# Patient Record
Sex: Female | Born: 1985 | Race: White | Hispanic: No | Marital: Married | Smoking: Current every day smoker
Health system: Southern US, Community
[De-identification: ages and names within clinical notes are randomized; demographics above are authoritative.]

## PROBLEM LIST (undated history)

## (undated) ENCOUNTER — Inpatient Hospital Stay: Payer: Self-pay

## (undated) DIAGNOSIS — K219 Gastro-esophageal reflux disease without esophagitis: Secondary | ICD-10-CM

## (undated) DIAGNOSIS — F419 Anxiety disorder, unspecified: Secondary | ICD-10-CM

## (undated) DIAGNOSIS — F319 Bipolar disorder, unspecified: Secondary | ICD-10-CM

## (undated) DIAGNOSIS — M549 Dorsalgia, unspecified: Secondary | ICD-10-CM

## (undated) DIAGNOSIS — I10 Essential (primary) hypertension: Secondary | ICD-10-CM

## (undated) DIAGNOSIS — I2089 Other forms of angina pectoris: Secondary | ICD-10-CM

## (undated) DIAGNOSIS — I341 Nonrheumatic mitral (valve) prolapse: Secondary | ICD-10-CM

## (undated) DIAGNOSIS — G8929 Other chronic pain: Secondary | ICD-10-CM

## (undated) DIAGNOSIS — I208 Other forms of angina pectoris: Secondary | ICD-10-CM

## (undated) HISTORY — PX: BACK SURGERY: SHX140

## (undated) HISTORY — PX: APPENDECTOMY: SHX54

## (undated) HISTORY — PX: KNEE ARTHROPLASTY: SHX992

## (undated) HISTORY — PX: POLYPECTOMY: SHX149

## (undated) HISTORY — PX: TONSILLECTOMY: SUR1361

---

## 2011-11-27 DIAGNOSIS — F1210 Cannabis abuse, uncomplicated: Secondary | ICD-10-CM | POA: Insufficient documentation

## 2012-07-11 DIAGNOSIS — M47812 Spondylosis without myelopathy or radiculopathy, cervical region: Secondary | ICD-10-CM | POA: Insufficient documentation

## 2012-07-11 DIAGNOSIS — M47814 Spondylosis without myelopathy or radiculopathy, thoracic region: Secondary | ICD-10-CM | POA: Insufficient documentation

## 2013-03-17 DIAGNOSIS — F418 Other specified anxiety disorders: Secondary | ICD-10-CM | POA: Insufficient documentation

## 2013-03-17 DIAGNOSIS — G43109 Migraine with aura, not intractable, without status migrainosus: Secondary | ICD-10-CM | POA: Insufficient documentation

## 2013-03-17 DIAGNOSIS — I10 Essential (primary) hypertension: Secondary | ICD-10-CM | POA: Insufficient documentation

## 2014-06-22 DIAGNOSIS — F29 Unspecified psychosis not due to a substance or known physiological condition: Secondary | ICD-10-CM | POA: Insufficient documentation

## 2014-06-22 DIAGNOSIS — F1120 Opioid dependence, uncomplicated: Secondary | ICD-10-CM | POA: Insufficient documentation

## 2015-01-16 DIAGNOSIS — F603 Borderline personality disorder: Secondary | ICD-10-CM | POA: Insufficient documentation

## 2015-03-08 DIAGNOSIS — M5442 Lumbago with sciatica, left side: Secondary | ICD-10-CM

## 2015-03-08 DIAGNOSIS — M5441 Lumbago with sciatica, right side: Secondary | ICD-10-CM

## 2015-03-08 DIAGNOSIS — G8929 Other chronic pain: Secondary | ICD-10-CM | POA: Insufficient documentation

## 2015-04-09 DIAGNOSIS — G894 Chronic pain syndrome: Secondary | ICD-10-CM | POA: Insufficient documentation

## 2015-04-09 DIAGNOSIS — F1120 Opioid dependence, uncomplicated: Secondary | ICD-10-CM | POA: Insufficient documentation

## 2015-04-09 DIAGNOSIS — F449 Dissociative and conversion disorder, unspecified: Secondary | ICD-10-CM | POA: Insufficient documentation

## 2015-04-09 DIAGNOSIS — F39 Unspecified mood [affective] disorder: Secondary | ICD-10-CM | POA: Insufficient documentation

## 2015-04-10 DIAGNOSIS — F17210 Nicotine dependence, cigarettes, uncomplicated: Secondary | ICD-10-CM | POA: Insufficient documentation

## 2015-04-10 DIAGNOSIS — Z8679 Personal history of other diseases of the circulatory system: Secondary | ICD-10-CM | POA: Insufficient documentation

## 2015-04-10 DIAGNOSIS — R000 Tachycardia, unspecified: Secondary | ICD-10-CM | POA: Insufficient documentation

## 2015-04-10 DIAGNOSIS — T404X2A Poisoning by other synthetic narcotics, intentional self-harm, initial encounter: Secondary | ICD-10-CM

## 2015-04-10 DIAGNOSIS — R45851 Suicidal ideations: Secondary | ICD-10-CM | POA: Insufficient documentation

## 2015-04-10 DIAGNOSIS — T40412A Poisoning by fentanyl or fentanyl analogs, intentional self-harm, initial encounter: Secondary | ICD-10-CM | POA: Insufficient documentation

## 2015-05-15 DIAGNOSIS — R404 Transient alteration of awareness: Secondary | ICD-10-CM | POA: Insufficient documentation

## 2015-08-08 DIAGNOSIS — M961 Postlaminectomy syndrome, not elsewhere classified: Secondary | ICD-10-CM | POA: Insufficient documentation

## 2015-09-03 DIAGNOSIS — F319 Bipolar disorder, unspecified: Secondary | ICD-10-CM | POA: Insufficient documentation

## 2015-10-04 DIAGNOSIS — R0789 Other chest pain: Secondary | ICD-10-CM | POA: Insufficient documentation

## 2016-01-16 DIAGNOSIS — J392 Other diseases of pharynx: Secondary | ICD-10-CM | POA: Insufficient documentation

## 2016-01-16 DIAGNOSIS — G8929 Other chronic pain: Secondary | ICD-10-CM | POA: Insufficient documentation

## 2016-01-16 DIAGNOSIS — R6884 Jaw pain: Secondary | ICD-10-CM

## 2016-06-19 DIAGNOSIS — M545 Low back pain, unspecified: Secondary | ICD-10-CM | POA: Insufficient documentation

## 2016-07-14 ENCOUNTER — Emergency Department
Admission: EM | Admit: 2016-07-14 | Discharge: 2016-07-14 | Disposition: A | Discharge status: Home / Self Care | Payer: Managed Care, Other (non HMO) | Attending: Student in an Organized Health Care Education/Training Program | Admitting: Student in an Organized Health Care Education/Training Program

## 2016-07-14 ENCOUNTER — Emergency Department: Payer: Managed Care, Other (non HMO)

## 2016-07-14 DIAGNOSIS — F17200 Nicotine dependence, unspecified, uncomplicated: Secondary | ICD-10-CM | POA: Insufficient documentation

## 2016-07-14 DIAGNOSIS — R079 Chest pain, unspecified: Secondary | ICD-10-CM | POA: Insufficient documentation

## 2016-07-14 DIAGNOSIS — I10 Essential (primary) hypertension: Secondary | ICD-10-CM | POA: Insufficient documentation

## 2016-07-14 HISTORY — DX: Essential (primary) hypertension: I10

## 2016-07-14 HISTORY — DX: Nonrheumatic mitral (valve) prolapse: I34.1

## 2016-07-14 HISTORY — DX: Other forms of angina pectoris: I20.8

## 2016-07-14 HISTORY — DX: Other forms of angina pectoris: I20.89

## 2016-07-14 LAB — CBC
HCT: 40.8 % (ref 35.0–47.0)
Hemoglobin: 14.2 g/dL (ref 12.0–16.0)
MCH: 31 pg (ref 26.0–34.0)
MCHC: 34.8 g/dL (ref 32.0–36.0)
MCV: 89 fL (ref 80.0–100.0)
PLATELETS: 341 10*3/uL (ref 150–440)
RBC: 4.58 MIL/uL (ref 3.80–5.20)
RDW: 14.6 % — AB (ref 11.5–14.5)
WBC: 11.4 10*3/uL — ABNORMAL HIGH (ref 3.6–11.0)

## 2016-07-14 LAB — BASIC METABOLIC PANEL
ANION GAP: 9 (ref 5–15)
BUN: 5 mg/dL — AB (ref 6–20)
CALCIUM: 9.5 mg/dL (ref 8.9–10.3)
CO2: 21 mmol/L — ABNORMAL LOW (ref 22–32)
CREATININE: 0.78 mg/dL (ref 0.44–1.00)
Chloride: 107 mmol/L (ref 101–111)
GFR calc Af Amer: >60 mL/min (ref >60)
GLUCOSE: 129 mg/dL — AB (ref 65–99)
Potassium: 3.4 mmol/L — ABNORMAL LOW (ref 3.5–5.1)
Sodium: 137 mmol/L (ref 135–145)

## 2016-07-14 LAB — TROPONIN I

## 2016-07-14 MED ORDER — ACETAMINOPHEN 325 MG PO TABS
650.0000 mg | ORAL_TABLET | Freq: Once | ORAL | Status: AC
  Administered 2016-07-14: 650 mg via ORAL
  Filled 2016-07-14: qty 2

## 2016-07-14 MED ORDER — MORPHINE SULFATE (PF) 4 MG/ML IV SOLN
8.0000 mg | INTRAVENOUS | Status: DC | PRN
  Administered 2016-07-14: 8 mg via INTRAMUSCULAR
  Filled 2016-07-14: qty 2

## 2016-07-14 MED ORDER — PROMETHAZINE HCL 25 MG/ML IJ SOLN
12.5000 mg | Freq: Once | INTRAMUSCULAR | Status: AC
  Administered 2016-07-14: 12.5 mg via INTRAMUSCULAR
  Filled 2016-07-14 (×2): qty 1

## 2016-07-14 MED ORDER — HYDROCODONE-ACETAMINOPHEN 5-325 MG PO TABS
1.0000 | ORAL_TABLET | Freq: Once | ORAL | Status: AC
  Administered 2016-07-14: 1 via ORAL
  Filled 2016-07-14: qty 1

## 2016-07-14 NOTE — ED Notes (Signed)
Pt discharged to home.  Family member driving.  Discharge instructions reviewed.  Verbalized understanding.  No questions or concerns at this time.  Teach back verified.  Pt in NAD.  No items left in ED.   

## 2016-07-14 NOTE — ED Notes (Signed)
Pt came to desk asking to go outside, IV removed, pt advised to stay in triage area for name to be called, pt states she is going outside

## 2016-07-14 NOTE — ED Notes (Signed)
Pt called RN to subwait, states left sided pain shooting up her chest, vitals taken, pt updated on labs, states she has her fentayl patch on and it is due to be seen but she wants pain medication, informed her will notified MD

## 2016-07-14 NOTE — ED Provider Notes (Signed)
[facility] Emergency Department Provider Note    First MD Initiated Contact with Patient 07/14/16 1947     (approximate)  I have reviewed the triage vital signs and the nursing notes.   HISTORY  Chief Complaint Chest Pain    HPI Breanna Mathews is a 31 y.o. female reported history of angina, high blood pressure and mitral valve prolapse presents with 1 day of chest pain radiating to her jaw and teeth into the top of her head associated with shortness of breath abdominal pain and diffuse myalgias. Denies any fevers. States the last time she had these symptoms they had to "shock" her.  Patient has been able to tolerate oral hydration. States that her fentanyl patch ran out at 3 and needs another one. Denies any diaphoresis. No worsening pain or shortness of breath when ambulating about the hall ways or out in triage. Patient was able to step out in no discomfort. Denies any lower extremity swelling. No orthopnea.   Past Medical History:  Diagnosis Date  . Angina of effort (HCC)   . Hypertension   . Mitral valve prolapse    No family history on file. History reviewed. No pertinent surgical history. There are no active problems to display for this patient.     Prior to Admission medications   Not on File    Allergies Keflex [cephalexin]; Lithium; and Opana [oxymorphone hcl]    Social History Social History  Substance Use Topics  . Smoking status: Current Every Day Smoker  . Smokeless tobacco: Never Used  . Alcohol use No    Review of Systems Patient denies headaches, rhinorrhea, blurry vision, numbness, shortness of breath, chest pain, edema, cough, abdominal pain, nausea, vomiting, diarrhea, dysuria, fevers, rashes or hallucinations unless otherwise stated above in HPI. ____________________________________________   PHYSICAL EXAM:  VITAL SIGNS: Vitals:   07/14/16 1606 07/14/16 1752  BP:  (!) 149/97  Pulse: (!) 104 89  Resp:  16    Temp:  98.5 F (36.9 C)    Constitutional: Alert and oriented. anxious appearing,in no acute distress. Eyes: Conjunctivae are normal. PERRL. EOMI. Head: Atraumatic. Nose: No congestion/rhinnorhea. Mouth/Throat: Mucous membranes are moist.  Oropharynx non-erythematous. Neck: No stridor. Painless ROM. No cervical spine tenderness to palpation Hematological/Lymphatic/Immunilogical: No cervical lymphadenopathy. Cardiovascular: Normal rate, regular rhythm. Grossly normal heart sounds.  Good peripheral circulation. Respiratory: Normal respiratory effort.  No retractions. Lungs CTAB. Gastrointestinal: Soft and nontender. No distention. No abdominal bruits. No CVA tenderness. Musculoskeletal: No lower extremity tenderness nor edema.  No joint effusions. Neurologic:  Normal speech and language. No gross focal neurologic deficits are appreciated. No gait instability. Skin:  Skin is warm, dry and intact. No rash noted.   ____________________________________________   LABS (all labs ordered are listed, but only abnormal results are displayed)  Results for orders placed or performed during the hospital encounter of 07/14/16 (from the past 24 hour(s))  Basic metabolic panel     Status: Abnormal   Collection Time: 07/14/16  3:04 PM  Result Value Ref Range   Sodium 137 135 - 145 mmol/L   Potassium 3.4 (L) 3.5 - 5.1 mmol/L   Chloride 107 101 - 111 mmol/L   CO2 21 (L) 22 - 32 mmol/L   Glucose, Bld 129 (H) 65 - 99 mg/dL   BUN 5 (L) 6 - 20 mg/dL   Creatinine, Ser 1.61 0.44 - 1.00 mg/dL   Calcium 9.5 8.9 - 09.6 mg/dL   GFR calc non Af Amer >60 >  60 mL/min   GFR calc Af Amer >60 >60 mL/min   Anion gap 9 5 - 15  CBC     Status: Abnormal   Collection Time: 07/14/16  3:04 PM  Result Value Ref Range   WBC 11.4 (H) 3.6 - 11.0 K/uL   RBC 4.58 3.80 - 5.20 MIL/uL   Hemoglobin 14.2 12.0 - 16.0 g/dL   HCT 16.1 09.6 - 04.5 %   MCV 89.0 80.0 - 100.0 fL   MCH 31.0 26.0 - 34.0 pg   MCHC 34.8 32.0 - 36.0  g/dL   RDW 40.9 (H) 81.1 - 91.4 %   Platelets 341 150 - 440 K/uL  Troponin I     Status: None   Collection Time: 07/14/16  3:04 PM  Result Value Ref Range   Troponin I <0.03 <0.03 ng/mL   ____________________________________________  EKG My review and personal interpretation at Time: 14:57   Indication: chest pain  Rate: -100  Rhythm: sinus Axis: normal Other: non specific st changes, no acute ischemia ____________________________________________  RADIOLOGY  I personally reviewed all radiographic images ordered to evaluate for the above acute complaints and reviewed radiology reports and findings.  These findings were personally discussed with the patient.  Please see medical record for radiology report.  ____________________________________________   PROCEDURES  Procedure(s) performed:  Procedures    Critical Care performed: no ____________________________________________   INITIAL IMPRESSION / ASSESSMENT AND PLAN / ED COURSE  Pertinent labs & imaging results that were available during my care of the patient were reviewed by me and considered in my medical decision making (see chart for details).  DDX: ACS, pericarditis, esophagitis, boerhaaves, pe, dissection, pna, bronchitis, costochondritis   Breanna Mathews is a 31 y.o. who presents to the ED with above described symptoms. Patient mildly tachycardic in triage. No hypoxia. Chest x-ray ordered to evaluate for pneumonia or heart failure shows no acute abnormality. EKG shows no dysrhythmia. Troponin is negative. Will order a repeat troponin to further risk stratify.  Patient is low risk by well's. I do not feel this is clinically consistent with pulmonary embolism. She has no hypoxia and complaining of multiple very vague discomforts. Likely muscular skeletal pain. Possible component of bronchitis as the patient does smoke.  Abdominal exam is soft and benign.  The patient will be placed on continuous pulse oximetry and  telemetry for monitoring.  Laboratory evaluation will be sent to evaluate for the above complaints.     Clinical Course as of Jul 14 2346  Tue Jul 14, 2016  2052 Repeat troponin is negative. Patient provided additional history that she recently moved and is very stressed out regarding regarding the recent move and increased stressors at home. States that she does feel safe at home.  Patient was able to tolerate PO and was able to ambulate with a steady gait.  Have discussed with the patient and available family all diagnostics and treatments performed thus far and all questions were answered to the best of my ability. The patient demonstrates understanding and agreement with plan.    [PR]    Clinical Course User Index [PR] Willy Eddy, MD     ____________________________________________   FINAL CLINICAL IMPRESSION(S) / ED DIAGNOSES  Final diagnoses:  Chest pain, unspecified type      NEW MEDICATIONS STARTED DURING THIS VISIT:  New Prescriptions   No medications on file     Note:  This document was prepared using Dragon voice recognition software and may include unintentional dictation errors.  Willy EddyPatrick Maryah Marinaro, MD 07/14/16 [phone]

## 2016-07-14 NOTE — ED Triage Notes (Signed)
Pt reports squeezing CP and tachycardia that began 30 minutes after awakening this AM. Pt came by [facility]. 20 G to R AC by [facility]. Pt alert and oriented X4, active, cooperative, pt in NAD. RR even and unlabored, color WNL.

## 2016-07-14 NOTE — ED Notes (Signed)
Pt ambulatory to STAT desk to inquire over wait time; no difficulty or distress noted; pt updated on wait and voices understanding; st that she is going outside to smoke; pt informed that this is a nonsmoking facility; pt st "going outside for some fresh air"; pt instructed that she needs to wait in the lobby in case her name is called to an exam room next, but cont to st that she is going outside

## 2016-07-15 ENCOUNTER — Encounter: Payer: Self-pay | Admitting: *Deleted

## 2016-07-15 ENCOUNTER — Emergency Department
Admission: EM | Admit: 2016-07-15 | Discharge: 2016-07-15 | Disposition: A | Discharge status: Home / Self Care | Payer: Managed Care, Other (non HMO) | Attending: Emergency Medicine | Admitting: Emergency Medicine

## 2016-07-15 ENCOUNTER — Emergency Department: Payer: Managed Care, Other (non HMO)

## 2016-07-15 DIAGNOSIS — Y999 Unspecified external cause status: Secondary | ICD-10-CM | POA: Diagnosis not present

## 2016-07-15 DIAGNOSIS — I10 Essential (primary) hypertension: Secondary | ICD-10-CM | POA: Diagnosis not present

## 2016-07-15 DIAGNOSIS — M25522 Pain in left elbow: Secondary | ICD-10-CM | POA: Diagnosis present

## 2016-07-15 DIAGNOSIS — Y939 Activity, unspecified: Secondary | ICD-10-CM | POA: Diagnosis not present

## 2016-07-15 DIAGNOSIS — S5012XA Contusion of left forearm, initial encounter: Secondary | ICD-10-CM | POA: Insufficient documentation

## 2016-07-15 DIAGNOSIS — Y929 Unspecified place or not applicable: Secondary | ICD-10-CM | POA: Diagnosis not present

## 2016-07-15 DIAGNOSIS — W109XXA Fall (on) (from) unspecified stairs and steps, initial encounter: Secondary | ICD-10-CM | POA: Insufficient documentation

## 2016-07-15 DIAGNOSIS — S5002XA Contusion of left elbow, initial encounter: Secondary | ICD-10-CM | POA: Diagnosis not present

## 2016-07-15 DIAGNOSIS — F17200 Nicotine dependence, unspecified, uncomplicated: Secondary | ICD-10-CM | POA: Insufficient documentation

## 2016-07-15 MED ORDER — HYDROMORPHONE HCL 1 MG/ML IJ SOLN
1.0000 mg | Freq: Once | INTRAMUSCULAR | Status: AC
  Administered 2016-07-15: 1 mg via INTRAMUSCULAR
  Filled 2016-07-15: qty 1

## 2016-07-15 MED ORDER — OXYCODONE-ACETAMINOPHEN 7.5-325 MG PO TABS: 1.0000 | ORAL_TABLET | Freq: Four times a day (QID) | ORAL | 0 refills | Status: DC | PRN

## 2016-07-15 MED ORDER — NAPROXEN 500 MG PO TABS: 500.0000 mg | ORAL_TABLET | Freq: Two times a day (BID) | ORAL | Status: DC

## 2016-07-15 MED ORDER — KETOROLAC TROMETHAMINE 60 MG/2ML IM SOLN
60.0000 mg | Freq: Once | INTRAMUSCULAR | Status: AC
  Administered 2016-07-15: 60 mg via INTRAMUSCULAR
  Filled 2016-07-15: qty 2

## 2016-07-15 MED ORDER — BACITRACIN ZINC 500 UNIT/GM EX OINT: TOPICAL_OINTMENT | Freq: Two times a day (BID) | CUTANEOUS | Status: DC

## 2016-07-15 NOTE — ED Provider Notes (Signed)
[facility] Emergency Department Provider Note   ____________________________________________   First MD Initiated Contact with Patient 07/15/16 1724     (approximate)  I have reviewed the triage vital signs and the nursing notes.   HISTORY  Chief Complaint Arm Pain    HPI Breanna Mathews Double is a 31 y.o. female patient complain of left forearm and elbow pain secondaryto a contusion. Patient state there. Dog directed down a flight of steps. Patient state abrasion, pain, and edema to the left forearm. Patient is right-hand dominant. Patient rates the pain as a 10 over 10. No palliative measures prior to arrival. Ice pack applied in triage. Patient described the pain as "achy". Patient is right-hand dominant.   Past Medical History:  Diagnosis Date  . Angina of effort (HCC)   . Hypertension   . Mitral valve prolapse     There are no active problems to display for this patient.   History reviewed. No pertinent surgical history.  Prior to Admission medications   Medication Sig Start Date End Date Taking? Authorizing Provider  naproxen (NAPROSYN) 500 MG tablet Take 1 tablet (500 mg total) by mouth 2 (two) times daily with a meal. 07/15/16   Joni Reiningonald K Encarnacion Scioneaux, PA-C  oxyCODONE-acetaminophen (PERCOCET) 7.5-325 MG tablet Take 1 tablet by mouth every 6 (six) hours as needed for severe pain. 07/15/16   Joni Reiningonald K Julienne Vogler, PA-C    Allergies Keflex [cephalexin]; Lithium; and Opana [oxymorphone hcl]  History reviewed. No pertinent family history.  Social History Social History  Substance Use Topics  . Smoking status: Current Every Day Smoker  . Smokeless tobacco: Never Used  . Alcohol use No    Review of Systems Constitutional: No fever/chills Eyes: No visual changes. ENT: No sore throat. Cardiovascular: Denies chest pain. Respiratory: Denies shortness of breath. Gastrointestinal: No abdominal pain.  No nausea, no vomiting.  No diarrhea.  No  constipation. Genitourinary: Negative for dysuria. Musculoskeletal: Negative for back pain. Skin: Negative for rash. Neurological: Negative for headaches, focal weakness or numbness. Endocrine:Hypertension Allergic/Immunilogical: See medication list ____________________________________________   PHYSICAL EXAM:  VITAL SIGNS: ED Triage Vitals  Enc Vitals Group     BP 07/15/16 1603 (!) 178/109     Pulse Rate 07/15/16 1603 (!) 131     Resp 07/15/16 1603 20     Temp 07/15/16 1603 98 F (36.7 C)     Temp Source 07/15/16 1603 Oral     SpO2 07/15/16 1603 98 %     Weight 07/15/16 1604 224 lb (101.6 kg)     Height 07/15/16 1604 5\' 9"  (1.753 m)     Head Circumference --      Peak Flow --      Pain Score 07/15/16 1609 10     Pain Loc --      Pain Edu? --      Excl. in GC? --     Constitutional: Alert and oriented. Well appearing and in no acute distress. Eyes: Conjunctivae are normal. PERRL. EOMI. Head: Atraumatic. Nose: No congestion/rhinnorhea. Mouth/Throat: Mucous membranes are moist.  Oropharynx non-erythematous. Neck: No stridor.  No cervical spine tenderness to palpation. Hematological/Lymphatic/Immunilogical: No cervical lymphadenopathy. Cardiovascular: Normal rate, regular rhythm. Grossly normal heart sounds.  Good peripheral circulation. Elevated blood pressure. Patient states she took her blood pressure medicine today. Medicine consist of lisinopril and HCTZ. Respiratory: Normal respiratory effort.  No retractions. Lungs CTAB. Gastrointestinal: Soft and nontender. No distention. No abdominal bruits. No CVA tenderness. Musculoskeletal: No obvious deformity  to the left forearm. There is mild edema.  Neurologic:  Normal speech and language. No gross focal neurologic deficits are appreciated. No gait instability. Skin:  Skin is warm, dry and intact. No rash noted. Abrasion to the ulnar aspect of the left forearm. Psychiatric: Mood and affect are normal. Speech and behavior are  normal.  ____________________________________________   LABS (all labs ordered are listed, but only abnormal results are displayed)  Labs Reviewed - No data to display ____________________________________________  EKG   ____________________________________________  RADIOLOGY  No acute findings x-ray of the left forearm or elbow. ____________________________________________   PROCEDURES  Procedure(s) performed: None  Procedures  Critical Care performed: No  ____________________________________________   INITIAL IMPRESSION / ASSESSMENT AND PLAN / ED COURSE  Pertinent labs & imaging results that were available during my care of the patient were reviewed by me and considered in my medical decision making (see chart for details).  Contusion to the left elbow and forearm. Patient given discharge care instructions. Patient given a prescription for Percocet and naproxen. Patient by the follow-up family clinic if condition persists.  Clinical Course      ____________________________________________   FINAL CLINICAL IMPRESSION(S) / ED DIAGNOSES  Final diagnoses:  Contusion of elbow and forearm, left, initial encounter      NEW MEDICATIONS STARTED DURING THIS VISIT:  New Prescriptions   NAPROXEN (NAPROSYN) 500 MG TABLET    Take 1 tablet (500 mg total) by mouth 2 (two) times daily with a meal.   OXYCODONE-ACETAMINOPHEN (PERCOCET) 7.5-325 MG TABLET    Take 1 tablet by mouth every 6 (six) hours as needed for severe pain.     Note:  This document was prepared using Dragon voice recognition software and may include unintentional dictation errors.    Joni Reining, PA-C 07/15/16 1739    Joni Reining, PA-C 07/15/16 1741    Myrna Blazer, MD 07/15/16 [phone]

## 2016-07-15 NOTE — Discharge Instructions (Signed)
Wear arm sling for 3-5 days as needed. °

## 2016-07-15 NOTE — ED Triage Notes (Signed)
States left arm pain after a dog dragged her down a few steps today, abrasions on left arm

## 2016-07-21 ENCOUNTER — Emergency Department: Payer: Managed Care, Other (non HMO)

## 2016-07-21 ENCOUNTER — Emergency Department
Admission: EM | Admit: 2016-07-21 | Discharge: 2016-07-21 | Disposition: A | Discharge status: Home / Self Care | Payer: Managed Care, Other (non HMO) | Attending: Emergency Medicine | Admitting: Emergency Medicine

## 2016-07-21 DIAGNOSIS — S4992XD Unspecified injury of left shoulder and upper arm, subsequent encounter: Secondary | ICD-10-CM | POA: Insufficient documentation

## 2016-07-21 DIAGNOSIS — W109XXD Fall (on) (from) unspecified stairs and steps, subsequent encounter: Secondary | ICD-10-CM | POA: Diagnosis not present

## 2016-07-21 DIAGNOSIS — M79602 Pain in left arm: Secondary | ICD-10-CM | POA: Diagnosis present

## 2016-07-21 DIAGNOSIS — I10 Essential (primary) hypertension: Secondary | ICD-10-CM | POA: Insufficient documentation

## 2016-07-21 MED ORDER — KETOROLAC TROMETHAMINE 60 MG/2ML IM SOLN
60.0000 mg | Freq: Once | INTRAMUSCULAR | Status: AC
  Administered 2016-07-21: 60 mg via INTRAMUSCULAR
  Filled 2016-07-21: qty 2

## 2016-07-21 MED ORDER — OXYCODONE-ACETAMINOPHEN 5-325 MG PO TABS: 1.0000 | ORAL_TABLET | ORAL | 0 refills | Status: DC | PRN

## 2016-07-21 MED ORDER — LIDOCAINE 5 % EX PTCH
1.0000 | MEDICATED_PATCH | CUTANEOUS | Status: DC
  Administered 2016-07-21: 1 via TRANSDERMAL
  Filled 2016-07-21: qty 1

## 2016-07-21 MED ORDER — LIDOCAINE 5 % EX PTCH: 1.0000 | MEDICATED_PATCH | Freq: Two times a day (BID) | CUTANEOUS | 0 refills | Status: AC

## 2016-07-21 NOTE — ED Triage Notes (Signed)
Pt states she fell on arm 3 days ago and saw ED provider that day.  Pt was told that there was no break at that time and to ice arm and keep in sling.  Pt states that pain is worse and she cannot sleep.  Pt also states that she has not taken any OTC medication because she states that she cannot afford it at this time.  Pt also states that swelling has gotten worse.  Pt denies being referred to an orthopedic.  Pt A&Ox4 and in NAD.

## 2016-07-21 NOTE — ED Notes (Signed)
Pt c/o pain meds, states she needs more. Pt informed that pt is to follow up with pain clinic and [facility]. Provider ntofiied

## 2016-07-21 NOTE — ED Provider Notes (Signed)
[facility] Emergency Department Provider Note  ____________________________________________  Time seen: Approximately 9:16 PM  I have reviewed the triage vital signs and the nursing notes.   HISTORY  Chief Complaint Arm Injury    HPI Breanna Mathews is a 31 y.o. female presents to the emergency department with left arm pain for 6 days. She states that she tripped over the dog and fell down the stairs onto left arm. Patient states she was seen in the emergency department and was told the arm is not broken but pain is only getting worse. Patient states that arm is extremely tender to touch. Patient is unable to move her wrist in any direction. Patient has been taking Percocet for pain.   Past Medical History:  Diagnosis Date  . Angina of effort (HCC)   . Hypertension   . Mitral valve prolapse     There are no active problems to display for this patient.   History reviewed. No pertinent surgical history.  Prior to Admission medications   Medication Sig Start Date End Date Taking? Authorizing Provider  lidocaine (LIDODERM) 5 % Place 1 patch onto the skin every 12 (twelve) hours. Remove & Discard patch within 12 hours or as directed by MD 07/21/16 07/21/17  Enid Derry, PA-C  naproxen (NAPROSYN) 500 MG tablet Take 1 tablet (500 mg total) by mouth 2 (two) times daily with a meal. 07/15/16   Joni Reining, PA-C  oxyCODONE-acetaminophen (PERCOCET) 7.5-325 MG tablet Take 1 tablet by mouth every 6 (six) hours as needed for severe pain. 07/15/16   Joni Reining, PA-C  oxyCODONE-acetaminophen (ROXICET) 5-325 MG tablet Take 1 tablet by mouth every 4 (four) hours as needed for severe pain. 07/21/16   Enid Derry, PA-C    Allergies Keflex [cephalexin]; Lithium; and Opana [oxymorphone hcl]  No family history on file.  Social History Social History  Substance Use Topics  . Smoking status: Current Every Day Smoker  . Smokeless tobacco: Never Used  . Alcohol  use No     Review of Systems  Constitutional: No fever/chills ENT: No upper respiratory complaints. Cardiovascular: No chest pain. Respiratory: No cough. No SOB. Gastrointestinal: No abdominal pain.  No nausea, no vomiting.  Musculoskeletal: Negative for musculoskeletal pain. Skin: Negative for abrasions, lacerations,  Neurological: Negative for headaches, numbness or tingling   ____________________________________________   PHYSICAL EXAM:  VITAL SIGNS: ED Triage Vitals  Enc Vitals Group     BP 07/21/16 1938 (!) 154/95     Pulse Rate 07/21/16 1938 96     Resp 07/21/16 1938 18     Temp 07/21/16 1938 98.2 F (36.8 C)     Temp Source 07/21/16 1938 Oral     SpO2 07/21/16 1938 98 %     Weight 07/21/16 1938 224 lb (101.6 kg)     Height 07/21/16 1938 5' 9.5" (1.765 m)     Head Circumference --      Peak Flow --      Pain Score 07/21/16 1944 9     Pain Loc --      Pain Edu? --      Excl. in GC? --      Constitutional: Alert and oriented. Well appearing and in no acute distress. Eyes: Conjunctivae are normal. PERRL. EOMI. Head: Atraumatic. ENT:      Ears:      Nose: No congestion/rhinnorhea.      Mouth/Throat: Mucous membranes are moist.  Neck: No stridor.  Cardiovascular: Normal rate, regular  rhythm. Normal S1 and S2.  Good peripheral circulation. 2+ radial pulses. Respiratory: Normal respiratory effort without tachypnea or retractions. Lungs CTAB. Good air entry to the bases with no decreased or absent breath sounds. Musculoskeletal: Tenderness to palpation over entire right forearm. Patient states that it is painful to extend, flex, duct or abduct her wrist. No gross deformities appreciated. Neurologic:  Normal speech and language. No gross focal neurologic deficits are appreciated.  Skin:  Skin is warm, dry and intact. Mild swelling and bruising over forearm. Psychiatric: Mood and affect are normal. Speech and behavior are normal. Patient exhibits appropriate insight  and judgement.   ____________________________________________   LABS (all labs ordered are listed, but only abnormal results are displayed)  Labs Reviewed - No data to display ____________________________________________  EKG   ____________________________________________  RADIOLOGY Lexine BatonI, Saquan Furtick, personally viewed and evaluated these images (plain radiographs) as part of my medical decision making, as well as reviewing the written report by the radiologist.  Dg Forearm Left  Result Date: 07/21/2016 CLINICAL DATA:  Injury.  Persistent pain EXAM: LEFT FOREARM - 2 VIEW COMPARISON:  07/15/2016 FINDINGS: There is no evidence of fracture or other focal bone lesions. Soft tissues are unremarkable. IMPRESSION: Negative. Electronically Signed   By: Marlan Palauharles  Clark M.D.   On: 07/21/2016 19:43    ____________________________________________    PROCEDURES  Procedure(s) performed:    Procedures    Medications  lidocaine (LIDODERM) 5 % 1 patch (1 patch Transdermal Patch Applied 07/21/16 2111)  ketorolac (TORADOL) injection 60 mg (60 mg Intramuscular Given 07/21/16 2035)     ____________________________________________   INITIAL IMPRESSION / ASSESSMENT AND PLAN / ED COURSE  Pertinent labs & imaging results that were available during my care of the patient were reviewed by me and considered in my medical decision making (see chart for details).  Review of the [place] CSRS was performed in accordance of the [facility] prior to dispensing any controlled drugs.  Clinical Course     Patient's diagnosis is consistent with arm injury. No acute bony abnormality seen on x-ray. Patient will be discharged home with prescriptions for Lidoderm patches and a very limited supply of Roxicet. Education was provided. Patient is to follow up with pain clinic and PCP as directed. Patient is given ED precautions to return to the ED for any worsening or new  symptoms.     ____________________________________________  FINAL CLINICAL IMPRESSION(S) / ED DIAGNOSES  Final diagnoses:  Injury of left upper extremity, subsequent encounter      NEW MEDICATIONS STARTED DURING THIS VISIT:  Discharge Medication List as of 07/21/2016  9:02 PM    START taking these medications   Details  lidocaine (LIDODERM) 5 % Place 1 patch onto the skin every 12 (twelve) hours. Remove & Discard patch within 12 hours or as directed by MD, Starting Tue 07/21/2016, Until Wed 07/21/2017, Print    oxyCODONE-acetaminophen (ROXICET) 5-325 MG tablet Take 1 tablet by mouth every 4 (four) hours as needed for severe pain., Starting Tue 07/21/2016, Print            This chart was dictated using voice recognition software/Dragon. Despite best efforts to proofread, errors can occur which can change the meaning. Any change was purely unintentional.    Enid DerryAshley Domenik Trice, PA-C 07/21/16 [id number]    Minna AntisKevin Paduchowski, MD 07/25/16 [phone]

## 2016-07-30 DIAGNOSIS — Z79891 Long term (current) use of opiate analgesic: Secondary | ICD-10-CM | POA: Insufficient documentation

## 2016-07-30 DIAGNOSIS — Z765 Malingerer [conscious simulation]: Secondary | ICD-10-CM | POA: Insufficient documentation

## 2016-08-16 ENCOUNTER — Emergency Department
Admission: EM | Admit: 2016-08-16 | Discharge: 2016-08-16 | Disposition: A | Discharge status: Home / Self Care | Payer: Managed Care, Other (non HMO) | Attending: Emergency Medicine | Admitting: Emergency Medicine

## 2016-08-16 ENCOUNTER — Encounter: Payer: Self-pay | Admitting: Emergency Medicine

## 2016-08-16 DIAGNOSIS — F17200 Nicotine dependence, unspecified, uncomplicated: Secondary | ICD-10-CM | POA: Diagnosis not present

## 2016-08-16 DIAGNOSIS — G8929 Other chronic pain: Secondary | ICD-10-CM | POA: Insufficient documentation

## 2016-08-16 DIAGNOSIS — R45851 Suicidal ideations: Secondary | ICD-10-CM | POA: Diagnosis present

## 2016-08-16 DIAGNOSIS — F410 Panic disorder [episodic paroxysmal anxiety] without agoraphobia: Secondary | ICD-10-CM

## 2016-08-16 DIAGNOSIS — I10 Essential (primary) hypertension: Secondary | ICD-10-CM | POA: Insufficient documentation

## 2016-08-16 DIAGNOSIS — M549 Dorsalgia, unspecified: Secondary | ICD-10-CM | POA: Diagnosis not present

## 2016-08-16 DIAGNOSIS — Z79899 Other long term (current) drug therapy: Secondary | ICD-10-CM | POA: Diagnosis not present

## 2016-08-16 HISTORY — DX: Dorsalgia, unspecified: M54.9

## 2016-08-16 HISTORY — DX: Anxiety disorder, unspecified: F41.9

## 2016-08-16 HISTORY — DX: Other chronic pain: G89.29

## 2016-08-16 HISTORY — DX: Bipolar disorder, unspecified: F31.9

## 2016-08-16 LAB — COMPREHENSIVE METABOLIC PANEL
ALT: 19 U/L (ref 14–54)
ANION GAP: 8 (ref 5–15)
AST: 20 U/L (ref 15–41)
Albumin: 3.9 g/dL (ref 3.5–5.0)
Alkaline Phosphatase: 62 U/L (ref 38–126)
BUN: 8 mg/dL (ref 6–20)
CALCIUM: 9.3 mg/dL (ref 8.9–10.3)
CHLORIDE: 109 mmol/L (ref 101–111)
CO2: 22 mmol/L (ref 22–32)
Creatinine, Ser: 0.82 mg/dL (ref 0.44–1.00)
GFR calc non Af Amer: >60 mL/min (ref >60)
Glucose, Bld: 104 mg/dL — ABNORMAL HIGH (ref 65–99)
POTASSIUM: 3.4 mmol/L — AB (ref 3.5–5.1)
Sodium: 139 mmol/L (ref 135–145)
Total Bilirubin: 0.3 mg/dL (ref 0.3–1.2)
Total Protein: 7.1 g/dL (ref 6.5–8.1)

## 2016-08-16 LAB — CBC WITH DIFFERENTIAL/PLATELET
BASOS PCT: 1 %
Basophils Absolute: 0.1 10*3/uL (ref 0–0.1)
EOS ABS: 0.1 10*3/uL (ref 0–0.7)
EOS PCT: 1 %
HCT: 40.1 % (ref 35.0–47.0)
Hemoglobin: 13.4 g/dL (ref 12.0–16.0)
LYMPHS ABS: 3.4 10*3/uL (ref 1.0–3.6)
Lymphocytes Relative: 18 %
MCH: 29.9 pg (ref 26.0–34.0)
MCHC: 33.3 g/dL (ref 32.0–36.0)
MCV: 89.6 fL (ref 80.0–100.0)
MONO ABS: 0.9 10*3/uL (ref 0.2–0.9)
MONOS PCT: 5 %
Neutro Abs: 14.4 10*3/uL — ABNORMAL HIGH (ref 1.4–6.5)
Neutrophils Relative %: 75 %
PLATELETS: 294 10*3/uL (ref 150–440)
RBC: 4.48 MIL/uL (ref 3.80–5.20)
RDW: 14.6 % — AB (ref 11.5–14.5)
WBC: 19 10*3/uL — ABNORMAL HIGH (ref 3.6–11.0)

## 2016-08-16 LAB — SALICYLATE LEVEL

## 2016-08-16 LAB — ACETAMINOPHEN LEVEL: Acetaminophen (Tylenol), Serum: <10 ug/mL — ABNORMAL LOW (ref 10–30)

## 2016-08-16 LAB — ETHANOL

## 2016-08-16 MED ORDER — HALOPERIDOL LACTATE 5 MG/ML IJ SOLN
2.0000 mg | Freq: Once | INTRAMUSCULAR | Status: AC
  Administered 2016-08-16: 2 mg via INTRAVENOUS
  Filled 2016-08-16: qty 1
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
## 2016-08-16 NOTE — Discharge Instructions (Signed)
These contact your specialty physicians in the morning for follow-up as soon as possible. Please continue current medication regimen. Return to emergency department if he have any suicidal thoughts, homicidal thoughts, or hallucinations.  Please return immediately if condition worsens. Please contact her primary physician or the physician you were given for referral. If you have any specialist physicians involved in her treatment and plan please also contact them. Thank you for using [facility].

## 2016-08-16 NOTE — ED Notes (Signed)
Pt given sandwich tray and sprite.  

## 2016-08-16 NOTE — ED Triage Notes (Signed)
Ems called out for panic attack - pt given versed 2 mg by ems and is no longer anxious but states suicidal.
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
## 2016-08-16 NOTE — ED Notes (Signed)
Per soc pt will be discharged - awaiting reversal of ivc

## 2016-10-02 ENCOUNTER — Emergency Department
Admission: EM | Admit: 2016-10-02 | Discharge: 2016-10-02 | Disposition: A | Discharge status: Home / Self Care | Payer: Managed Care, Other (non HMO) | Attending: Emergency Medicine | Admitting: Emergency Medicine

## 2016-10-02 ENCOUNTER — Emergency Department: Payer: Managed Care, Other (non HMO)

## 2016-10-02 DIAGNOSIS — Z79899 Other long term (current) drug therapy: Secondary | ICD-10-CM | POA: Diagnosis not present

## 2016-10-02 DIAGNOSIS — F17200 Nicotine dependence, unspecified, uncomplicated: Secondary | ICD-10-CM | POA: Insufficient documentation

## 2016-10-02 DIAGNOSIS — Z3A08 8 weeks gestation of pregnancy: Secondary | ICD-10-CM | POA: Insufficient documentation

## 2016-10-02 DIAGNOSIS — N9489 Other specified conditions associated with female genital organs and menstrual cycle: Secondary | ICD-10-CM | POA: Diagnosis not present

## 2016-10-02 DIAGNOSIS — O99331 Smoking (tobacco) complicating pregnancy, first trimester: Secondary | ICD-10-CM | POA: Diagnosis not present

## 2016-10-02 DIAGNOSIS — O209 Hemorrhage in early pregnancy, unspecified: Secondary | ICD-10-CM | POA: Diagnosis not present

## 2016-10-02 DIAGNOSIS — I10 Essential (primary) hypertension: Secondary | ICD-10-CM | POA: Insufficient documentation

## 2016-10-02 DIAGNOSIS — N939 Abnormal uterine and vaginal bleeding, unspecified: Secondary | ICD-10-CM

## 2016-10-02 LAB — BASIC METABOLIC PANEL
Anion gap: 9 (ref 5–15)
BUN: 8 mg/dL (ref 6–20)
CALCIUM: 8.8 mg/dL — AB (ref 8.9–10.3)
CHLORIDE: 104 mmol/L (ref 101–111)
CO2: 22 mmol/L (ref 22–32)
CREATININE: 0.72 mg/dL (ref 0.44–1.00)
GFR calc non Af Amer: >60 mL/min (ref >60)
Glucose, Bld: 102 mg/dL — ABNORMAL HIGH (ref 65–99)
Potassium: 3.5 mmol/L (ref 3.5–5.1)
SODIUM: 135 mmol/L (ref 135–145)

## 2016-10-02 LAB — WET PREP, GENITAL
SPERM: NONE SEEN
Trich, Wet Prep: NONE SEEN
YEAST WET PREP: NONE SEEN

## 2016-10-02 LAB — CBC
HCT: 41.9 % (ref 35.0–47.0)
HEMOGLOBIN: 14.5 g/dL (ref 12.0–16.0)
MCH: 30.5 pg (ref 26.0–34.0)
MCHC: 34.7 g/dL (ref 32.0–36.0)
MCV: 87.9 fL (ref 80.0–100.0)
PLATELETS: 309 10*3/uL (ref 150–440)
RBC: 4.77 MIL/uL (ref 3.80–5.20)
RDW: 13.5 % (ref 11.5–14.5)
WBC: 13.6 10*3/uL — ABNORMAL HIGH (ref 3.6–11.0)

## 2016-10-02 LAB — CHLAMYDIA/NGC RT PCR (ARMC ONLY)
Chlamydia Tr: NOT DETECTED
N gonorrhoeae: NOT DETECTED

## 2016-10-02 LAB — HCG, QUANTITATIVE, PREGNANCY: HCG, BETA CHAIN, QUANT, S: 65578 m[IU]/mL — AB (ref <5)

## 2016-10-02 NOTE — ED Triage Notes (Addendum)
Pt recently found out she is pregnant, unsure of gestation. States noted brownish discharge today with some spotting. Co abd cramping tonight, no dysuria. [id number]

## 2016-10-02 NOTE — ED Provider Notes (Signed)
[facility] Emergency Department Provider Note   First MD Initiated Contact with Patient 10/02/16 0302     (approximate)  I have reviewed the triage vital signs and the nursing notes.   HISTORY  Chief Complaint Vaginal Bleeding    HPI Breanna Mathews is a 31 y.o. female G5 P2 miscarriages 2 with below this of chronic medical conditions presents to the emergency department with pelvic cramping and vaginal spotting (brownish old blood) with onset yesterday. Of note patient states last sexual intercourse was yesterday with symptoms starting thereafter. Patient denies any fever no urinary symptoms.   Past Medical History:  Diagnosis Date  . Angina of effort (HCC)   . Anxiety   . Bipolar disorder with depression (HCC)   . Chronic back pain   . Hypertension   . Mitral valve prolapse     There are no active problems to display for this patient.   Past Surgical History:  Procedure Laterality Date  . BACK SURGERY      Prior to Admission medications   Medication Sig Start Date End Date Taking? Authorizing Provider  lidocaine (LIDODERM) 5 % Place 1 patch onto the skin every 12 (twelve) hours. Remove & Discard patch within 12 hours or as directed by MD 07/21/16 07/21/17  Enid Derry, PA-C  naproxen (NAPROSYN) 500 MG tablet Take 1 tablet (500 mg total) by mouth 2 (two) times daily with a meal. 07/15/16   Joni Reining, PA-C  oxyCODONE-acetaminophen (PERCOCET) 7.5-325 MG tablet Take 1 tablet by mouth every 6 (six) hours as needed for severe pain. 07/15/16   Joni Reining, PA-C  oxyCODONE-acetaminophen (ROXICET) 5-325 MG tablet Take 1 tablet by mouth every 4 (four) hours as needed for severe pain. 07/21/16   Enid Derry, PA-C    Allergies Keflex [cephalexin]; Lithium; and Opana [oxymorphone hcl]  No family history on file.  Social History Social History  Substance Use Topics  . Smoking status: Current Every Day Smoker  . Smokeless tobacco: Never Used   . Alcohol use No    Review of Systems Constitutional: No fever/chills Eyes: No visual changes. ENT: No sore throat. Cardiovascular: Denies chest pain. Respiratory: Denies shortness of breath. Gastrointestinal: No abdominal pain.  No nausea, no vomiting.  No diarrhea.  No constipation. Genitourinary: Negative for dysuria. Positive for vaginal spotting Musculoskeletal: Negative for back pain. Skin: Negative for rash. Neurological: Negative for headaches, focal weakness or numbness.  10-point ROS otherwise negative.  ____________________________________________   PHYSICAL EXAM:  VITAL SIGNS: ED Triage Vitals  Enc Vitals Group     BP 10/02/16 0033 (!) 174/95     Pulse Rate 10/02/16 0033 90     Resp 10/02/16 0033 18     Temp 10/02/16 0033 98.3 F (36.8 C)     Temp Source 10/02/16 0033 Oral     SpO2 10/02/16 0033 100 %     Weight 10/02/16 0030 215 lb (97.5 kg)     Height 10/02/16 0030  (1.778 m)     Head Circumference --      Peak Flow --      Pain Score 10/02/16 0030 4     Pain Loc --      Pain Edu? --      Excl. in GC? --     Constitutional: Alert and oriented. Well appearing and in no acute distress. Eyes: Conjunctivae are normal. PERRL. EOMI. Head: Atraumatic. Mouth/Throat: Mucous membranes are moist. Oropharynx non-erythematous. Neck: No stridor.  Cardiovascular: Normal rate, regular rhythm. Good peripheral circulation. Grossly normal heart sounds. Respiratory: Normal respiratory effort.  No retractions. Lungs CTAB. Gastrointestinal: Soft and nontender. No distention.  Genitourinary: Scant dark blood noted vaginal vault Musculoskeletal: No lower extremity tenderness nor edema. No gross deformities of extremities. Neurologic:  Normal speech and language. No gross focal neurologic deficits are appreciated.  Skin:  Skin is warm, dry and intact. No rash noted. Psychiatric: Mood and affect are normal. Speech and behavior are  normal.  ____________________________________________   LABS (all labs ordered are listed, but only abnormal results are displayed)  Labs Reviewed  CBC - Abnormal; Notable for the following:       Result Value   WBC 13.6 (*)    All other components within normal limits  BASIC METABOLIC PANEL - Abnormal; Notable for the following:    Glucose, Bld 102 (*)    Calcium 8.8 (*)    All other components within normal limits  HCG, QUANTITATIVE, PREGNANCY - Abnormal; Notable for the following:    hCG, Beta Chain, Quant, S 65,578 (*)    All other components within normal limits  URINALYSIS, COMPLETE (UACMP) WITH MICROSCOPIC     RADIOLOGY I, [place] Salli Bodin, personally viewed and evaluated these images (plain radiographs) as part of my medical decision making, as well as reviewing the written report by the radiologist.  US Ob Comp Less 14 Wks  Result Date: 10/02/2016 CLINICAL DATA:  Vaginal bleeding. EXAM: OBSTETRIC <14 WK [place] AND TRANSVAGINAL OB US TECHNIQUE: Both transabdominal and transvaginal ultrasound examinations were performed for complete evaluation of the gestation as well as the maternal uterus, adnexal regions, and pelvic cul-de-sac. Transvaginal technique was performed to assess early pregnancy. COMPARISON:  None. FINDINGS: Intrauterine gestational sac: Single Yolk sac:  Visualized. Embryo:  Visualized. Cardiac Activity: Visualized. Heart Rate: 171  bpm CRL:  17.1  mm   8 w   1 d                  [place] EDC: 05/13/2017 Subchorionic hemorrhage:  None visualized. Maternal uterus/adnexae: Normal. IMPRESSION: Single live intrauterine pregnancy with estimated gestational age of [redacted] weeks and 1 day by crown-rump length. Electronically Signed   By: Mitzi Hansen M.D.   On: 10/02/2016 02:20   US Ob Transvaginal  Result Date: 10/02/2016 CLINICAL DATA:  Vaginal bleeding. EXAM: OBSTETRIC <14 WK [place] AND TRANSVAGINAL OB US TECHNIQUE: Both transabdominal and transvaginal ultrasound  examinations were performed for complete evaluation of the gestation as well as the maternal uterus, adnexal regions, and pelvic cul-de-sac. Transvaginal technique was performed to assess early pregnancy. COMPARISON:  None. FINDINGS: Intrauterine gestational sac: Single Yolk sac:  Visualized. Embryo:  Visualized. Cardiac Activity: Visualized. Heart Rate: 171  bpm CRL:  17.1  mm   8 w   1 d                  [place] EDC: 05/13/2017 Subchorionic hemorrhage:  None visualized. Maternal uterus/adnexae: Normal. IMPRESSION: Single live intrauterine pregnancy with estimated gestational age of [redacted] weeks and 1 day by crown-rump length. Electronically Signed   By: Mitzi Hansen M.D.   On: 10/02/2016 02:20      Procedures   ____________________________________________   INITIAL IMPRESSION / ASSESSMENT AND PLAN / ED COURSE  Pertinent labs & imaging results that were available during my care of the patient were reviewed by me and considered in my medical decision making (see chart for details).  Patient went pelvic cramping and vaginal  spotting in first trimester pregnancy. Ultrasound revealed single live IUP 8 weeks 1 day. Spoke with patient at length regarding possibility of early miscarriage and a such patient is referred to OB/GYN for follow-up on Monday. Patient advised to return to emergency department immediately for worsening pain or vaginal bleeding.      ____________________________________________  FINAL CLINICAL IMPRESSION(S) / ED DIAGNOSES  Final diagnoses:  Vaginal bleeding     MEDICATIONS GIVEN DURING THIS VISIT:  Medications - No data to display   NEW OUTPATIENT MEDICATIONS STARTED DURING THIS VISIT:  New Prescriptions   No medications on file    Modified Medications   No medications on file    Discontinued Medications   No medications on file     Note:  This document was prepared using Dragon voice recognition software and may include unintentional dictation  errors.    Darci Current, MD 10/02/16 [phone]

## 2016-10-04 ENCOUNTER — Emergency Department
Admission: EM | Admit: 2016-10-04 | Discharge: 2016-10-04 | Disposition: A | Discharge status: Home / Self Care | Payer: Managed Care, Other (non HMO) | Attending: Emergency Medicine | Admitting: Emergency Medicine

## 2016-10-04 ENCOUNTER — Encounter: Payer: Self-pay | Admitting: Emergency Medicine

## 2016-10-04 DIAGNOSIS — O99331 Smoking (tobacco) complicating pregnancy, first trimester: Secondary | ICD-10-CM | POA: Diagnosis not present

## 2016-10-04 DIAGNOSIS — Z79899 Other long term (current) drug therapy: Secondary | ICD-10-CM | POA: Diagnosis not present

## 2016-10-04 DIAGNOSIS — N898 Other specified noninflammatory disorders of vagina: Secondary | ICD-10-CM | POA: Diagnosis not present

## 2016-10-04 DIAGNOSIS — F17200 Nicotine dependence, unspecified, uncomplicated: Secondary | ICD-10-CM | POA: Diagnosis not present

## 2016-10-04 DIAGNOSIS — O208 Other hemorrhage in early pregnancy: Secondary | ICD-10-CM | POA: Diagnosis not present

## 2016-10-04 DIAGNOSIS — Z3A08 8 weeks gestation of pregnancy: Secondary | ICD-10-CM | POA: Diagnosis not present

## 2016-10-04 DIAGNOSIS — I10 Essential (primary) hypertension: Secondary | ICD-10-CM | POA: Insufficient documentation

## 2016-10-04 DIAGNOSIS — O4690 Antepartum hemorrhage, unspecified, unspecified trimester: Secondary | ICD-10-CM

## 2016-10-04 LAB — CBC
HCT: 43.4 % (ref 35.0–47.0)
HEMOGLOBIN: 14.8 g/dL (ref 12.0–16.0)
MCH: 30.1 pg (ref 26.0–34.0)
MCHC: 34.1 g/dL (ref 32.0–36.0)
MCV: 88.2 fL (ref 80.0–100.0)
PLATELETS: 317 10*3/uL (ref 150–440)
RBC: 4.92 MIL/uL (ref 3.80–5.20)
RDW: 13.3 % (ref 11.5–14.5)
WBC: 13.6 10*3/uL — ABNORMAL HIGH (ref 3.6–11.0)

## 2016-10-04 LAB — HCG, QUANTITATIVE, PREGNANCY: HCG, BETA CHAIN, QUANT, S: 75821 m[IU]/mL — AB (ref <5)

## 2016-10-04 LAB — ABO/RH: ABO/RH(D): A POS

## 2016-10-04 NOTE — ED Triage Notes (Signed)
Pt was seen two days ago for vaginal bleeding in pregnancy - had [place] which showed 8 week 1 day live pregnancy. Pt came in today requesting another [place] to make sure baby is still alive since she is still bleeding.

## 2016-10-04 NOTE — ED Provider Notes (Signed)
[facility] Emergency Department Provider Note  ____________________________________________  Time seen: Approximately 8:28 PM  I have reviewed the triage vital signs and the nursing notes.   HISTORY  Chief Complaint Vaginal Bleeding   HPI Breanna Mathews is a 31 y.o. female G5P2 currently at [redacted] weeks GA per [place] on 10/02/16 who presents for evaluation of vaginal bleeding. Patient was seen here 2 days ago for vaginal bleeding and had an ultrasound showing an IUP at [redacted] weeks gestational age. Patient reports that she continues to bleed and have lower abdominal cramping. She reports that the pain is intermittent, moderate to severe, feels like a stronger menstrual cramp, it has been going on for 2 days. This is patient's fifth pregnancy. Patient has had 2 live births and 2 prior miscarriages. Patient also reports that she's been feeling lightheaded for the last few hours but no chest pain or shortness of breath.  Past Medical History:  Diagnosis Date  . Angina of effort (HCC)   . Anxiety   . Bipolar disorder with depression (HCC)   . Chronic back pain   . Hypertension   . Mitral valve prolapse     There are no active problems to display for this patient.   Past Surgical History:  Procedure Laterality Date  . BACK SURGERY      Prior to Admission medications   Medication Sig Start Date End Date Taking? Authorizing Provider  lidocaine (LIDODERM) 5 % Place 1 patch onto the skin every 12 (twelve) hours. Remove & Discard patch within 12 hours or as directed by MD 07/21/16 07/21/17  Enid Derry, PA-C  naproxen (NAPROSYN) 500 MG tablet Take 1 tablet (500 mg total) by mouth 2 (two) times daily with a meal. 07/15/16   Joni Reining, PA-C  oxyCODONE-acetaminophen (PERCOCET) 7.5-325 MG tablet Take 1 tablet by mouth every 6 (six) hours as needed for severe pain. 07/15/16   Joni Reining, PA-C  oxyCODONE-acetaminophen (ROXICET) 5-325 MG tablet Take 1 tablet by mouth  every 4 (four) hours as needed for severe pain. 07/21/16   Enid Derry, PA-C    Allergies Keflex [cephalexin]; Lithium; and Opana [oxymorphone hcl]  History reviewed. No pertinent family history.  Social History Social History  Substance Use Topics  . Smoking status: Current Every Day Smoker  . Smokeless tobacco: Never Used  . Alcohol use No    Review of Systems  Constitutional: Negative for fever. Eyes: Negative for visual changes. ENT: Negative for sore throat. Neck: No neck pain  Cardiovascular: Negative for chest pain. Respiratory: Negative for shortness of breath. Gastrointestinal: + lower abdominal pain. No vomiting or diarrhea. Genitourinary: Negative for dysuria. + vaginal bleeding Musculoskeletal: Negative for back pain. Skin: Negative for rash. Neurological: Negative for headaches, weakness or numbness. Psych: No SI or HI  ____________________________________________   PHYSICAL EXAM:  VITAL SIGNS: ED Triage Vitals  Enc Vitals Group     BP 10/04/16 1855 (!) 163/96     Pulse Rate 10/04/16 1855 86     Resp 10/04/16 1855 18     Temp 10/04/16 1855 97.8 F (36.6 C)     Temp Source 10/04/16 1855 Oral     SpO2 10/04/16 1855 98 %     Weight 10/04/16 1856 215 lb (97.5 kg)     Height 10/04/16 1856  (1.778 m)     Head Circumference --      Peak Flow --      Pain Score 10/04/16 1855 6  Pain Loc --      Pain Edu? --      Excl. in GC? --     Constitutional: Alert and oriented. Well appearing and in no apparent distress. HEENT:      Head: Normocephalic and atraumatic.         Eyes: Conjunctivae are normal. Sclera is non-icteric. EOMI. PERRL      Mouth/Throat: Mucous membranes are moist.       Neck: Supple with no signs of meningismus. Cardiovascular: Regular rate and rhythm. No murmurs, gallops, or rubs. 2+ symmetrical distal pulses are present in all extremities. No JVD. Respiratory: Normal respiratory effort. Lungs are clear to auscultation  bilaterally. No wheezes, crackles, or rhonchi.  Gastrointestinal: Soft, non tender, and non distended with positive bowel sounds. No rebound or guarding. Genitourinary: No CVA tenderness. Musculoskeletal: Nontender with normal range of motion in all extremities. No edema, cyanosis, or erythema of extremities. Neurologic: Normal speech and language. Face is symmetric. Moving all extremities. No gross focal neurologic deficits are appreciated. Skin: Skin is warm, dry and intact. No rash noted. Psychiatric: Mood and affect are normal. Speech and behavior are normal.  ____________________________________________   LABS (all labs ordered are listed, but only abnormal results are displayed)  Labs Reviewed  HCG, QUANTITATIVE, PREGNANCY - Abnormal; Notable for the following:       Result Value   hCG, Beta Chain, Quant, S 75,821 (*)    All other components within normal limits  CBC - Abnormal; Notable for the following:    WBC 13.6 (*)    All other components within normal limits  ABO/RH   ____________________________________________  EKG  none  ____________________________________________  RADIOLOGY  none  ____________________________________________   PROCEDURES  Procedure(s) performed: None Procedures Critical Care performed:  None ____________________________________________   INITIAL IMPRESSION / ASSESSMENT AND PLAN / ED COURSE  31 y.o. female G5P2 currently at [redacted] weeks GA per [place] on 10/02/16 who presents for evaluation of continuing vaginal bleeding. Patient is hemodynamically stable, stable hemoglobin, hCG is trending up. Patient is A+. No indication for transfusion at this time or rhogam. No indication for repeat emergent [place] at this time. Patient given phone number of [facility] for f/u in 1 week for repeat US. Discussed signs and symptoms of acute blood loss anemia with patient and recommended that she returns if those develop. Offered IVF since  patient tells me she has been dizzy in the last few hours but patient declined. She will f/u with Obgyn. Recommended pelvic rest.      Pertinent labs & imaging results that were available during my care of the patient were reviewed by me and considered in my medical decision making (see chart for details).    ____________________________________________   FINAL CLINICAL IMPRESSION(S) / ED DIAGNOSES  Final diagnoses:  Vaginal bleeding in pregnancy      NEW MEDICATIONS STARTED DURING THIS VISIT:  New Prescriptions   No medications on file     Note:  This document was prepared using Dragon voice recognition software and may include unintentional dictation errors.    Nita Sickle, MD 10/04/16 2039

## 2016-10-05 ENCOUNTER — Telehealth: Payer: Self-pay | Admitting: Obstetrics and Gynecology

## 2016-10-05 NOTE — Telephone Encounter (Signed)
Pt is schedule 10/08/16 for Viability scan and Follow up with [facility] 10/09/16

## 2016-10-05 NOTE — Telephone Encounter (Signed)
[place] please schedule accordingly

## 2016-10-05 NOTE — Telephone Encounter (Signed)
AMS please advise, and route to [facility].

## 2016-10-05 NOTE — Telephone Encounter (Signed)
Needs follow sometime later this week with repeat ultrasound for viability per ER note 3/30

## 2016-10-05 NOTE — Telephone Encounter (Signed)
Pt is calling to be schedule due to ER visit on 10/02/16. Please advise Scheduling ER follow up

## 2016-10-07 ENCOUNTER — Other Ambulatory Visit: Payer: Self-pay | Admitting: Certified Nurse Midwife

## 2016-10-07 DIAGNOSIS — Z3491 Encounter for supervision of normal pregnancy, unspecified, first trimester: Secondary | ICD-10-CM

## 2016-10-08 ENCOUNTER — Other Ambulatory Visit: Payer: Self-pay

## 2016-10-09 ENCOUNTER — Encounter: Payer: Self-pay | Admitting: Certified Nurse Midwife

## 2016-10-19 ENCOUNTER — Ambulatory Visit (INDEPENDENT_AMBULATORY_CARE_PROVIDER_SITE_OTHER): Payer: Managed Care, Other (non HMO) | Admitting: Advanced Practice Midwife

## 2016-10-19 ENCOUNTER — Ambulatory Visit (INDEPENDENT_AMBULATORY_CARE_PROVIDER_SITE_OTHER): Payer: Managed Care, Other (non HMO)

## 2016-10-19 VITALS — BP 138/90 | Ht 70.0 in | Wt 238.0 lb

## 2016-10-19 DIAGNOSIS — O219 Vomiting of pregnancy, unspecified: Secondary | ICD-10-CM

## 2016-10-19 DIAGNOSIS — I10 Essential (primary) hypertension: Secondary | ICD-10-CM | POA: Diagnosis not present

## 2016-10-19 DIAGNOSIS — Z3491 Encounter for supervision of normal pregnancy, unspecified, first trimester: Secondary | ICD-10-CM | POA: Diagnosis not present

## 2016-10-19 MED ORDER — PROMETHAZINE HCL 25 MG RE SUPP: 25.0000 mg | Freq: Three times a day (TID) | RECTAL | 1 refills | Status: DC | PRN

## 2016-10-20 MED ORDER — LABETALOL HCL 100 MG PO TABS: 100.0000 mg | ORAL_TABLET | Freq: Two times a day (BID) | ORAL | 6 refills | Status: DC

## 2016-10-20 NOTE — Progress Notes (Signed)
S: 31 year old G72P2 with IUP dating at [redacted]w[redacted]d by [redacted]w[redacted]d U/S on 3/30 at [facility] with EDD of 05/13/2017. Pt was unsure of LMP. The patient is here today for f/u from ER visit on 10/05/2016.  She has a history of:  1. Chronic back pain (opioid abuse) managed by NP Donald Pore at [facility]- currently on Subutex 2. Hypertension managed by [facility]- currently on HZTZ, Lisinopril 3. Borderline personality disorder, Bipolar disorder, Sociopathic personality disorder 4. Dependence on nicotine and history of Marijuana use The patient states her mood d/o provider is weaning her off zoloft and has discontinued valium and wants to put her on Trintellix for mood stabilization but is unsure if that is safe during pregnancy. She has an appointment scheduled with [facility] in May to begin prenatal care there. Today she has complaint of nausea and vomiting and is requesting a refill of phenergan suppository.   O: BP 138/90   Ht  (1.778 m)   Wt 238 lb (108 kg)   LMP Unknown  BMI 34.15 kg/m  Tired appearing 31 year old female in no acute distress. Cardiac: regular rate and rhythm, Respiratory: no increased effort of breathing, Abdomen: non tender, Genitourinary: deferred  A: IUP at [redacted]w[redacted]d with nausea and vomiting, mood disorders with need for medication management, hypertension on medications, opioid abuse on subutex  P: Refill phenergan Rx for nausea/vomiting Follow up with MD for medication management Follow up with [facility] for prenatal care Discontinue Lisinopril (pregnancy category D) and change to Labetalol for hypertension   Tresea Mall, CNM

## 2016-11-13 ENCOUNTER — Emergency Department: Payer: Managed Care, Other (non HMO)

## 2016-11-13 ENCOUNTER — Encounter: Payer: Self-pay | Admitting: Emergency Medicine

## 2016-11-13 ENCOUNTER — Emergency Department
Admission: EM | Admit: 2016-11-13 | Discharge: 2016-11-13 | Disposition: A | Discharge status: Home / Self Care | Payer: Managed Care, Other (non HMO) | Attending: Emergency Medicine | Admitting: Emergency Medicine

## 2016-11-13 DIAGNOSIS — O219 Vomiting of pregnancy, unspecified: Secondary | ICD-10-CM | POA: Insufficient documentation

## 2016-11-13 DIAGNOSIS — F17210 Nicotine dependence, cigarettes, uncomplicated: Secondary | ICD-10-CM | POA: Diagnosis not present

## 2016-11-13 DIAGNOSIS — O0281 Inappropriate change in quantitative human chorionic gonadotropin (hCG) in early pregnancy: Secondary | ICD-10-CM | POA: Insufficient documentation

## 2016-11-13 DIAGNOSIS — R112 Nausea with vomiting, unspecified: Secondary | ICD-10-CM

## 2016-11-13 DIAGNOSIS — Z79899 Other long term (current) drug therapy: Secondary | ICD-10-CM | POA: Diagnosis not present

## 2016-11-13 DIAGNOSIS — O99332 Smoking (tobacco) complicating pregnancy, second trimester: Secondary | ICD-10-CM | POA: Insufficient documentation

## 2016-11-13 DIAGNOSIS — O479 False labor, unspecified: Secondary | ICD-10-CM

## 2016-11-13 DIAGNOSIS — I10 Essential (primary) hypertension: Secondary | ICD-10-CM | POA: Diagnosis not present

## 2016-11-13 DIAGNOSIS — Z3A14 14 weeks gestation of pregnancy: Secondary | ICD-10-CM | POA: Insufficient documentation

## 2016-11-13 DIAGNOSIS — O4702 False labor before 37 completed weeks of gestation, second trimester: Secondary | ICD-10-CM | POA: Diagnosis not present

## 2016-11-13 LAB — HEPATIC FUNCTION PANEL
ALBUMIN: 3.6 g/dL (ref 3.5–5.0)
ALK PHOS: 57 U/L (ref 38–126)
ALT: 13 U/L — ABNORMAL LOW (ref 14–54)
AST: 19 U/L (ref 15–41)
BILIRUBIN TOTAL: 0.5 mg/dL (ref 0.3–1.2)
Bilirubin, Direct: <0.1 mg/dL — ABNORMAL LOW (ref 0.1–0.5)
Total Protein: 7.4 g/dL (ref 6.5–8.1)

## 2016-11-13 LAB — CBC
HCT: 37.8 % (ref 35.0–47.0)
HEMOGLOBIN: 13.5 g/dL (ref 12.0–16.0)
MCH: 30.2 pg (ref 26.0–34.0)
MCHC: 35.7 g/dL (ref 32.0–36.0)
MCV: 84.7 fL (ref 80.0–100.0)
PLATELETS: 209 10*3/uL (ref 150–440)
RBC: 4.46 MIL/uL (ref 3.80–5.20)
RDW: 13 % (ref 11.5–14.5)
WBC: 13.9 10*3/uL — AB (ref 3.6–11.0)

## 2016-11-13 LAB — BASIC METABOLIC PANEL
ANION GAP: 9 (ref 5–15)
BUN: 6 mg/dL (ref 6–20)
CHLORIDE: 104 mmol/L (ref 101–111)
CO2: 20 mmol/L — AB (ref 22–32)
CREATININE: 0.53 mg/dL (ref 0.44–1.00)
Calcium: 9.3 mg/dL (ref 8.9–10.3)
GFR calc non Af Amer: >60 mL/min (ref >60)
Glucose, Bld: 103 mg/dL — ABNORMAL HIGH (ref 65–99)
POTASSIUM: 3.7 mmol/L (ref 3.5–5.1)
SODIUM: 133 mmol/L — AB (ref 135–145)

## 2016-11-13 LAB — TROPONIN I

## 2016-11-13 LAB — HCG, QUANTITATIVE, PREGNANCY: hCG, Beta Chain, Quant, S: 81198 m[IU]/mL — ABNORMAL HIGH (ref <5)

## 2016-11-13 MED ORDER — SODIUM CHLORIDE 0.9 % IV BOLUS (SEPSIS)
1000.0000 mL | Freq: Once | INTRAVENOUS | Status: AC
  Administered 2016-11-13: 1000 mL via INTRAVENOUS

## 2016-11-13 MED ORDER — HYDRALAZINE HCL 20 MG/ML IJ SOLN
5.0000 mg | Freq: Once | INTRAMUSCULAR | Status: AC
Start: 2016-11-13 — End: 2016-11-13
  Administered 2016-11-13: 5 mg via INTRAVENOUS
  Filled 2016-11-13: qty 1

## 2016-11-13 MED ORDER — ACETAMINOPHEN 500 MG PO TABS
1000.0000 mg | ORAL_TABLET | Freq: Once | ORAL | Status: AC
  Administered 2016-11-13: 1000 mg via ORAL
  Filled 2016-11-13: qty 2

## 2016-11-13 MED ORDER — METOCLOPRAMIDE HCL 10 MG PO TABS: 10.0000 mg | ORAL_TABLET | Freq: Three times a day (TID) | ORAL | 0 refills | Status: DC | PRN

## 2016-11-13 NOTE — ED Provider Notes (Signed)
[facility] Emergency Department Provider Note  ____________________________________________  Time seen: Approximately 12:37 PM  I have reviewed the triage vital signs and the nursing notes.   HISTORY  Chief Complaint Chest Pain   HPI Breanna Mathews is a 31 y.o. female G3P2 currently at [redacted] weeks gestational age, drug seeking behavior, chronic pain followed by pain clinic, smokingwho presents for evaluation of abdominal pain and chest pain. Patient reports since yesterday she has been having Braxton Mathews contractions. She is also complaining of a chest pain that she describes as a tickle sensation in the center of her chest constant and non radiating since yesterday. She is concerned about preeclampsia as she had similar symptoms with her prior 2 pregnancies which were both complicated by preeclampsia. She denies personal or family history of ischemic heart disease, blood clots, no recent travel or immobilization, no leg pain or swelling, no pleuritic chest pain, no shortness of breath. She reports that she has been having intermittent frontal throbbing headaches and visual floaters. She is also complaining of nausea and vomiting since yesterday. No HA at this time. She is followed at [facility] for her pregnancy. Patient has no diarrhea, no fever, no chills, no dysuria or hematuria, no vaginal bleeding.   Past Medical History:  Diagnosis Date  . Angina of effort (HCC)   . Anxiety   . Bipolar disorder with depression (HCC)   . Chronic back pain   . Hypertension   . Mitral valve prolapse     Patient Active Problem List   Diagnosis Date Noted  . Drug-seeking behavior 07/30/2016  . Opioid use agreement exists 07/30/2016  . Acute low back pain 06/19/2016  . Chronic jaw pain 01/16/2016  . Oropharyngeal lesion 01/16/2016  . Mid sternal chest pain 10/04/2015  . Bipolar disorder with depression (HCC) 09/03/2015  . 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 Spell  of altered consciousness 05/15/2015  . Dependence on nicotine from cigarettes 04/10/2015  . History of hypertension 04/10/2015  . Intentional fentanyl overdose (HCC) 04/10/2015  . Sinus tachycardia 04/10/2015  . Suicidal ideation 04/10/2015  . Chronic pain disorder 04/09/2015  . Dissociative episodes 04/09/2015  . Mood disorder (HCC) 04/09/2015  . Opioid use disorder, severe, dependence (HCC) 04/09/2015  . Chronic bilateral low back pain with bilateral sciatica 03/08/2015  . Borderline personality disorder 01/16/2015  . 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 Uncomplicated opioid dependence (HCC) 06/22/2014  . Essential (primary) hypertension 03/17/2013  . Migraine with aura and without status migrainosus, not intractable 03/17/2013  . Other specified anxiety disorders 03/17/2013  . Spondylosis of cervical region without myelopathy or radiculopathy 07/11/2012  . Spondylosis of thoracic region without myelopathy or radiculopathy 07/11/2012  . Cannabis abuse, uncomplicated 11/27/2011    Past Surgical History:  Procedure Laterality Date  . BACK SURGERY      Prior to Admission medications   Medication Sig Start Date End Date Taking? Authorizing Provider  labetalol (NORMODYNE) 100 MG tablet Take 1 tablet (100 mg total) by mouth 2 (two) times daily. 10/20/16  Yes Tresea Mall, CNM  vortioxetine HBr (TRINTELLIX) 10 MG TABS Take 10 mg by mouth daily. 10/19/16  Yes Provider, Historical, Breanna Mathews  lidocaine (LIDODERM) 5 % Place 1 patch onto the skin every 12 (twelve) hours. Remove & Discard patch within 12 hours or as directed by Breanna Mathews Patient not taking: Reported on 10/19/2016 07/21/16 07/21/17  Enid Derry, PA-C  metoCLOPramide (REGLAN) 10 MG tablet Take 1 tablet (  10 mg total) by mouth every 8 (eight) hours as needed for nausea. 11/13/16 11/16/16  Nita Sickle, Breanna Mathews  promethazine (PHENERGAN) 25 MG suppository Place 1 suppository (25 mg total) rectally every 8  (eight) hours as needed for nausea or vomiting. 10/19/16 10/29/16  Tresea Mall, CNM    Allergies Oxymorphone; Keflex [cephalexin]; Lithium; and Opana [oxymorphone hcl]  History reviewed. No pertinent family history.  Social History Social History  Substance Use Topics  . Smoking status: Current Every Day Smoker  . Smokeless tobacco: Never Used  . Alcohol use No    Review of Systems  Constitutional: Negative for fever. Eyes: Negative for visual changes. ENT: Negative for sore throat. Neck: No neck pain  Cardiovascular: +chest pain. Respiratory: Negative for shortness of breath. Gastrointestinal: Negative for abdominal pain, vomiting or diarrhea. + abdominal cramps Genitourinary: Negative for dysuria. Musculoskeletal: Negative for back pain. Skin: Negative for rash. Neurological: Negative for  weakness or numbness. + HA and visual floaters Psych: No SI or HI  ____________________________________________   PHYSICAL EXAM:  VITAL SIGNS: ED Triage Vitals  Enc Vitals Group     BP 11/13/16 1122 (!) 144/95     Pulse Rate 11/13/16 1120 85     Resp 11/13/16 1120 18     Temp 11/13/16 1120 98.9 F (37.2 C)     Temp Source 11/13/16 1120 Oral     SpO2 11/13/16 1120 97 %     Weight 11/13/16 1120 230 lb (104.3 kg)     Height 11/13/16 1120 5\' 9"  (1.753 m)     Head Circumference --      Peak Flow --      Pain Score 11/13/16 1120 6     Pain Loc --      Pain Edu? --      Excl. in GC? --     Constitutional: Alert and oriented. Well appearing and in no apparent distress. HEENT:      Head: Normocephalic and atraumatic.         Eyes: Conjunctivae are normal. Sclera is non-icteric. EOMI. PERRL      Mouth/Throat: Mucous membranes are moist.       Neck: Supple with no signs of meningismus. Cardiovascular: Regular rate and rhythm. No murmurs, gallops, or rubs. 2+ symmetrical distal pulses are present in all extremities. No JVD. Respiratory: Normal respiratory effort. Lungs are  clear to auscultation bilaterally. No wheezes, crackles, or rhonchi.  Gastrointestinal: Soft, non tender, and non distended with positive bowel sounds. No rebound or guarding. Genitourinary: No CVA tenderness. Musculoskeletal: Nontender with normal range of motion in all extremities. No edema, cyanosis, or erythema of extremities. Neurologic: Normal speech and language. A & O x3, PERRL, no nystagmus, CN II-XII intact, motor testing reveals good tone and bulk throughout. There is no evidence of pronator drift or dysmetria. Muscle strength is 5/5 throughout. Deep tendon reflexes are 2+ throughout with downgoing toes. Sensory examination is intact. Gait is normal. Skin: Skin is warm, dry and intact. No rash noted. Psychiatric: Mood and affect are normal. Speech and behavior are normal.  ____________________________________________   LABS (all labs ordered are listed, but only abnormal results are displayed)  Labs Reviewed  BASIC METABOLIC PANEL - Abnormal; Notable for the following:       Result Value   Sodium 133 (*)    CO2 20 (*)    Glucose, Bld 103 (*)    All other components within normal limits  CBC - Abnormal; Notable for the following:  WBC 13.9 (*)    All other components within normal limits  HCG, QUANTITATIVE, PREGNANCY - Abnormal; Notable for the following:    hCG, Beta Chain, Quant, S 81,198 (*)    All other components within normal limits  HEPATIC FUNCTION PANEL - Abnormal; Notable for the following:    ALT 13 (*)    Bilirubin, Direct <0.1 (*)    All other components within normal limits  TROPONIN I  URINALYSIS, COMPLETE (UACMP) WITH MICROSCOPIC  PROTEIN / CREATININE RATIO, URINE   ____________________________________________  EKG  ED ECG REPORT I, Nita Sicklearolina Betha Shadix, the attending physician, personally viewed and interpreted this ECG.  Normal sinus rhythm, rate of 86, normal intervals, normal axis, no ST elevations or depressions. Normal  EKG. ____________________________________________  RADIOLOGY  CXR: No acute abnormality.  ____________________________________________   PROCEDURES  Procedure(s) performed: None Procedures Critical Care performed:  None ____________________________________________   INITIAL IMPRESSION / ASSESSMENT AND PLAN / ED COURSE  31 y.o. female G3P2 currently at 1514 weeks gestational age, drug seeking behavior, chronic pain followed by pain clinic, smokingwho presents for evaluation of tickle sensation in her chest, intermittent headaches and visual floaters, and Breanna Mathews since yesterday. Patient is concerned because these symptoms are similar to her prior pregnancies where she was diagnosed with preeclampsia. She is on labetalol and has taken it today. We'll check LFTs, platelets, UPC. Will give a dose of hydralazine. EKG with no ischemic changes.   Clinical Course as of Nov 13 1424  Fri Nov 13, 2016  1419 FHR 143, labs with no acute findings. Normal platelets and LFTs. Patient has not given urine yet. She received IV hydralazine with improvement of BP. Received tylenol for pain. Received IVF. Patient [facility]otic [facility]. I explained to her that having Breanna Mathews is not  an indication for narcotic pain medication. Patient tells me that if I Breanna't give her IV pain medications that she wants to leave the hospital at this time. I explained to her that as of now her blood work has been fine however we have not check her urine for protein. Patient does not wish to stay to provide urine. I recommended that she follow up closely with her Obgyn on Monday.   [CV]    Clinical Course User Index [CV] Nita SickleVeronese, [place], Breanna Mathews    Pertinent labs & imaging results that were available during my care of the patient were reviewed by me and considered in my medical decision making (see chart for details).    ____________________________________________   FINAL  CLINICAL IMPRESSION(S) / ED DIAGNOSES  Final diagnoses:  Braxton Hick's contraction  Non-intractable vomiting with nausea, unspecified vomiting type      NEW MEDICATIONS STARTED DURING THIS VISIT:  New Prescriptions   METOCLOPRAMIDE (REGLAN) 10 MG TABLET    Take 1 tablet (10 mg total) by mouth every 8 (eight) hours as needed for nausea.     Note:  This document was prepared using Dragon voice recognition software and may include unintentional dictation errors.    Breanna Mathews, [place], Breanna Mathews 11/13/16 [phone]

## 2016-11-13 NOTE — ED Notes (Addendum)
Pt was told by RN to wait for discharge papers. IV removed per pt request. MD working on pts papers and pt left room and left ED. Pt not present when prescriptions and papers taken into room. Pt ambulatory and in NAD. Vitals not retaken due to pt leaving prior to discharge.

## 2016-11-13 NOTE — ED Triage Notes (Signed)
Pt c/o central chest pain that radiates into neck. Pt is [redacted] weeks pregnant. History of pre-eclampsia with previous pregnancies. [id number](0G5P2A2(2 miscarriage).  Only other complaint is she has had vomiting since yesterday. Respirations unlabored. Skin warm and dry. NAD.

## 2016-11-13 NOTE — ED Notes (Signed)
Pt ambulatory upon discharge and verbalized understanding of follow up with pain clinic and PCP. Pt informed of results. Pt in NAD at time of d/c.

## 2016-11-17 ENCOUNTER — Emergency Department
Admission: EM | Admit: 2016-11-17 | Discharge: 2016-11-17 | Disposition: A | Discharge status: Home / Self Care | Payer: Managed Care, Other (non HMO) | Attending: Emergency Medicine | Admitting: Emergency Medicine

## 2016-11-17 ENCOUNTER — Encounter: Payer: Self-pay | Admitting: Emergency Medicine

## 2016-11-17 DIAGNOSIS — Z7289 Other problems related to lifestyle: Secondary | ICD-10-CM | POA: Insufficient documentation

## 2016-11-17 DIAGNOSIS — Y929 Unspecified place or not applicable: Secondary | ICD-10-CM | POA: Insufficient documentation

## 2016-11-17 DIAGNOSIS — O99332 Smoking (tobacco) complicating pregnancy, second trimester: Secondary | ICD-10-CM | POA: Insufficient documentation

## 2016-11-17 DIAGNOSIS — Z3A15 15 weeks gestation of pregnancy: Secondary | ICD-10-CM | POA: Diagnosis not present

## 2016-11-17 DIAGNOSIS — F17200 Nicotine dependence, unspecified, uncomplicated: Secondary | ICD-10-CM | POA: Diagnosis not present

## 2016-11-17 DIAGNOSIS — S52514A Nondisplaced fracture of right radial styloid process, initial encounter for closed fracture: Secondary | ICD-10-CM | POA: Diagnosis not present

## 2016-11-17 DIAGNOSIS — O210 Mild hyperemesis gravidarum: Secondary | ICD-10-CM

## 2016-11-17 DIAGNOSIS — O211 Hyperemesis gravidarum with metabolic disturbance: Secondary | ICD-10-CM | POA: Insufficient documentation

## 2016-11-17 DIAGNOSIS — Y939 Activity, unspecified: Secondary | ICD-10-CM | POA: Insufficient documentation

## 2016-11-17 DIAGNOSIS — Z79899 Other long term (current) drug therapy: Secondary | ICD-10-CM | POA: Diagnosis not present

## 2016-11-17 DIAGNOSIS — O162 Unspecified maternal hypertension, second trimester: Secondary | ICD-10-CM | POA: Insufficient documentation

## 2016-11-17 DIAGNOSIS — O9A212 Injury, poisoning and certain other consequences of external causes complicating pregnancy, second trimester: Secondary | ICD-10-CM | POA: Insufficient documentation

## 2016-11-17 DIAGNOSIS — O219 Vomiting of pregnancy, unspecified: Secondary | ICD-10-CM | POA: Diagnosis present

## 2016-11-17 DIAGNOSIS — X58XXXA Exposure to other specified factors, initial encounter: Secondary | ICD-10-CM | POA: Diagnosis not present

## 2016-11-17 DIAGNOSIS — Y999 Unspecified external cause status: Secondary | ICD-10-CM | POA: Insufficient documentation

## 2016-11-17 LAB — URINALYSIS, COMPLETE (UACMP) WITH MICROSCOPIC
Bilirubin Urine: NEGATIVE
GLUCOSE, UA: NEGATIVE mg/dL
HGB URINE DIPSTICK: NEGATIVE
KETONES UR: 5 mg/dL — AB
Nitrite: NEGATIVE
PH: 6 (ref 5.0–8.0)
PROTEIN: 100 mg/dL — AB
Specific Gravity, Urine: 1.029 (ref 1.005–1.030)

## 2016-11-17 LAB — LIPASE, BLOOD: Lipase: 19 U/L (ref 11–51)

## 2016-11-17 LAB — COMPREHENSIVE METABOLIC PANEL
ALBUMIN: 3.7 g/dL (ref 3.5–5.0)
ALK PHOS: 65 U/L (ref 38–126)
ALT: 10 U/L — ABNORMAL LOW (ref 14–54)
AST: 15 U/L (ref 15–41)
Anion gap: 9 (ref 5–15)
BILIRUBIN TOTAL: 0.4 mg/dL (ref 0.3–1.2)
BUN: 6 mg/dL (ref 6–20)
CHLORIDE: 106 mmol/L (ref 101–111)
CO2: 18 mmol/L — ABNORMAL LOW (ref 22–32)
Calcium: 9.4 mg/dL (ref 8.9–10.3)
Creatinine, Ser: 0.68 mg/dL (ref 0.44–1.00)
GFR calc Af Amer: >60 mL/min (ref >60)
GFR calc non Af Amer: >60 mL/min (ref >60)
Glucose, Bld: 104 mg/dL — ABNORMAL HIGH (ref 65–99)
Potassium: 3.5 mmol/L (ref 3.5–5.1)
SODIUM: 133 mmol/L — AB (ref 135–145)
Total Protein: 7.5 g/dL (ref 6.5–8.1)

## 2016-11-17 LAB — CBC
HEMATOCRIT: 37.1 % (ref 35.0–47.0)
HEMOGLOBIN: 12.9 g/dL (ref 12.0–16.0)
MCH: 29.4 pg (ref 26.0–34.0)
MCHC: 34.6 g/dL (ref 32.0–36.0)
MCV: 84.8 fL (ref 80.0–100.0)
Platelets: 260 10*3/uL (ref 150–440)
RBC: 4.38 MIL/uL (ref 3.80–5.20)
RDW: 13.2 % (ref 11.5–14.5)
WBC: 16.9 10*3/uL — ABNORMAL HIGH (ref 3.6–11.0)

## 2016-11-17 MED ORDER — SODIUM CHLORIDE 0.9 % IV BOLUS (SEPSIS)
1000.0000 mL | Freq: Once | INTRAVENOUS | Status: AC
  Administered 2016-11-17: 1000 mL via INTRAVENOUS

## 2016-11-17 MED ORDER — ONDANSETRON 8 MG PO TBDP: 8.0000 mg | ORAL_TABLET | Freq: Three times a day (TID) | ORAL | 1 refills | Status: DC | PRN

## 2016-11-17 MED ORDER — PROMETHAZINE HCL 25 MG RE SUPP: 25.0000 mg | Freq: Four times a day (QID) | RECTAL | 1 refills | Status: DC | PRN

## 2016-11-17 MED ORDER — VITAMIN B-6 50 MG PO TABS: 50.0000 mg | ORAL_TABLET | Freq: Three times a day (TID) | ORAL | 1 refills | Status: DC

## 2016-11-17 MED ORDER — PROMETHAZINE HCL 25 MG/ML IJ SOLN
12.5000 mg | Freq: Once | INTRAMUSCULAR | Status: AC
  Administered 2016-11-17: 12.5 mg via INTRAVENOUS
  Filled 2016-11-17: qty 1

## 2016-11-17 MED ORDER — METOCLOPRAMIDE HCL 10 MG PO TABS: 10.0000 mg | ORAL_TABLET | Freq: Three times a day (TID) | ORAL | 0 refills | Status: DC

## 2016-11-17 MED ORDER — PROMETHAZINE HCL 25 MG/ML IJ SOLN
12.5000 mg | Freq: Once | INTRAMUSCULAR | Status: AC
  Administered 2016-11-17: 12.5 mg via INTRAVENOUS

## 2016-11-17 NOTE — ED Provider Notes (Signed)
[facility] Emergency Department Provider Note    First MD Initiated Contact with Patient 11/17/16 1839     (approximate)  I have reviewed the triage vital signs and the nursing notes.   HISTORY  Chief Complaint Emesis and Hypertension   HPI Breanna Mathews is a 31 y.o. female G3P2 with below of chronic medical conditions approximately [redacted] weeks pregnant presents to the emergency department with inability to tolerate eating or drinking times "weeks". Patient states that she had an appointment today with high risk OB at [facility] however was unable to make that appointment as she had a pain management appointment today secondary to a right broken wrists. Patient admits to approximately 15 pound weight loss in the past 2 weeks. Patient denies any abdominal pain no vaginal bleeding   Past Medical History:  Diagnosis Date  . Angina of effort (HCC)   . Anxiety   . Bipolar disorder with depression (HCC)   . Chronic back pain   . Hypertension   . Mitral valve prolapse     Patient Active Problem List   Diagnosis Date Noted  . Drug-seeking behavior 07/30/2016  . Opioid use agreement exists 07/30/2016  . Acute low back pain 06/19/2016  . Chronic jaw pain 01/16/2016  . Oropharyngeal lesion 01/16/2016  . Mid sternal chest pain 10/04/2015  . Bipolar disorder with depression (HCC) 09/03/2015  . 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 Spell of altered consciousness 05/15/2015  . Dependence on nicotine from cigarettes 04/10/2015  . History of hypertension 04/10/2015  . Intentional fentanyl overdose (HCC) 04/10/2015  . Sinus tachycardia 04/10/2015  . Suicidal ideation 04/10/2015  . Chronic pain disorder 04/09/2015  . Dissociative episodes 04/09/2015  . Mood disorder (HCC) 04/09/2015  . Opioid use disorder, severe, dependence (HCC) 04/09/2015  . Chronic bilateral low back pain with bilateral sciatica 03/08/2015  . Borderline personality disorder 01/16/2015  .  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 Uncomplicated opioid dependence (HCC) 06/22/2014  . Essential (primary) hypertension 03/17/2013  . Migraine with aura and without status migrainosus, not intractable 03/17/2013  . Other specified anxiety disorders 03/17/2013  . Spondylosis of cervical region without myelopathy or radiculopathy 07/11/2012  . Spondylosis of thoracic region without myelopathy or radiculopathy 07/11/2012  . Cannabis abuse, uncomplicated 11/27/2011    Past Surgical History:  Procedure Laterality Date  . BACK SURGERY      Prior to Admission medications   Medication Sig Start Date End Date Taking? Authorizing Provider  buprenorphine (SUBUTEX) 8 MG SUBL SL tablet Place 4 mg under the tongue 3 (three) times daily. 11/02/16  Yes [provider]  fentaNYL (DURAGESIC - DOSED MCG/HR) 12 MCG/HR Place 1 patch onto the skin once. 11/15/16 11/17/16 Yes [provider]  labetalol (NORMODYNE) 100 MG tablet Take 1 tablet (100 mg total) by mouth 2 (two) times daily. 10/20/16  Yes Tresea Mall, CNM  sertraline (ZOLOFT) 50 MG tablet Take 150 mg by mouth daily. 09/07/16  Yes [provider]  lidocaine (LIDODERM) 5 % Place 1 patch onto the skin every 12 (twelve) hours. Remove & Discard patch within 12 hours or as directed by MD Patient not taking: Reported on 10/19/2016 07/21/16 07/21/17  Enid Derry, PA-C  metoCLOPramide (REGLAN) 10 MG tablet Take 1 tablet (10 mg total) by mouth every 8 (eight) hours as needed for nausea. 11/13/16 11/16/16  Nita Sickle, MD  promethazine (PHENERGAN) 25 MG suppository Place 1 suppository (25 mg total) rectally every  8 (eight) hours as needed for nausea or vomiting. 10/19/16 10/29/16  Tresea MallGledhill, Jane, CNM    Allergies Oxymorphone; Keflex [cephalexin]; Lithium; and Opana [oxymorphone hcl]  No family history on file.  Social History Social History  Substance Use Topics  . Smoking status:  Current Every Day Smoker  . Smokeless tobacco: Never Used  . Alcohol use No    Review of Systems Constitutional: No fever/chills Eyes: No visual changes. ENT: No sore throat. Cardiovascular: Denies chest pain. Respiratory: Denies shortness of breath. Gastrointestinal: No abdominal pain.  Positive for nausea and vomiting. No diarrhea Genitourinary: Negative for dysuria. Musculoskeletal: Negative for back pain. Integumentary: Negative for rash. Neurological: Negative for headaches, focal weakness or numbness.   ____________________________________________   PHYSICAL EXAM:  VITAL SIGNS: ED Triage Vitals  Enc Vitals Group     BP 11/17/16 1613 (!) 150/91     Pulse Rate 11/17/16 1613 73     Resp 11/17/16 1613 16     Temp 11/17/16 1613 98.2 F (36.8 C)     Temp Source 11/17/16 1613 Oral     SpO2 11/17/16 1613 96 %     Weight 11/17/16 1612 222 lb 0.1 oz (100.7 kg)     Height 11/17/16 1612 5\' 9"  (1.753 m)     Head Circumference --      [facility] Flow --      Pain Score 11/17/16 1612 0     Pain Loc --      Pain Edu? --      Excl. in GC? --     Constitutional: Alert and oriented. Well appearing and in no acute distress. Eyes: Conjunctivae are normal. PERRL. EOMI. Head: Atraumatic. Mouth/Throat: Mucous membranes are moist.  Oropharynx non-erythematous. Neck: No stridor.   Cardiovascular: Normal rate, regular rhythm. Good peripheral circulation. Grossly normal heart sounds. Respiratory: Normal respiratory effort.  No retractions. Lungs CTAB. Gastrointestinal: Soft and nontender. No distention.   Musculoskeletal: No lower extremity tenderness nor edema. No gross deformities of extremities. Neurologic:  Normal speech and language. No gross focal neurologic deficits are appreciated.  Skin:  Skin is warm, dry and intact. No rash noted. Psychiatric: Mood and affect are normal. Speech and behavior are normal.  ____________________________________________   LABS (all labs ordered are  listed, but only abnormal results are displayed)  Labs Reviewed  COMPREHENSIVE METABOLIC PANEL - Abnormal; Notable for the following:       Result Value   Sodium 133 (*)    CO2 18 (*)    Glucose, Bld 104 (*)    ALT 10 (*)    All other components within normal limits  CBC - Abnormal; Notable for the following:    WBC 16.9 (*)    All other components within normal limits  URINALYSIS, COMPLETE (UACMP) WITH MICROSCOPIC - Abnormal; Notable for the following:    Color, Urine Kyleigh (*)    APPearance CLOUDY (*)    Ketones, ur 5 (*)    Protein, ur 100 (*)    Leukocytes, UA TRACE (*)    Bacteria, UA RARE (*)    Squamous Epithelial / LPF 0-5 (*)    All other components within normal limits  LIPASE, BLOOD     Procedures   ____________________________________________   INITIAL IMPRESSION / ASSESSMENT AND PLAN / ED COURSE  Pertinent labs & imaging results that were available during my care of the patient were reviewed by me and considered in my medical decision making (see chart for details).  Patient given 2  L IV normal saline 12.5 mg of IV Phenergan. Patient states nausea persisted after Phenergan dose and a such an additional 12.5 mg of IV Phenergan was administered. Patient did indeed lose 4 kg since last visit on 11/03/2016. As such patient discussed with Dr. Ranae Plumber OB/GYN on-call who will evaluate patient in the emergency department.      ____________________________________________  FINAL CLINICAL IMPRESSION(S) / ED DIAGNOSES  Final diagnoses:  Hyperemesis gravidarum     MEDICATIONS GIVEN DURING THIS VISIT:  Medications  sodium chloride 0.9 % bolus 1,000 mL (1,000 mLs Intravenous New Bag/Given 11/17/16 1925)  sodium chloride 0.9 % bolus 1,000 mL (1,000 mLs Intravenous New Bag/Given 11/17/16 1924)  promethazine (PHENERGAN) injection 12.5 mg (12.5 mg Intravenous Given 11/17/16 1924)  promethazine (PHENERGAN) injection 12.5 mg (12.5 mg Intravenous Given 11/17/16 2011)      NEW OUTPATIENT MEDICATIONS STARTED DURING THIS VISIT:  New Prescriptions   No medications on file    Modified Medications   No medications on file    Discontinued Medications   VORTIOXETINE HBR (TRINTELLIX) 10 MG TABS    Take 10 mg by mouth daily.     Note:  This document was prepared using Dragon voice recognition software and may include unintentional dictation errors.    Darci Current, MD 11/17/16 2129

## 2016-11-17 NOTE — ED Notes (Signed)
Thumb spica applied and pt began complaining of pain on bruised area of arm. Pt requesting ulnar gutter to protect arm from pain when resting it on bed. Splint applied.

## 2016-11-17 NOTE — ED Triage Notes (Signed)
Sent to ED from Pain Management doctor.  Patient's BP elevated and has a 15 pound weight loss in 2 weeks. Seen through ED for Friday for dehydration.  Has appointment at high risk OB clinic on 5/25.  Had an appointment scheduled for today, but cancelled appointment to go to pain management.  Patient states with first two pregnancy's she was hospitalized due to pre ecclampsia.  States has not been able to keep anything on stomach for the past 3 days.

## 2016-11-17 NOTE — Consult Note (Addendum)
31yo [id number] @ 14.5 BP (!) 144/91 (BP Location: Left Arm)   Pulse 70   Temp 98.2 F (36.8 C) (Oral)   Resp 16   Ht '5\' 9"'  (1.753 m)   Wt 100.7 kg (222 lb 0.1 oz)   LMP  (LMP Unknown)   SpO2 97%   BMI 32.78 kg/m    I was asked to come see the patient due to hyperemesis and 10# weight loss; that she was part of the [facility].  I met with a patient and her significant other -  She had her right wrist bandaged/cast due to recent break She was sipping on cranberry juice, and at the end of our talk was eating peanut butter and graham crackers.  She had been given phenergan IV.  She states in all of her previous pregnancies she was hospitalized for hyperemesis and preeclampsia.  She does not have allergies to any anti-emetics.  She is actually a patient of [facility] upon chart review. I spoke with Dr. Georgianne Fick, who is on call for their group. He agrees with my assessment of hyperemesis, and plan, which is:  1. Stable and desires discharge home  2. Prescriptions for zofran, reglan, phenergan and vitamin b6, in some combination to at least keep most meals down 3. Recommendation to eat small frequent meals, if only bites at a time 4. Electrolyte fluids for now, like Gatorade or Vitamin water, again in sips. 5. Follow up with [facility] this week. 6. Can take ibuprofen in small, infrequent doses at this gestation for abdominal MSK pain from vomiting.  Discuss with OB any other meds, and when appropriate to take. ----- Larey Days, MD Attending Obstetrician and Gynecologist [facility], Department of [facility]

## 2016-12-20 ENCOUNTER — Encounter: Payer: Self-pay | Admitting: *Deleted

## 2016-12-20 ENCOUNTER — Emergency Department
Admission: EM | Admit: 2016-12-20 | Discharge: 2016-12-20 | Disposition: A | Discharge status: Home / Self Care | Payer: Managed Care, Other (non HMO) | Attending: Student in an Organized Health Care Education/Training Program | Admitting: Student in an Organized Health Care Education/Training Program

## 2016-12-20 ENCOUNTER — Emergency Department: Payer: Managed Care, Other (non HMO)

## 2016-12-20 DIAGNOSIS — R079 Chest pain, unspecified: Secondary | ICD-10-CM

## 2016-12-20 DIAGNOSIS — O10012 Pre-existing essential hypertension complicating pregnancy, second trimester: Secondary | ICD-10-CM | POA: Insufficient documentation

## 2016-12-20 DIAGNOSIS — G8929 Other chronic pain: Secondary | ICD-10-CM | POA: Diagnosis not present

## 2016-12-20 DIAGNOSIS — F17200 Nicotine dependence, unspecified, uncomplicated: Secondary | ICD-10-CM | POA: Diagnosis not present

## 2016-12-20 DIAGNOSIS — Z3A21 21 weeks gestation of pregnancy: Secondary | ICD-10-CM | POA: Diagnosis not present

## 2016-12-20 DIAGNOSIS — R1013 Epigastric pain: Secondary | ICD-10-CM | POA: Diagnosis present

## 2016-12-20 DIAGNOSIS — I10 Essential (primary) hypertension: Secondary | ICD-10-CM

## 2016-12-20 DIAGNOSIS — Z79899 Other long term (current) drug therapy: Secondary | ICD-10-CM | POA: Diagnosis not present

## 2016-12-20 DIAGNOSIS — O9989 Other specified diseases and conditions complicating pregnancy, childbirth and the puerperium: Secondary | ICD-10-CM | POA: Insufficient documentation

## 2016-12-20 LAB — CBC
HCT: 34.3 % — ABNORMAL LOW (ref 35.0–47.0)
HEMOGLOBIN: 11.9 g/dL — AB (ref 12.0–16.0)
MCH: 29.5 pg (ref 26.0–34.0)
MCHC: 34.6 g/dL (ref 32.0–36.0)
MCV: 85.5 fL (ref 80.0–100.0)
Platelets: 260 10*3/uL (ref 150–440)
RBC: 4.02 MIL/uL (ref 3.80–5.20)
RDW: 13.9 % (ref 11.5–14.5)
WBC: 17.6 10*3/uL — ABNORMAL HIGH (ref 3.6–11.0)

## 2016-12-20 LAB — URINALYSIS, COMPLETE (UACMP) WITH MICROSCOPIC
Bilirubin Urine: NEGATIVE
GLUCOSE, UA: NEGATIVE mg/dL
Hgb urine dipstick: NEGATIVE
KETONES UR: NEGATIVE mg/dL
LEUKOCYTES UA: NEGATIVE
Nitrite: NEGATIVE
PH: 7 (ref 5.0–8.0)
Protein, ur: NEGATIVE mg/dL
Specific Gravity, Urine: 1.009 (ref 1.005–1.030)

## 2016-12-20 LAB — BASIC METABOLIC PANEL
ANION GAP: 6 (ref 5–15)
BUN: 6 mg/dL (ref 6–20)
CALCIUM: 9.2 mg/dL (ref 8.9–10.3)
CO2: 20 mmol/L — ABNORMAL LOW (ref 22–32)
Chloride: 108 mmol/L (ref 101–111)
Creatinine, Ser: 0.55 mg/dL (ref 0.44–1.00)
GLUCOSE: 86 mg/dL (ref 65–99)
Potassium: 3.7 mmol/L (ref 3.5–5.1)
SODIUM: 134 mmol/L — AB (ref 135–145)

## 2016-12-20 LAB — BRAIN NATRIURETIC PEPTIDE: B NATRIURETIC PEPTIDE 5: 22 pg/mL (ref 0.0–100.0)

## 2016-12-20 LAB — TROPONIN I

## 2016-12-20 LAB — FIBRIN DERIVATIVES D-DIMER (ARMC ONLY): Fibrin derivatives D-dimer (ARMC): 323.73 (ref 0.00–499.00)

## 2016-12-20 MED ORDER — PROMETHAZINE HCL 25 MG/ML IJ SOLN
INTRAMUSCULAR | Status: AC
  Administered 2016-12-20: 12.5 mg via INTRAVENOUS
  Filled 2016-12-20: qty 1

## 2016-12-20 MED ORDER — BUPRENORPHINE HCL 8 MG SL SUBL
8.0000 mg | SUBLINGUAL_TABLET | Freq: Every day | SUBLINGUAL | Status: DC
Start: 2016-12-21 — End: 2016-12-20

## 2016-12-20 MED ORDER — NITROFURANTOIN MONOHYD MACRO 100 MG PO CAPS: 100.0000 mg | ORAL_CAPSULE | Freq: Two times a day (BID) | ORAL | 0 refills | Status: AC

## 2016-12-20 MED ORDER — BUPRENORPHINE HCL 8 MG SL SUBL
4.0000 mg | SUBLINGUAL_TABLET | Freq: Every day | SUBLINGUAL | Status: DC
  Administered 2016-12-20: 4 mg via SUBLINGUAL
  Filled 2016-12-20: qty 1

## 2016-12-20 MED ORDER — DIPHENHYDRAMINE HCL 50 MG/ML IJ SOLN
25.0000 mg | Freq: Once | INTRAMUSCULAR | Status: AC
  Administered 2016-12-20: 25 mg via INTRAVENOUS
  Filled 2016-12-20: qty 1

## 2016-12-20 MED ORDER — LABETALOL HCL 5 MG/ML IV SOLN
5.0000 mg | Freq: Once | INTRAVENOUS | Status: AC
  Administered 2016-12-20: 5 mg via INTRAVENOUS
  Filled 2016-12-20: qty 4

## 2016-12-20 MED ORDER — LABETALOL HCL 100 MG PO TABS
100.0000 mg | ORAL_TABLET | Freq: Once | ORAL | Status: AC
  Administered 2016-12-20: 100 mg via ORAL
  Filled 2016-12-20: qty 1

## 2016-12-20 MED ORDER — BUPRENORPHINE HCL 8 MG SL SUBL
4.0000 mg | SUBLINGUAL_TABLET | Freq: Once | SUBLINGUAL | Status: AC
  Administered 2016-12-20: 4 mg via SUBLINGUAL

## 2016-12-20 MED ORDER — PROMETHAZINE HCL 25 MG/ML IJ SOLN
12.5000 mg | Freq: Once | INTRAMUSCULAR | Status: AC
  Administered 2016-12-20: 12.5 mg via INTRAVENOUS

## 2016-12-20 MED ORDER — BUPRENORPHINE HCL 8 MG SL SUBL: 8.0000 mg | SUBLINGUAL_TABLET | Freq: Every day | SUBLINGUAL | Status: DC

## 2016-12-20 MED ORDER — SODIUM CHLORIDE 0.9 % IV BOLUS (SEPSIS)
500.0000 mL | Freq: Once | INTRAVENOUS | Status: AC
  Administered 2016-12-20: 500 mL via INTRAVENOUS

## 2016-12-20 MED ORDER — BUPRENORPHINE HCL 8 MG SL SUBL
SUBLINGUAL_TABLET | SUBLINGUAL | Status: AC
  Administered 2016-12-20: 4 mg via SUBLINGUAL
  Filled 2016-12-20: qty 1

## 2016-12-20 MED ORDER — CALCIUM CARBONATE ANTACID 500 MG PO CHEW
1.0000 | CHEWABLE_TABLET | Freq: Once | ORAL | Status: AC
  Administered 2016-12-20: 200 mg via ORAL

## 2016-12-20 MED ORDER — CALCIUM CARBONATE ANTACID 500 MG PO CHEW
1.0000 | CHEWABLE_TABLET | Freq: Once | ORAL | Status: AC
  Administered 2016-12-20: 200 mg via ORAL
  Filled 2016-12-20: qty 1

## 2016-12-20 MED ORDER — PROMETHAZINE HCL 25 MG/ML IJ SOLN
12.5000 mg | Freq: Once | INTRAMUSCULAR | Status: AC
  Administered 2016-12-20: 12.5 mg via INTRAVENOUS
  Filled 2016-12-20: qty 1

## 2016-12-20 MED ORDER — LABETALOL HCL 100 MG PO TABS: 200.0000 mg | ORAL_TABLET | Freq: Two times a day (BID) | ORAL | 6 refills | Status: DC

## 2016-12-20 MED ORDER — CALCIUM CARBONATE ANTACID 500 MG PO CHEW
CHEWABLE_TABLET | ORAL | Status: AC
  Administered 2016-12-20: 200 mg via ORAL
  Filled 2016-12-20: qty 1

## 2016-12-20 NOTE — ED Notes (Signed)
Patient transported to Ultrasound 

## 2016-12-20 NOTE — ED Notes (Signed)
PT also verbalized she has not felt the baby move since the pains began. PT requesting Ultrasound

## 2016-12-20 NOTE — ED Triage Notes (Signed)
PT to ED reporting centralized chest pain and epigastric pain beginning two hours ago. Pt reports pain radiates to left side of jaw and into left  Shoulder and arm. PT reports's having dizziness and lightheadedness for the past two hours. Pt reports having a hx of Mitral valve prolapse and angina as well as reporting" they had to shock my heart to get it back into rhythm." Pt is also breathing heavy in triage and reports having trouble taking a breath but reports this could also be due to "stress and worry."   No fevers or cough reported. PT is currently pregnant.

## 2016-12-20 NOTE — ED Provider Notes (Signed)
[facility] Emergency Department Provider Note    First MD Initiated Contact with Patient 12/20/16 1703     (approximate)  I have reviewed the triage vital signs and the nursing notes.   HISTORY  Chief Complaint Chest Pain    HPI Breanna Mathews is a 31 y.o. female presents with epigastric pain and chest pain that started roughly 2 hours ago. Since the pain did radiate to her left shoulder and jaw. No exertional dyspnea. No exertional pain. States she is also feeling lightheaded and dizzy. Start feeling anxious as well. Denies any lower extremity swelling. Denies any pain when taking a deep breath. States that she's been having to go to the hospital multiple times in the past several weeks for recurrent hyperemesis.Has had a history of preeclampsia and is currently on Normodyne. Is currently taking Subutex for chronic opioid use. Does have a history of anxiety she states but has not been able to take any medications because they told her that she could either take something for her pain with her anxiety but not both.  Past Medical History:  Diagnosis Date  . Angina of effort (HCC)   . Anxiety   . Bipolar disorder with depression (HCC)   . Chronic back pain   . Hypertension   . Mitral valve prolapse    History reviewed. No pertinent family history. Past Surgical History:  Procedure Laterality Date  . BACK SURGERY     Patient Active Problem List   Diagnosis Date Noted  . Drug-seeking behavior 07/30/2016  . Opioid use agreement exists 07/30/2016  . Acute low back pain 06/19/2016  . Chronic jaw pain 01/16/2016  . Oropharyngeal lesion 01/16/2016  . Mid sternal chest pain 10/04/2015  . Bipolar disorder with depression (HCC) 09/03/2015  . 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 Spell of altered consciousness 05/15/2015  . Dependence on nicotine from cigarettes 04/10/2015  . History of hypertension 04/10/2015  . Intentional fentanyl overdose (HCC)  04/10/2015  . Sinus tachycardia 04/10/2015  . Suicidal ideation 04/10/2015  . Chronic pain disorder 04/09/2015  . Dissociative episodes 04/09/2015  . Mood disorder (HCC) 04/09/2015  . Opioid use disorder, severe, dependence (HCC) 04/09/2015  . Chronic bilateral low back pain with bilateral sciatica 03/08/2015  . Borderline personality disorder 01/16/2015  . 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 Uncomplicated opioid dependence (HCC) 06/22/2014  . Essential (primary) hypertension 03/17/2013  . Migraine with aura and without status migrainosus, not intractable 03/17/2013  . Other specified anxiety disorders 03/17/2013  . Spondylosis of cervical region without myelopathy or radiculopathy 07/11/2012  . Spondylosis of thoracic region without myelopathy or radiculopathy 07/11/2012  . Cannabis abuse, uncomplicated 11/27/2011    Prior to Admission medications   Medication Sig Start Date End Date Taking? Authorizing Provider  buprenorphine (SUBUTEX) 8 MG SUBL SL tablet Place 4 mg under the tongue 3 (three) times daily. 11/02/16   [provider]  labetalol (NORMODYNE) 100 MG tablet Take 2 tablets (200 mg total) by mouth 2 (two) times daily. 12/20/16   Willy Eddy, MD  lidocaine (LIDODERM) 5 % Place 1 patch onto the skin every 12 (twelve) hours. Remove & Discard patch within 12 hours or as directed by MD Patient not taking: Reported on 10/19/2016 07/21/16 07/21/17  Enid Derry, PA-C  metoCLOPramide (REGLAN) 10 MG tablet Take 1 tablet (10 mg total) by mouth every 8 (eight) hours as needed for nausea. 11/13/16 11/16/16  Nita Sickle, MD  metoCLOPramide (REGLAN) 10 MG tablet Take 1 tablet (10 mg total) by mouth 3 (three) times daily with meals. 11/17/16 12/17/16  Darci CurrentBrown, Kihei N, MD  ondansetron (ZOFRAN ODT) 8 MG disintegrating tablet Take 1 tablet (8 mg total) by mouth every 8 (eight) hours as needed for nausea or vomiting. 11/17/16   Ward,  Elenora Fenderhelsea C, MD  promethazine (PHENERGAN) 25 MG suppository Place 1 suppository (25 mg total) rectally every 8 (eight) hours as needed for nausea or vomiting. 10/19/16 10/29/16  Tresea MallGledhill, Jane, CNM  promethazine (PHENERGAN) 25 MG suppository Place 1 suppository (25 mg total) rectally every 6 (six) hours as needed for nausea. 11/17/16 11/17/17  Ward, Elenora Fenderhelsea C, MD  pyridOXINE (VITAMIN B-6) 50 MG tablet Take 1 tablet (50 mg total) by mouth 3 (three) times daily. 11/17/16   Ward, Elenora Fenderhelsea C, MD  sertraline (ZOLOFT) 50 MG tablet Take 150 mg by mouth daily. 09/07/16   [provider]    Allergies Oxymorphone; Keflex [cephalexin]; Lithium; and Opana [oxymorphone hcl]    Social History Social History  Substance Use Topics  . Smoking status: Current Every Day Smoker    Packs/day: 0.50  . Smokeless tobacco: Never Used  . Alcohol use No    Review of Systems Patient denies headaches, rhinorrhea, blurry vision, numbness, shortness of breath, chest pain, edema, cough, abdominal pain, nausea, vomiting, diarrhea, dysuria, fevers, rashes or hallucinations unless otherwise stated above in HPI. ____________________________________________   PHYSICAL EXAM:  VITAL SIGNS: Vitals:   12/20/16 1845 12/20/16 2015  BP: (!) 151/92 (!) 144/95  Pulse: 89 78  Resp: (!) 24 20  Temp:      Constitutional: Alert and oriented. Anxious appearing but in no acute distress. Eyes: Conjunctivae are normal.  Head: Atraumatic. Nose: No congestion/rhinnorhea. Mouth/Throat: Mucous membranes are moist.   Neck: No stridor. Painless ROM.  Cardiovascular: Normal rate, regular rhythm. Grossly normal heart sounds.  Good peripheral circulation. Respiratory: Normal respiratory effort.  No retractions. Lungs CTAB. Gastrointestinal: gravid soft and nontender. No distention. No abdominal bruits. No CVA tenderness. Musculoskeletal: No lower extremity tenderness nor edema.  No joint effusions. Neurologic:  Normal speech and  language. No gross focal neurologic deficits are appreciated. No facial droop Skin:  Skin is warm, dry and intact. No rash noted. Psychiatric: anxious but otherwise normal. Speech and behavior are normal.  ____________________________________________   LABS (all labs ordered are listed, but only abnormal results are displayed)  Results for orders placed or performed during the hospital encounter of 12/20/16 (from the past 24 hour(s))  Basic metabolic panel     Status: Abnormal   Collection Time: 12/20/16  4:16 PM  Result Value Ref Range   Sodium 134 (L) 135 - 145 mmol/L   Potassium 3.7 3.5 - 5.1 mmol/L   Chloride 108 101 - 111 mmol/L   CO2 20 (L) 22 - 32 mmol/L   Glucose, Bld 86 65 - 99 mg/dL   BUN 6 6 - 20 mg/dL   Creatinine, Ser 2.720.55 0.44 - 1.00 mg/dL   Calcium 9.2 8.9 - 53.610.3 mg/dL   GFR calc non Af Amer >60 >60 mL/min   GFR calc Af Amer >60 >60 mL/min   Anion gap 6 5 - 15  CBC     Status: Abnormal   Collection Time: 12/20/16  4:16 PM  Result Value Ref Range   WBC 17.6 (H) 3.6 - 11.0 K/uL   RBC 4.02 3.80 - 5.20 MIL/uL   Hemoglobin 11.9 (L) 12.0 - 16.0 g/dL  HCT 34.3 (L) 35.0 - 47.0 %   MCV 85.5 80.0 - 100.0 fL   MCH 29.5 26.0 - 34.0 pg   MCHC 34.6 32.0 - 36.0 g/dL   RDW 81.1 91.4 - 78.2 %   Platelets 260 150 - 440 K/uL  Troponin I     Status: None   Collection Time: 12/20/16  4:16 PM  Result Value Ref Range   Troponin I <0.03 <0.03 ng/mL  Brain natriuretic peptide     Status: None   Collection Time: 12/20/16  4:16 PM  Result Value Ref Range   B Natriuretic Peptide 22.0 0.0 - 100.0 pg/mL  Urinalysis, Complete w Microscopic     Status: Abnormal   Collection Time: 12/20/16  6:53 PM  Result Value Ref Range   Color, Urine YELLOW (A) YELLOW   APPearance HAZY (A) CLEAR   Specific Gravity, Urine 1.009 1.005 - 1.030   pH 7.0 5.0 - 8.0   Glucose, UA NEGATIVE NEGATIVE mg/dL   Hgb urine dipstick NEGATIVE NEGATIVE   Bilirubin Urine NEGATIVE NEGATIVE   Ketones, ur NEGATIVE  NEGATIVE mg/dL   Protein, ur NEGATIVE NEGATIVE mg/dL   Nitrite NEGATIVE NEGATIVE   Leukocytes, UA NEGATIVE NEGATIVE   RBC / HPF 0-5 0 - 5 RBC/hpf   WBC, UA 0-5 0 - 5 WBC/hpf   Bacteria, UA MANY (A) NONE SEEN   Squamous Epithelial / LPF 0-5 (A) NONE SEEN  Fibrin derivatives D-Dimer ([facility] only)     Status: None   Collection Time: 12/20/16  6:53 PM  Result Value Ref Range   Fibrin derivatives D-dimer ([facility]) 323.73 0.00 - 499.00   ____________________________________________  EKG My review and personal interpretation at Time: 16:16   Indication: chest discomfort  Rate: 85  Rhythm: sinus Axis: normal  Other: normal intervals, no right heart strain, no st elevations ____________________________________________  RADIOLOGY  I personally reviewed all radiographic images ordered to evaluate for the above acute complaints and reviewed radiology reports and findings.  These findings were personally discussed with the patient.  Please see medical record for radiology report.  ____________________________________________   PROCEDURES  Procedure(s) performed:  Procedures    Critical Care performed: no ____________________________________________   INITIAL IMPRESSION / ASSESSMENT AND PLAN / ED COURSE  Pertinent labs & imaging results that were available during my care of the patient were reviewed by me and considered in my medical decision making (see chart for details).  DDX: gastritis, pre-eclampsia, htn, chf, bronchitis, anxiety  Wendelin Deckman is a 31 y.o. who presents to the ED with chief complaint as described above. Patient relatively well appearing and in no acute distress. We'll order blood work to evaluate for above differential. Patient has no objective findings other than elevated blood pressure to suggest preeclampsia but does have significant risk factors of this in the past. EKG shows no evidence of acute ischemia. Her abdominal exam is soft and benign.  Clinical  Course as of Dec 20 2104  Wynelle Link Dec 20, 2016  [id number] Patient reassessed and states that this discomfort is worsening. Will give her another dose of IV labetalol and her evening dose of labetalol. Will also order her home Subutex. This point as I do not have better explanation for his symptoms do feel that workup of pulmonary embolism is indicated.  [PR]  1946 D-dimer is negative.  BP still elevated  [PR]    Clinical Course User Index [PR] Willy Eddy, MD   ----------------------------------------- 8:47 PM on 12/20/2016 -----------------------------------------  Spoke with  Dr. Tiburcio Pea of OB/GYN regarding the patient's findings. She has no protein in her urine and has a previous diagnosis of elevated blood pressure has recommended increase oral labetalol to 200 mg twice a day. Patient symptomatically improved and in no acute distress.  As she has improved, I do feel patient is stable for follow-up with PCP.  Discussed signs and symptoms for which patient should return immediately to hospital.  ____________________________________________   FINAL CLINICAL IMPRESSION(S) / ED DIAGNOSES  Final diagnoses:  Chest pain, unspecified type  [redacted] weeks gestation of pregnancy  Hypertension, unspecified type      NEW MEDICATIONS STARTED DURING THIS VISIT:  Current Discharge Medication List       Note:  This document was prepared using Dragon voice recognition software and may include unintentional dictation errors.    Willy Eddy, MD 12/20/16 2106

## 2016-12-26 ENCOUNTER — Encounter: Payer: Self-pay | Admitting: *Deleted

## 2016-12-26 ENCOUNTER — Emergency Department
Admission: EM | Admit: 2016-12-26 | Discharge: 2016-12-26 | Disposition: A | Discharge status: Home / Self Care | Payer: Managed Care, Other (non HMO) | Attending: Emergency Medicine | Admitting: Emergency Medicine

## 2016-12-26 ENCOUNTER — Other Ambulatory Visit: Payer: Self-pay

## 2016-12-26 DIAGNOSIS — Z3A21 21 weeks gestation of pregnancy: Secondary | ICD-10-CM | POA: Insufficient documentation

## 2016-12-26 DIAGNOSIS — O99332 Smoking (tobacco) complicating pregnancy, second trimester: Secondary | ICD-10-CM | POA: Diagnosis not present

## 2016-12-26 DIAGNOSIS — F17210 Nicotine dependence, cigarettes, uncomplicated: Secondary | ICD-10-CM | POA: Diagnosis not present

## 2016-12-26 DIAGNOSIS — O10012 Pre-existing essential hypertension complicating pregnancy, second trimester: Secondary | ICD-10-CM | POA: Insufficient documentation

## 2016-12-26 DIAGNOSIS — G8929 Other chronic pain: Secondary | ICD-10-CM | POA: Insufficient documentation

## 2016-12-26 DIAGNOSIS — I10 Essential (primary) hypertension: Secondary | ICD-10-CM

## 2016-12-26 DIAGNOSIS — R079 Chest pain, unspecified: Secondary | ICD-10-CM | POA: Insufficient documentation

## 2016-12-26 DIAGNOSIS — Z3492 Encounter for supervision of normal pregnancy, unspecified, second trimester: Secondary | ICD-10-CM

## 2016-12-26 DIAGNOSIS — O9989 Other specified diseases and conditions complicating pregnancy, childbirth and the puerperium: Secondary | ICD-10-CM | POA: Insufficient documentation

## 2016-12-26 LAB — HEPATIC FUNCTION PANEL
ALK PHOS: 53 U/L (ref 38–126)
ALT: 12 U/L — ABNORMAL LOW (ref 14–54)
AST: 19 U/L (ref 15–41)
Albumin: 3.2 g/dL — ABNORMAL LOW (ref 3.5–5.0)
Bilirubin, Direct: <0.1 mg/dL — ABNORMAL LOW (ref 0.1–0.5)
Total Bilirubin: 0.4 mg/dL (ref 0.3–1.2)
Total Protein: 6.8 g/dL (ref 6.5–8.1)

## 2016-12-26 LAB — CBC
HCT: 34.4 % — ABNORMAL LOW (ref 35.0–47.0)
Hemoglobin: 11.7 g/dL — ABNORMAL LOW (ref 12.0–16.0)
MCH: 29.6 pg (ref 26.0–34.0)
MCHC: 34.1 g/dL (ref 32.0–36.0)
MCV: 86.8 fL (ref 80.0–100.0)
PLATELETS: 251 10*3/uL (ref 150–440)
RBC: 3.97 MIL/uL (ref 3.80–5.20)
RDW: 14.3 % (ref 11.5–14.5)
WBC: 16.3 10*3/uL — AB (ref 3.6–11.0)

## 2016-12-26 LAB — URINALYSIS, COMPLETE (UACMP) WITH MICROSCOPIC
Bilirubin Urine: NEGATIVE
GLUCOSE, UA: NEGATIVE mg/dL
Hgb urine dipstick: NEGATIVE
Ketones, ur: NEGATIVE mg/dL
Leukocytes, UA: NEGATIVE
Nitrite: NEGATIVE
PH: 6 (ref 5.0–8.0)
Protein, ur: NEGATIVE mg/dL
Specific Gravity, Urine: 1.023 (ref 1.005–1.030)

## 2016-12-26 LAB — BASIC METABOLIC PANEL
Anion gap: 7 (ref 5–15)
BUN: 8 mg/dL (ref 6–20)
CALCIUM: 9.1 mg/dL (ref 8.9–10.3)
CHLORIDE: 104 mmol/L (ref 101–111)
CO2: 23 mmol/L (ref 22–32)
CREATININE: 0.53 mg/dL (ref 0.44–1.00)
GFR calc Af Amer: >60 mL/min (ref >60)
Glucose, Bld: 91 mg/dL (ref 65–99)
Potassium: 3.8 mmol/L (ref 3.5–5.1)
SODIUM: 134 mmol/L — AB (ref 135–145)

## 2016-12-26 LAB — TROPONIN I: Troponin I: <0.03 ng/mL (ref <0.03)

## 2016-12-26 LAB — HCG, QUANTITATIVE, PREGNANCY: hCG, Beta Chain, Quant, S: 11030 m[IU]/mL — ABNORMAL HIGH (ref <5)

## 2016-12-26 MED ORDER — FAMOTIDINE IN NACL 20-0.9 MG/50ML-% IV SOLN
20.0000 mg | Freq: Once | INTRAVENOUS | Status: AC
  Administered 2016-12-26: 20 mg via INTRAVENOUS
  Filled 2016-12-26: qty 50

## 2016-12-26 MED ORDER — DEXTROSE-NACL 5-0.9 % IV SOLN
INTRAVENOUS | Status: DC
  Administered 2016-12-26: 20:00:00 via INTRAVENOUS

## 2016-12-26 MED ORDER — PROMETHAZINE HCL 25 MG PO TABS: 25.0000 mg | ORAL_TABLET | Freq: Four times a day (QID) | ORAL | 0 refills | Status: DC | PRN

## 2016-12-26 MED ORDER — PROMETHAZINE HCL 25 MG/ML IJ SOLN
25.0000 mg | Freq: Once | INTRAMUSCULAR | Status: AC
  Administered 2016-12-26: 25 mg via INTRAVENOUS
  Filled 2016-12-26: qty 1

## 2016-12-26 MED ORDER — BUPRENORPHINE HCL-NALOXONE HCL 8-2 MG SL SUBL
1.0000 | SUBLINGUAL_TABLET | Freq: Every day | SUBLINGUAL | Status: DC
  Administered 2016-12-26: 1 via SUBLINGUAL

## 2016-12-26 MED ORDER — RANITIDINE HCL 150 MG PO TABS: 150.0000 mg | ORAL_TABLET | Freq: Two times a day (BID) | ORAL | 1 refills | Status: DC

## 2016-12-26 NOTE — ED Triage Notes (Signed)
Pt presented earlier this evening with hypertension and chest pain; pt is [id number] pregnant; G5P2SA2; was taken upstairs to be monitored for a short time; BP upstairs 149/81; pt's 2 live births were pre-term due to pre-eclampsia; pt was released from upstairs due to gestation and brought down to ED for treatment of HTN; pt reports central chest pressure radiating up into left side of her neck and left shoulder; "seeing spots" intermittently; reports frontal headache moving to right side of her head; pt awake and alert; talking in complete coherent sentences; alert and oriented x 3

## 2016-12-26 NOTE — OB Triage Note (Signed)
Pt. States her BP at home was 214/140. Reports seeing spots, abdominal pain, dizziness, chest pain, nausea. Denies epigastric pain. Started approx 0800 this am worsening throughout the day. Elaina HoopsElks, Angelice Piech S

## 2016-12-26 NOTE — ED Notes (Signed)

## 2016-12-26 NOTE — ED Provider Notes (Signed)
[facility] Emergency Department Provider Note       Time seen: ----------------------------------------- 7:35 PM on 12/26/2016 -----------------------------------------     I have reviewed the triage vital signs and the nursing notes.   HISTORY   Chief Complaint Hypertension and Chest Pain    HPI Breanna Mathews is a 31 y.o. female who presents to the ED for hypertension and chest pain. Patient states she is 21 weeks and 2 days pregnant. She is G5 P2 AB 2. Patient was taken upstairs today to be monitored for a short period of time. Blood pressure upstairs at labor and delivery was 149/81. She has had 2 preterm lab birth secondary to preeclampsia. Patient was encouraged to come to the ER for treatment of hypertension and released from labor and delivery. She does report central chest pressure radiating to the left side of her neck and left shoulder. She also had frontal headache, persistent vomiting.   Past Medical History:  Diagnosis Date  . Angina of effort (HCC)   . Anxiety   . Bipolar disorder with depression (HCC)   . Chronic back pain   . Hypertension   . Mitral valve prolapse     Patient Active Problem List   Diagnosis Date Noted  . Drug-seeking behavior 07/30/2016  . Opioid use agreement exists 07/30/2016  . Acute low back pain 06/19/2016  . Chronic jaw pain 01/16/2016  . Oropharyngeal lesion 01/16/2016  . Mid sternal chest pain 10/04/2015  . Bipolar disorder with depression (HCC) 09/03/2015  . 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 Spell of altered consciousness 05/15/2015  . Dependence on nicotine from cigarettes 04/10/2015  . History of hypertension 04/10/2015  . Intentional fentanyl overdose (HCC) 04/10/2015  . Sinus tachycardia 04/10/2015  . Suicidal ideation 04/10/2015  . Chronic pain disorder 04/09/2015  . Dissociative episodes 04/09/2015  . Mood disorder (HCC) 04/09/2015  . Opioid use disorder, severe, dependence  (HCC) 04/09/2015  . Chronic bilateral low back pain with bilateral sciatica 03/08/2015  . Borderline personality disorder 01/16/2015  . 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 Uncomplicated opioid dependence (HCC) 06/22/2014  . Essential (primary) hypertension 03/17/2013  . Migraine with aura and without status migrainosus, not intractable 03/17/2013  . Other specified anxiety disorders 03/17/2013  . Spondylosis of cervical region without myelopathy or radiculopathy 07/11/2012  . Spondylosis of thoracic region without myelopathy or radiculopathy 07/11/2012  . Cannabis abuse, uncomplicated 11/27/2011    Past Surgical History:  Procedure Laterality Date  . BACK SURGERY    . CESAREAN SECTION      Allergies Oxymorphone; Keflex [cephalexin]; Lithium; and Opana [oxymorphone hcl]  Social History Social History  Substance Use Topics  . Smoking status: Current Every Day Smoker    Packs/day: 0.00    Types: Cigarettes  . Smokeless tobacco: Never Used     Comment: 2 cigarettes/day  . Alcohol use No    Review of Systems Constitutional: Negative for fever. Eyes: Negative for vision changes ENT:  Negative for congestion, sore throat Cardiovascular: Positive for chest pain Respiratory: Negative for shortness of breath. Gastrointestinal: Negative for abdominal pain, positive for vomiting Genitourinary: Negative for dysuria. Musculoskeletal: Positive for back pain Skin: Negative for rash. Neurological: Positive for headache  All systems negative/normal/unremarkable except as stated in the HPI  ____________________________________________   PHYSICAL EXAM:  VITAL SIGNS: ED Triage Vitals  Enc Vitals Group     BP 12/26/16 1755 (!) 157/75     Pulse Rate 12/26/16  1755 95     Resp 12/26/16 1755 16     Temp 12/26/16 1755 97.6 F (36.4 C)     Temp Source 12/26/16 1755 Oral     SpO2 12/26/16 1825 97 %     Weight 12/26/16 1821 217 lb (98.4 kg)      Height 12/26/16 1821 5\' 10"  (1.778 m)     Head Circumference --      Peak Flow --      Pain Score 12/26/16 1823 6     Pain Loc --      Pain Edu? --      Excl. in GC? --     Constitutional: Alert and oriented. Well appearing and in no distress. Eyes: Conjunctivae are normal. Normal extraocular movements. ENT   Head: Normocephalic and atraumatic.   Nose: No congestion/rhinnorhea.   Mouth/Throat: Mucous membranes are moist.   Neck: No stridor. Cardiovascular: Normal rate, regular rhythm. No murmurs, rubs, or gallops. Respiratory: Normal respiratory effort without tachypnea nor retractions. Breath sounds are clear and equal bilaterally. No wheezes/rales/rhonchi. Gastrointestinal: Soft and nontender. Normal bowel sounds Musculoskeletal: Nontender with normal range of motion in extremities. No lower extremity tenderness nor edema. Neurologic:  Normal speech and language. No gross focal neurologic deficits are appreciated.  Skin:  Skin is warm, dry and intact. No rash noted. Psychiatric: Mood and affect are normal. Speech and behavior are normal.  ____________________________________________  EKG: Interpreted by me. Sinus rhythm rate 84 bpm, normal PR, normal QRS, normal QT.  ____________________________________________  ED COURSE:  Pertinent labs & imaging results that were available during my care of the patient were reviewed by me and considered in my medical decision making (see chart for details). Patient presents for hypertension in pregnancy with possible preeclampsia, we will assess with labs and imaging as indicated. Clinical Course as of Dec 26 2136  Sat Dec 26, 2016  2032 Current blood pressure 135/77  [JW]    Clinical Course User Index [JW] Emily Filbert, MD   Procedures ____________________________________________   LABS (pertinent positives/negatives)  Labs Reviewed  BASIC METABOLIC PANEL - Abnormal; Notable for the following:       Result  Value   Sodium 134 (*)    All other components within normal limits  CBC - Abnormal; Notable for the following:    WBC 16.3 (*)    Hemoglobin 11.7 (*)    HCT 34.4 (*)    All other components within normal limits  HCG, QUANTITATIVE, PREGNANCY - Abnormal; Notable for the following:    hCG, Beta Chain, Quant, S 11,030 (*)    All other components within normal limits  HEPATIC FUNCTION PANEL - Abnormal; Notable for the following:    Albumin 3.2 (*)    ALT 12 (*)    Bilirubin, Direct <0.1 (*)    All other components within normal limits  URINALYSIS, COMPLETE (UACMP) WITH MICROSCOPIC - Abnormal; Notable for the following:    Color, Urine YELLOW (*)    APPearance HAZY (*)    Bacteria, UA FEW (*)    Squamous Epithelial / LPF 0-5 (*)    All other components within normal limits  TROPONIN I   ____________________________________________  FINAL ASSESSMENT AND PLAN  Hypertension, second trimester pregnancy  Plan: Patient's labs and imaging were dictated above. Patient had presented for elevated blood pressure readings which subsequently improved. Currently blood pressure is 142/77. She has no other signs of preeclampsia including edema and proteinuria. Some of this may be medication related,  she had not had her dose of Subutex this evening and she also been vomiting and exhibits signs of GERD. I will place her on an antacid as well as prescribe more Phenergan for her. She does not require hospitalization. Labor and delivery has cleared her from a GYN perspective. I have [facility]   Emily FilbertWilliams, Antonetta Clanton E, MD   Note: This note was generated in part or whole with voice recognition software. Voice recognition is usually quite accurate but there are transcription errors that can and very often do occur. I apologize for any typographical errors that were not detected and corrected.     Emily FilbertWilliams, Giordana Weinheimer E, MD 12/26/16 2141

## 2017-01-09 ENCOUNTER — Emergency Department
Admission: EM | Admit: 2017-01-09 | Discharge: 2017-01-09 | Disposition: A | Discharge status: Home / Self Care | Payer: Managed Care, Other (non HMO) | Attending: Emergency Medicine | Admitting: Emergency Medicine

## 2017-01-09 ENCOUNTER — Encounter: Payer: Self-pay | Admitting: *Deleted

## 2017-01-09 ENCOUNTER — Emergency Department: Payer: Managed Care, Other (non HMO)

## 2017-01-09 DIAGNOSIS — O99342 Other mental disorders complicating pregnancy, second trimester: Secondary | ICD-10-CM | POA: Diagnosis not present

## 2017-01-09 DIAGNOSIS — O99332 Smoking (tobacco) complicating pregnancy, second trimester: Secondary | ICD-10-CM | POA: Insufficient documentation

## 2017-01-09 DIAGNOSIS — F1120 Opioid dependence, uncomplicated: Secondary | ICD-10-CM | POA: Diagnosis not present

## 2017-01-09 DIAGNOSIS — Z6832 Body mass index (BMI) 32.0-32.9, adult: Secondary | ICD-10-CM | POA: Diagnosis not present

## 2017-01-09 DIAGNOSIS — Z3A22 22 weeks gestation of pregnancy: Secondary | ICD-10-CM | POA: Insufficient documentation

## 2017-01-09 DIAGNOSIS — F17210 Nicotine dependence, cigarettes, uncomplicated: Secondary | ICD-10-CM | POA: Insufficient documentation

## 2017-01-09 DIAGNOSIS — Z7982 Long term (current) use of aspirin: Secondary | ICD-10-CM | POA: Diagnosis not present

## 2017-01-09 DIAGNOSIS — R1010 Upper abdominal pain, unspecified: Secondary | ICD-10-CM | POA: Diagnosis not present

## 2017-01-09 DIAGNOSIS — R109 Unspecified abdominal pain: Secondary | ICD-10-CM

## 2017-01-09 DIAGNOSIS — F419 Anxiety disorder, unspecified: Secondary | ICD-10-CM | POA: Insufficient documentation

## 2017-01-09 DIAGNOSIS — E669 Obesity, unspecified: Secondary | ICD-10-CM | POA: Diagnosis not present

## 2017-01-09 DIAGNOSIS — O26892 Other specified pregnancy related conditions, second trimester: Secondary | ICD-10-CM

## 2017-01-09 DIAGNOSIS — Z765 Malingerer [conscious simulation]: Secondary | ICD-10-CM | POA: Diagnosis not present

## 2017-01-09 DIAGNOSIS — O26899 Other specified pregnancy related conditions, unspecified trimester: Secondary | ICD-10-CM | POA: Diagnosis present

## 2017-01-09 DIAGNOSIS — Z79899 Other long term (current) drug therapy: Secondary | ICD-10-CM | POA: Diagnosis not present

## 2017-01-09 DIAGNOSIS — O9989 Other specified diseases and conditions complicating pregnancy, childbirth and the puerperium: Secondary | ICD-10-CM | POA: Diagnosis not present

## 2017-01-09 DIAGNOSIS — R1013 Epigastric pain: Secondary | ICD-10-CM | POA: Insufficient documentation

## 2017-01-09 DIAGNOSIS — O162 Unspecified maternal hypertension, second trimester: Secondary | ICD-10-CM | POA: Insufficient documentation

## 2017-01-09 HISTORY — DX: Gastro-esophageal reflux disease without esophagitis: K21.9

## 2017-01-09 LAB — CBC
HCT: 32.8 % — ABNORMAL LOW (ref 35.0–47.0)
Hemoglobin: 11.4 g/dL — ABNORMAL LOW (ref 12.0–16.0)
MCH: 30.6 pg (ref 26.0–34.0)
MCHC: 34.8 g/dL (ref 32.0–36.0)
MCV: 87.9 fL (ref 80.0–100.0)
Platelets: 233 10*3/uL (ref 150–440)
RBC: 3.73 MIL/uL — AB (ref 3.80–5.20)
RDW: 14.1 % (ref 11.5–14.5)
WBC: 15.7 10*3/uL — AB (ref 3.6–11.0)

## 2017-01-09 LAB — URINALYSIS, ROUTINE W REFLEX MICROSCOPIC
Bilirubin Urine: NEGATIVE
Glucose, UA: NEGATIVE mg/dL
HGB URINE DIPSTICK: NEGATIVE
KETONES UR: NEGATIVE mg/dL
LEUKOCYTES UA: NEGATIVE
Nitrite: NEGATIVE
PROTEIN: NEGATIVE mg/dL
Specific Gravity, Urine: 1.013 (ref 1.005–1.030)
pH: 7 (ref 5.0–8.0)

## 2017-01-09 LAB — COMPREHENSIVE METABOLIC PANEL
ALT: 11 U/L — AB (ref 14–54)
AST: 18 U/L (ref 15–41)
Albumin: 3.2 g/dL — ABNORMAL LOW (ref 3.5–5.0)
Alkaline Phosphatase: 56 U/L (ref 38–126)
Anion gap: 8 (ref 5–15)
BUN: 6 mg/dL (ref 6–20)
CHLORIDE: 106 mmol/L (ref 101–111)
CO2: 21 mmol/L — AB (ref 22–32)
CREATININE: 0.52 mg/dL (ref 0.44–1.00)
Calcium: 9.2 mg/dL (ref 8.9–10.3)
GFR calc non Af Amer: >60 mL/min (ref >60)
Glucose, Bld: 149 mg/dL — ABNORMAL HIGH (ref 65–99)
POTASSIUM: 3.4 mmol/L — AB (ref 3.5–5.1)
SODIUM: 135 mmol/L (ref 135–145)
Total Bilirubin: 0.2 mg/dL — ABNORMAL LOW (ref 0.3–1.2)
Total Protein: 6.4 g/dL — ABNORMAL LOW (ref 6.5–8.1)

## 2017-01-09 LAB — LIPASE, BLOOD: Lipase: 23 U/L (ref 11–51)

## 2017-01-09 LAB — URINE DRUG SCREEN, QUALITATIVE (ARMC ONLY)
Amphetamines, Ur Screen: NOT DETECTED
BARBITURATES, UR SCREEN: NOT DETECTED
BENZODIAZEPINE, UR SCRN: NOT DETECTED
Cannabinoid 50 Ng, Ur ~~LOC~~: NOT DETECTED
Cocaine Metabolite,Ur ~~LOC~~: NOT DETECTED
MDMA (Ecstasy)Ur Screen: NOT DETECTED
METHADONE SCREEN, URINE: NOT DETECTED
Opiate, Ur Screen: NOT DETECTED
Phencyclidine (PCP) Ur S: NOT DETECTED
TRICYCLIC, UR SCREEN: NOT DETECTED

## 2017-01-09 LAB — AMYLASE: AMYLASE: 22 U/L — AB (ref 28–100)

## 2017-01-09 LAB — TROPONIN I: Troponin I: <0.03 ng/mL (ref <0.03)

## 2017-01-09 MED ORDER — BUPRENORPHINE HCL-NALOXONE HCL 8-2 MG SL SUBL
1.0000 | SUBLINGUAL_TABLET | Freq: Once | SUBLINGUAL | Status: AC
  Administered 2017-01-09: 1 via SUBLINGUAL
  Filled 2017-01-09: qty 1

## 2017-01-09 MED ORDER — LACTATED RINGERS IV SOLN: 500.0000 mL | INTRAVENOUS | Status: DC | PRN

## 2017-01-09 NOTE — ED Notes (Signed)
Patient transported to Ultrasound, unable to complete EKG

## 2017-01-09 NOTE — ED Provider Notes (Addendum)
[facility] Emergency Department Provider Note  ____________________________________________   I have reviewed the triage vital signs and the nursing notes.   HISTORY  Chief Complaint Abdominal Pain    HPI Breanna Mathews is a 31 y.o. female with a history of drug-seeking behavior, opioid use agreement, chronic abdominal pain, chronic back pain, chronic jaw pain, [redacted] weeks pregnant, who has epigastric abdominal discomfort now for 3 days straight. Patient has been seen for this in the past. Nothing makes the pain better nothing makes the pain worse. She was seen and evaluated upstairs by OB they apparently did determine that there is no issue with the baby and sent her down here for her home dose of Suboxone, which patient is insisting I should give her and further evaluation of possibly of pancreatitis. Patient has a negative lipase and a negative amylase. Patient states if I do not give her Suboxone she will have a heart issue which will cause her to need to be shocked and I must give her Suboxone right now that she is "past due". She has been informed that this is something we regularly do out of the department. Pain itself is nonradiating constant epigastric abdominal pain. As noted patient has had this before. She states she did not run out of her Suboxone she still has it at home she just didn't bring it with her.  No vaginal bleeding or gush of fluid no lower abdominal pain no vomiting no fever, and the truck did pain in the epigastric region for 3 days straight.   Past Medical History:  Diagnosis Date  . Angina of effort (HCC)   . Anxiety   . Bipolar disorder with depression (HCC)   . Chronic back pain   . GERD (gastroesophageal reflux disease)   . Hypertension   . Mitral valve prolapse     Patient Active Problem List   Diagnosis Date Noted  . Abdominal pain in pregnancy 01/09/2017  . Drug-seeking behavior 07/30/2016  . Opioid use agreement exists  07/30/2016  . Acute low back pain 06/19/2016  . Chronic jaw pain 01/16/2016  . Oropharyngeal lesion 01/16/2016  . Mid sternal chest pain 10/04/2015  . Bipolar disorder with depression (HCC) 09/03/2015  . 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 Spell of altered consciousness 05/15/2015  . Dependence on nicotine from cigarettes 04/10/2015  . History of hypertension 04/10/2015  . Intentional fentanyl overdose (HCC) 04/10/2015  . Sinus tachycardia 04/10/2015  . Suicidal ideation 04/10/2015  . Chronic pain disorder 04/09/2015  . Dissociative episodes 04/09/2015  . Mood disorder (HCC) 04/09/2015  . Opioid use disorder, severe, dependence (HCC) 04/09/2015  . Chronic bilateral low back pain with bilateral sciatica 03/08/2015  . Borderline personality disorder 01/16/2015  . 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 Uncomplicated opioid dependence (HCC) 06/22/2014  . Essential (primary) hypertension 03/17/2013  . Migraine with aura and without status migrainosus, not intractable 03/17/2013  . Other specified anxiety disorders 03/17/2013  . Spondylosis of cervical region without myelopathy or radiculopathy 07/11/2012  . Spondylosis of thoracic region without myelopathy or radiculopathy 07/11/2012  . Cannabis abuse, uncomplicated 11/27/2011    Past Surgical History:  Procedure Laterality Date  . APPENDECTOMY    . BACK SURGERY    . CESAREAN SECTION    . KNEE ARTHROPLASTY Left    x 2  . POLYPECTOMY    . TONSILLECTOMY      Prior to Admission medications   Medication Sig Start  Date End Date Taking? Authorizing Provider  acetaminophen (TYLENOL) 500 MG tablet Take 1,000 mg by mouth 3 (three) times daily.    [provider]  aspirin 81 MG chewable tablet Chew 81 mg by mouth 2 (two) times daily.    [provider]  buprenorphine (SUBUTEX) 8 MG SUBL SL tablet Place 4 mg under the tongue 3 (three) times daily. 11/02/16   [provider]  diphenhydrAMINE (BENADRYL) 50 MG capsule Take 50 mg by mouth 3 (three) times daily.    [provider]  labetalol (NORMODYNE) 100 MG tablet Take 2 tablets (200 mg total) by mouth 2 (two) times daily. 12/20/16   Willy Eddy, MD  lamoTRIgine (LAMICTAL) 25 MG tablet Take 25 mg by mouth 2 (two) times daily.    [provider]  lidocaine (LIDODERM) 5 % Place 1 patch onto the skin every 12 (twelve) hours. Remove & Discard patch within 12 hours or as directed by MD Patient not taking: Reported on 10/19/2016 07/21/16 07/21/17  Enid Derry, PA-C  metoCLOPramide (REGLAN) 10 MG tablet Take 1 tablet (10 mg total) by mouth every 8 (eight) hours as needed for nausea. 11/13/16 11/16/16  Nita Sickle, MD  metoCLOPramide (REGLAN) 10 MG tablet Take 1 tablet (10 mg total) by mouth 3 (three) times daily with meals. 11/17/16 12/17/16  Darci Current, MD  ondansetron (ZOFRAN ODT) 8 MG disintegrating tablet Take 1 tablet (8 mg total) by mouth every 8 (eight) hours as needed for nausea or vomiting. Patient not taking: Reported on 12/26/2016 11/17/16   Ward, Elenora Fender, MD  promethazine (PHENERGAN) 25 MG suppository Place 1 suppository (25 mg total) rectally every 8 (eight) hours as needed for nausea or vomiting. 10/19/16 10/29/16  Tresea Mall, CNM  promethazine (PHENERGAN) 25 MG suppository Place 1 suppository (25 mg total) rectally every 6 (six) hours as needed for nausea. 11/17/16 11/17/17  Ward, Elenora Fender, MD  promethazine (PHENERGAN) 25 MG tablet Take 1 tablet (25 mg total) by mouth every 6 (six) hours as needed for nausea or vomiting. 12/26/16   Emily Filbert, MD  pyridOXINE (VITAMIN B-6) 50 MG tablet Take 1 tablet (50 mg total) by mouth 3 (three) times daily. 11/17/16   Ward, Elenora Fender, MD  ranitidine (ZANTAC) 150 MG tablet Take 1 tablet (150 mg total) by mouth 2 (two) times daily. 12/26/16 12/26/17  Emily Filbert, MD  sertraline (ZOLOFT) 50 MG tablet Take 150 mg by  mouth daily. 09/07/16   [provider]    Allergies Oxymorphone; Keflex [cephalexin]; Lithium; and Opana [oxymorphone hcl]  History reviewed. No pertinent family history.  Social History Social History  Substance Use Topics  . Smoking status: Current Every Day Smoker    Packs/day: 0.50    Types: Cigarettes  . Smokeless tobacco: Never Used     Comment: 2 cigarettes/day  . Alcohol use No    Review of Systems Constitutional: No fever/chills Eyes: No visual changes. ENT: No sore throat. No stiff neck no neck pain Cardiovascular: Denies chest pain. Respiratory: Denies shortness of breath. Gastrointestinal:   no vomiting.  No diarrhea.  No constipation. Genitourinary: Negative for dysuria. Musculoskeletal: Negative lower extremity swelling Skin: Negative for rash. Neurological: Negative for severe headaches, focal weakness or numbness.   ____________________________________________   PHYSICAL EXAM:  VITAL SIGNS: ED Triage Vitals  Enc Vitals Group     BP 01/09/17 2012 (!) 150/77     Pulse Rate 01/09/17 2012 94     Resp 01/09/17  2012 18     Temp 01/09/17 2012 98.2 F (36.8 C)     Temp Source 01/09/17 2012 Oral     SpO2 01/09/17 2151 98 %     Weight 01/09/17 2024 229 lb (103.9 kg)     Height 01/09/17 2024 5\' 10"  (1.778 m)     Head Circumference --      Peak Flow --      Pain Score 01/09/17 2024 7     Pain Loc --      Pain Edu? --      Excl. in GC? --     Constitutional: Alert and oriented. Well appearing and in no acute distress.They anxious Eyes: Conjunctivae are normal Head: Atraumatic HEENT: No congestion/rhinnorhea. Mucous membranes are moist.  Oropharynx non-erythematous Neck:   Nontender with no meningismus, no masses, no stridor Cardiovascular: Normal rate, regular rhythm. Grossly normal heart sounds.  Good peripheral circulation. Respiratory: Normal respiratory effort.  No retractions. Lungs CTAB. Abdominal: Soft and very slight epigastric  tenderness. No distention. No guarding no rebound gravid uterus noted which is nontender. Back:  There is no focal tenderness or step off.  there is no midline tenderness there are no lesions noted. there is no CVA tenderness Musculoskeletal: No lower extremity tenderness, no upper extremity tenderness. No joint effusions, no DVT signs strong distal pulses no edema Neurologic:  Normal speech and language. No gross focal neurologic deficits are appreciated.  Skin:  Skin is warm, dry and intact. No rash noted. Psychiatric: Mood and affect are anxious and upset about her Suboxone. Speech and behavior are normal.  ____________________________________________   LABS (all labs ordered are listed, but only abnormal results are displayed)  Labs Reviewed  CBC - Abnormal; Notable for the following:       Result Value   WBC 15.7 (*)    RBC 3.73 (*)    Hemoglobin 11.4 (*)    HCT 32.8 (*)    All other components within normal limits  URINALYSIS, ROUTINE W REFLEX MICROSCOPIC - Abnormal; Notable for the following:    Color, Urine YELLOW (*)    APPearance HAZY (*)    All other components within normal limits  COMPREHENSIVE METABOLIC PANEL - Abnormal; Notable for the following:    Potassium 3.4 (*)    CO2 21 (*)    Glucose, Bld 149 (*)    Total Protein 6.4 (*)    Albumin 3.2 (*)    ALT 11 (*)    Total Bilirubin 0.2 (*)    All other components within normal limits  AMYLASE - Abnormal; Notable for the following:    Amylase 22 (*)    All other components within normal limits  LIPASE, BLOOD  TROPONIN I   ____________________________________________  EKG  I personally interpreted any EKGs ordered by me or triage Sinus rhythm at 96 beats per an acute ST elevation no acute ST depression normal axis unremarkable EKG ____________________________________________  RADIOLOGY  I reviewed any imaging ordered by me or triage that were performed during my shift and, if possible, patient and/or family  made aware of any abnormal findings. ____________________________________________   PROCEDURES  Procedure(s) performed: None  Procedures  Critical Care performed: None  ____________________________________________   INITIAL IMPRESSION / ASSESSMENT AND PLAN / ED COURSE  Pertinent labs & imaging results that were available during my care of the patient were reviewed by me and considered in my medical decision making (see chart for details).  Patient with chronic abdominal pain presents today  with epigastric abdominal pain and essentially demanding that I give her Suboxone or she states she will go into a unsteady heart rhythm and I will have to shock her. She has been apparently promised that she will be given Suboxone in the emergency department by other providers. Usually, we do not give Suboxone in fact in the last 15 years I've never given an however, it is legal for me to give Suboxone the patient's during emergency situations and I suppose this may qualify. Patient does have ongoing alleged abdominal pain although there is no evidence of any significant abnormality on her blood work which was just performed, she has a history of significant chronic pain and drug-seeking behavior.. She has elevated white count which is consistent with pregnancy and multiple prior white counts. She has normal lipase normal amylase, normal liver function tests, no evidence of fact on her blood work despite 3 days of continuous pain of anything significant. Patient also has an EKG which is unremarkable there is no evidence of acute coronary syndrome but I will add a troponin is a precaution she will not require serial troponins, given that she has had interrupted pain for 3 days, in addition, I will obtain an ultrasound of her right upper quadrant although frankly I think most likely there is no significant likelihood of surgical disease present today.     ----------------------------------------- 11:15 PM on  01/09/2017 -----------------------------------------  Phone calls in the computer indicate that the patient was trying to pick up her medications from her pain clinic on Thursday instead of scheduled Monday because of transportation issues with her husband. She was told that that was not allowed and she was only allowed to get her perception on Monday, and that she would have to use or other mode of transportation. This was on the fifth of this month. Today she is here with abdominal pain and insisting that I give her Suboxone. It certainly is concerning for the possibility that she is out of her Suboxone at home although she states she is not. In any event, ultrasound shows gallstones with no evidence of cholecystitis, she has no focal right upper quadrant abdominal pain, and she would not be a surgical candidate given her pregnancy for elective cholecystectomy. Accordingly, we will have her follow-up as an outpatient for this and I will discharge her at this time with reassuring workup. Return precautions and follow-up given and understood. Abdomen is otherwise benign with nothing to suggest appendicitis or pregnancy related issue. ____________________________________________   FINAL CLINICAL IMPRESSION(S) / ED DIAGNOSES  Final diagnoses:  Abdominal pain      This chart was dictated using voice recognition software.  Despite best efforts to proofread,  errors can occur which can change meaning.      Jeanmarie Plant, MD 01/09/17 2222    Jeanmarie Plant, MD 01/09/17 2245    Jeanmarie Plant, MD 01/09/17 4401    Jeanmarie Plant, MD 01/09/17 2324

## 2017-01-09 NOTE — Final Progress Note (Addendum)
Physician Final Progress Note  Patient ID: Breanna Mathews MRN: [id number] DOB/AGE: 33/01/1986 32 y.o.  Admit date: 01/09/2017 Admitting provider: Jill Sideolleen L. Noelene Gang,CNM Discharge date: 01/09/2017   Admission Diagnoses: abdominal pain in pregnancy IUP at 22wk2day Discharge Diagnoses:  Epigastric pain IUP at 22wk2days  Consults: none  Significant Findings/ Diagnostic Studies: 31 year old G5 P0222 with EDC=05/13/2017 by LMP and c/w 8wk ultrasound presented with complaints of upper abdominal pain x 2 days. The upper abdominal pain is constant and worsenes periodically. Comes in waves. The pain is worse after eating. Nothing makes it better. She is nauseous "the whole pregnancy" and has not experienced increased nausea with the pain. Last ate this afternoon-small amt cheese and spinach ravioli. Took Zofran and phenergan prior to coming to the hospital. She also took Zantac for reflux. She is [facility] and she states she took 8 mgm four times a day and took a dose at 6Am and 1PM today. (review of record states she is prescribed 4mg m qid). She did not take another dose before she came to the hospital and she did not bring that with her. She requests another dose of the Subutex.  Denies diarrhea or constipation accompanying the pain. Last BM today was normal. No blood in stool. No fever or chills. Past abdominal surgery include an appendectomy and a Cesarean section. [facility] Her pregnancy is complicated by obesity, persistent hyperemesis, seizure disorder, chronic back pain, anxiety/depresssion, GERD, chronic hypertension and severe preeclampsia with preterm deliveries x 2 (last one was a Cesarean section).  ROS : see HPI Past Medical History:  Diagnosis Date  . Angina of effort (HCC)   . Anxiety   . Bipolar disorder with depression (HCC)   . Chronic back pain   . GERD (gastroesophageal reflux disease)   . Hypertension   . Mitral  valve prolapse    Past Surgical History:  Procedure Laterality Date  . APPENDECTOMY    . BACK SURGERY    . CESAREAN SECTION    . KNEE ARTHROPLASTY Left    x 2  . POLYPECTOMY    . TONSILLECTOMY     Social History   Social History  . Marital status: Married    Spouse name: N/A  . Number of children: N/A  . Years of education: N/A   Occupational History  . Not on file.   Social History Main Topics  . Smoking status: Current Every Day Smoker    Packs/day: 0.50    Types: Cigarettes  . Smokeless tobacco: Never Used     Comment: 2 cigarettes/day  . Alcohol use No  . Drug use: No  . Sexual activity: Not on file   Other Topics Concern  . Not on file   Social History Narrative  . No narrative on file   Exam:  150/77 98.2-94-18 General: alert, awake, answering questions appropriately. Drove herself and her daughter to hospital Heart: RRR without murmur Lungs: CTA Abdomen: NABS, soft, non distended. Tender in upper abdomen, particularly in the epigastric area. Uterus nontender. No contractions palpated or picked up on monitor.. [id number]. [id number] Extremities: trace edema, no evidence of DVT Neuro: awake, alert, oriented x3 Psyche: mood appropriate A: Upper abdominal pain, particularly epigastric pain. R/O pancreatitis. R/O peptic ulcer CHTN with blood pressures in the mild range Chronic pain- on Subutex No evidence of obstetrical problem  P:CBC, lipase, amylase, CBC, UA Transferred to ER for further evaluation and treatment of abdominal pain  Procedures: none  Discharge Condition: stable  Disposition: 01-Home or Self Care  Diet:   Discharge Activity:       Total time spent taking care of this patient: 20 minutes  Signed: Farrel Conners 01/09/2017, 9:29 PM

## 2017-01-09 NOTE — ED Triage Notes (Signed)
Pt was seen on OB/GYN floor today for abd pain and baby was cleared - pt now in ER for eval medically of abd pain - pt is [redacted] weeks pregnant - pt goes to prenatal high risk clinic every three weeks - pt takes subutex 4 times a day (pain management for 10 years) - pt c/o mid abd pain and N/V

## 2017-01-09 NOTE — OB Triage Note (Addendum)
Upper / lower abdominal pain. Started 2-3 days ago. Breanna Mathews, Kenyotta Dorfman S

## 2017-08-05 ENCOUNTER — Encounter (HOSPITAL_COMMUNITY): Payer: Self-pay

## 2017-10-04 ENCOUNTER — Other Ambulatory Visit: Payer: Self-pay

## 2017-10-04 ENCOUNTER — Encounter: Payer: Self-pay | Admitting: Emergency Medicine

## 2017-10-04 ENCOUNTER — Emergency Department: Payer: Managed Care, Other (non HMO)

## 2017-10-04 ENCOUNTER — Emergency Department
Admission: EM | Admit: 2017-10-04 | Discharge: 2017-10-05 | Disposition: A | Discharge status: Home / Self Care | Payer: Managed Care, Other (non HMO) | Attending: Emergency Medicine | Admitting: Emergency Medicine

## 2017-10-04 DIAGNOSIS — R002 Palpitations: Secondary | ICD-10-CM | POA: Diagnosis not present

## 2017-10-04 DIAGNOSIS — Z5321 Procedure and treatment not carried out due to patient leaving prior to being seen by health care provider: Secondary | ICD-10-CM | POA: Diagnosis not present

## 2017-10-04 LAB — BASIC METABOLIC PANEL
ANION GAP: 7 (ref 5–15)
BUN: 8 mg/dL (ref 6–20)
CO2: 23 mmol/L (ref 22–32)
CREATININE: 0.83 mg/dL (ref 0.44–1.00)
Calcium: 9.1 mg/dL (ref 8.9–10.3)
Chloride: 108 mmol/L (ref 101–111)
GFR calc non Af Amer: >60 mL/min (ref >60)
Glucose, Bld: 119 mg/dL — ABNORMAL HIGH (ref 65–99)
POTASSIUM: 3.4 mmol/L — AB (ref 3.5–5.1)
SODIUM: 138 mmol/L (ref 135–145)

## 2017-10-04 LAB — CBC
HCT: 44.3 % (ref 35.0–47.0)
HEMOGLOBIN: 14.6 g/dL (ref 12.0–16.0)
MCH: 28.3 pg (ref 26.0–34.0)
MCHC: 32.9 g/dL (ref 32.0–36.0)
MCV: 86 fL (ref 80.0–100.0)
PLATELETS: 387 10*3/uL (ref 150–440)
RBC: 5.15 MIL/uL (ref 3.80–5.20)
RDW: 14.1 % (ref 11.5–14.5)
WBC: 11.1 10*3/uL — AB (ref 3.6–11.0)

## 2017-10-04 LAB — TROPONIN I: Troponin I: <0.03 ng/mL (ref <0.03)

## 2017-10-04 NOTE — ED Triage Notes (Signed)
Pt arrived to the ED for complaints of having palpitations and a pounding headache. Pt reports that she went to her doctor today and that they put her on clonidine for hypertension and she began to experience the symptoms after taking it. Pt is AOx4 in no apparent distress, nervous during triage.

## 2017-10-05 ENCOUNTER — Telehealth: Payer: Self-pay | Admitting: Emergency Medicine

## 2017-10-05 NOTE — Telephone Encounter (Signed)
Called patient due to lwot to inquire about condition and follow up plans.  Says she will follow up with her doctor or cardiologist today.  I told her that the test results were available for them to view in epic.

## 2017-10-22 IMAGING — CR DG CHEST 2V
2 series · 2 of 2 positions shown · non-contrast
Comparison: 07/14/2016.

CLINICAL DATA: Central chest pain radiating to the neck.

EXAM:
CHEST  2 VIEW

[chest pa]
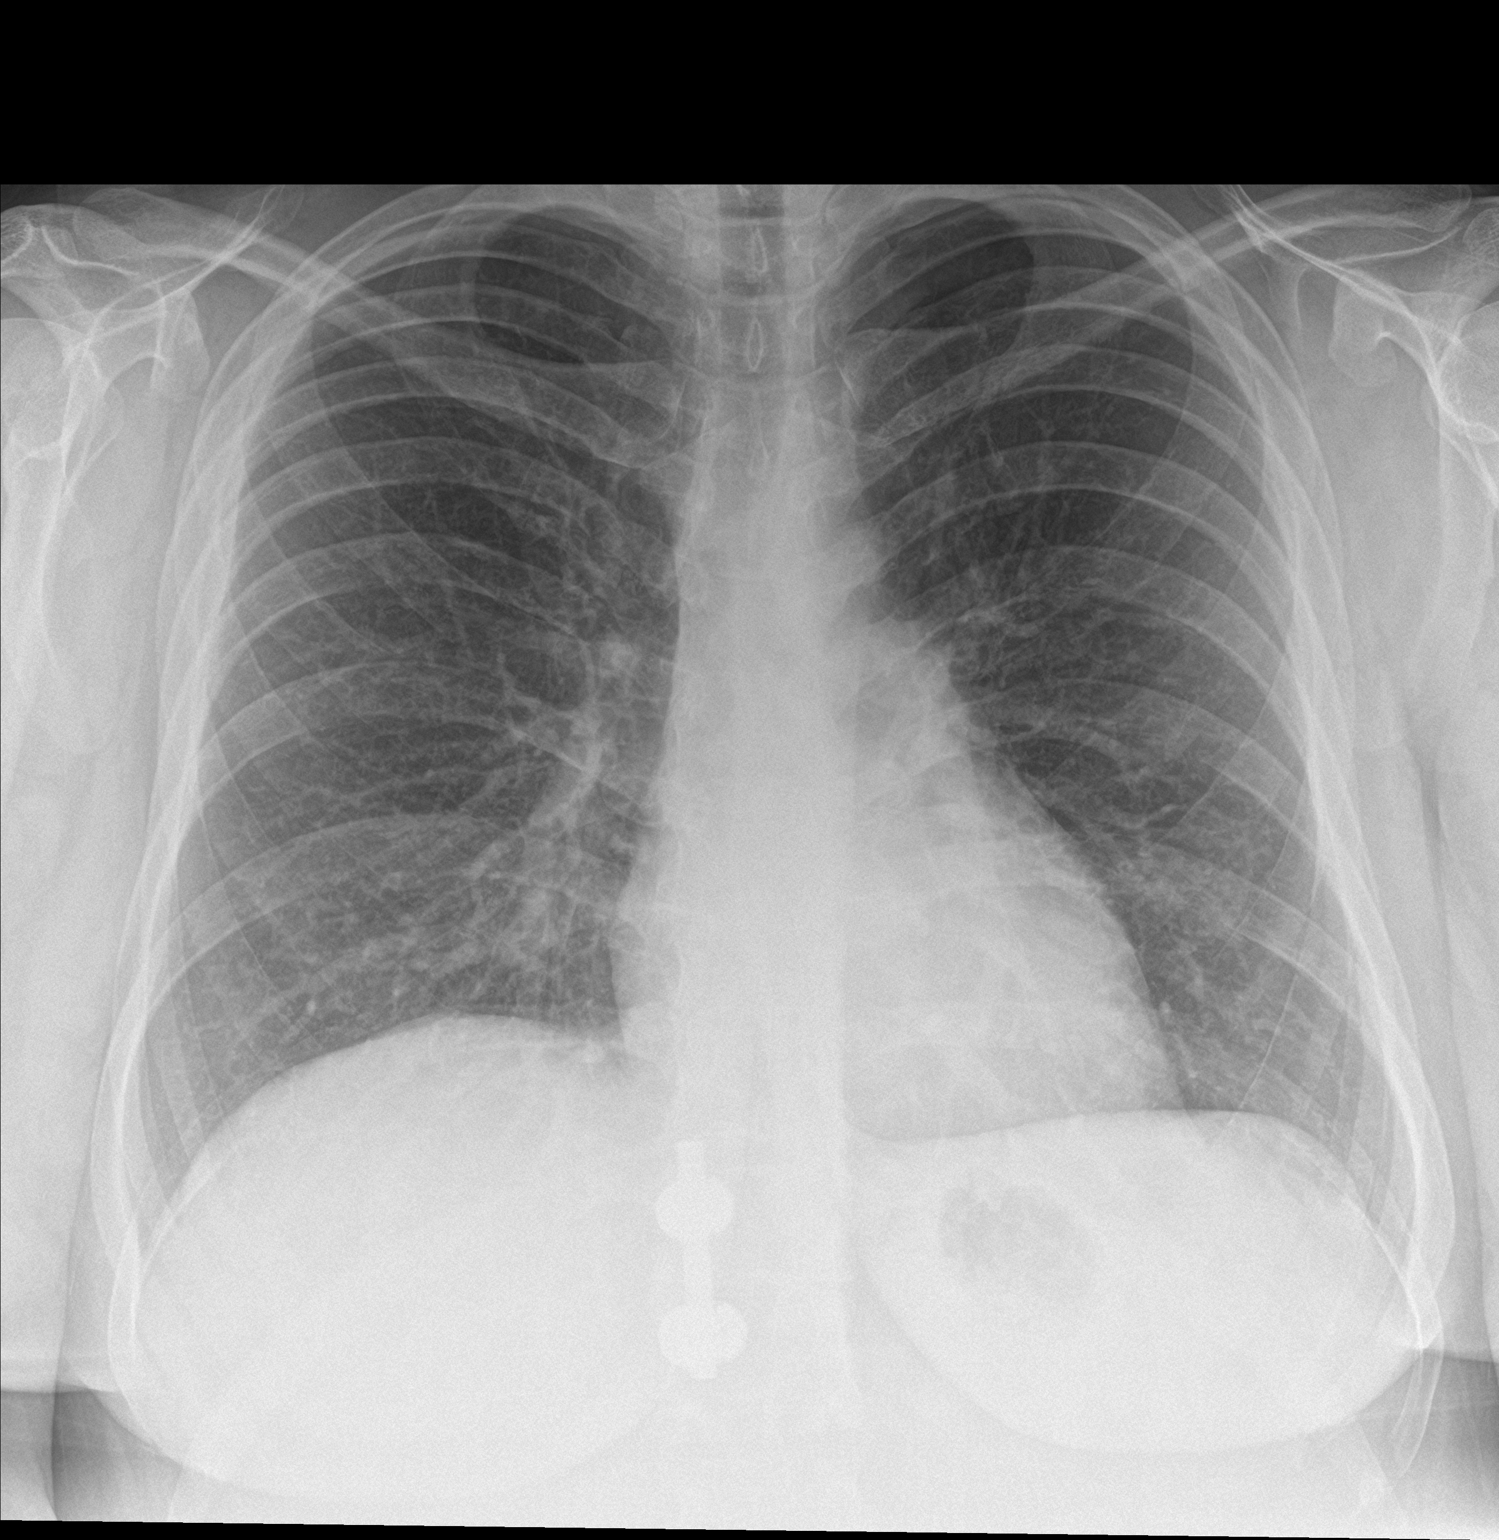

[chest lat]
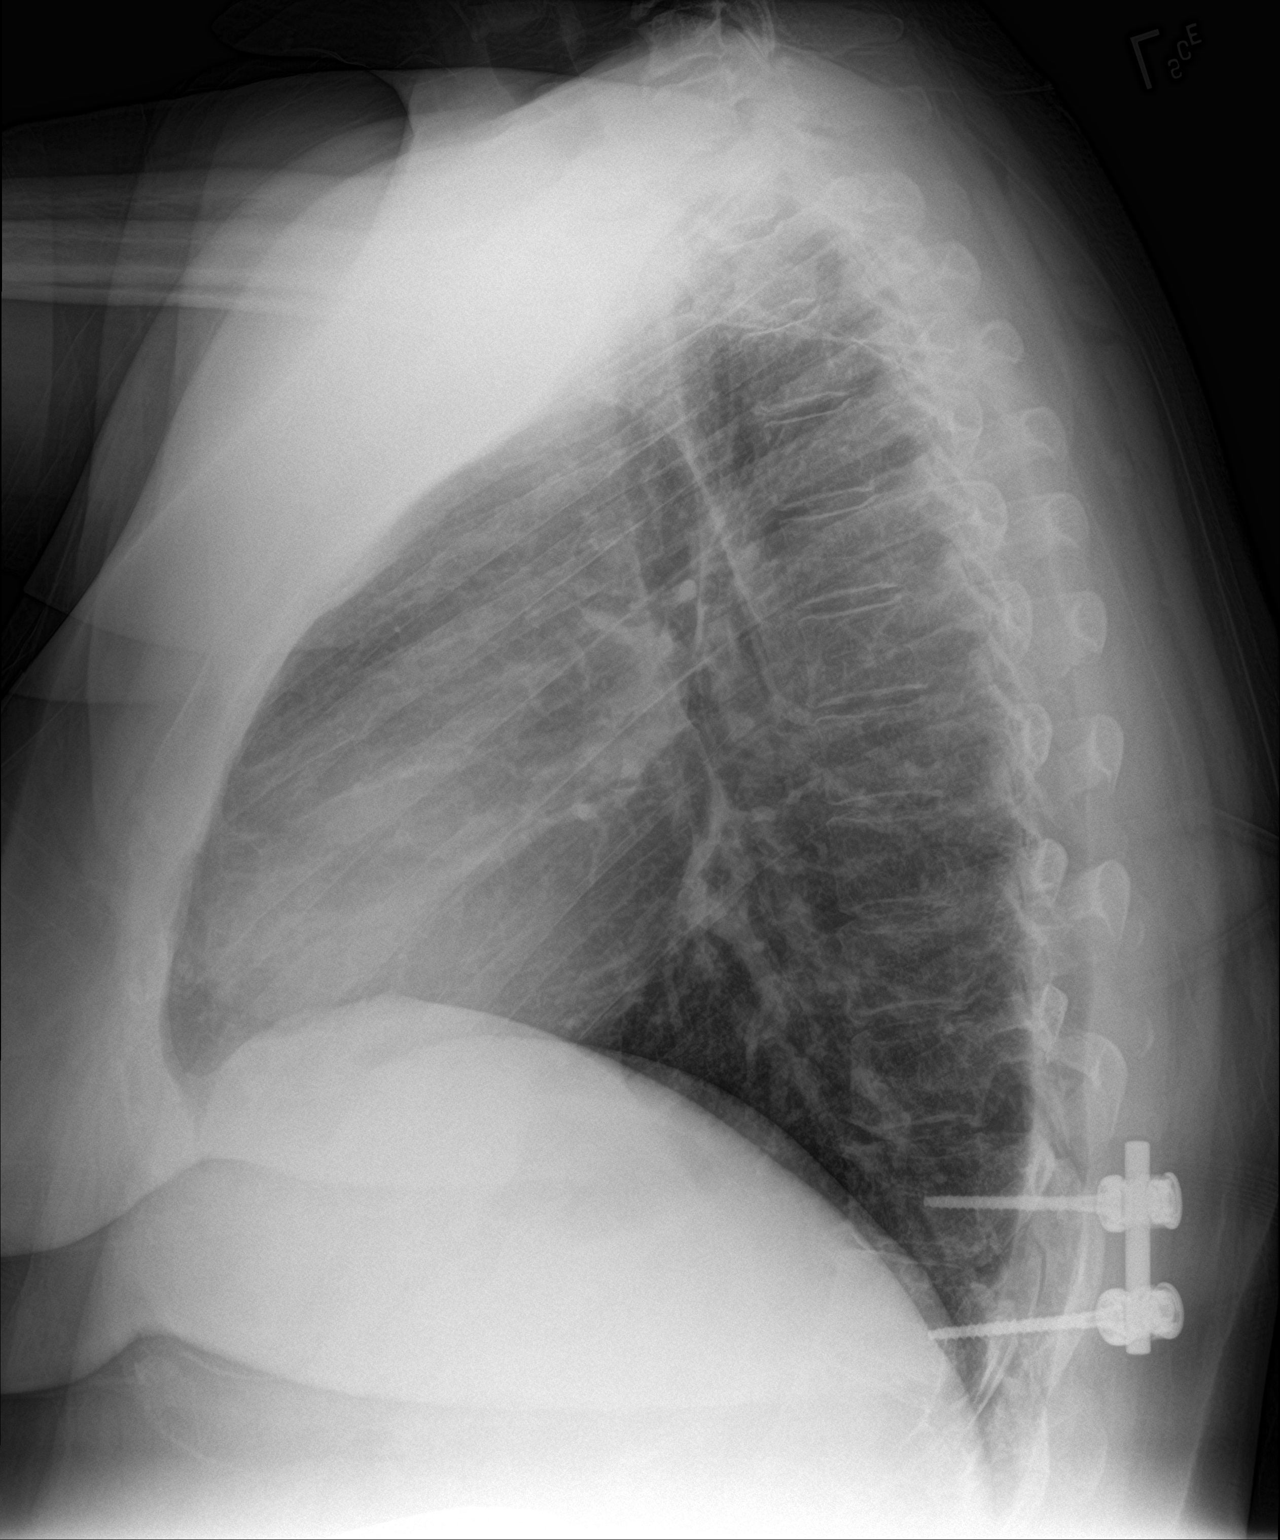

[2 of 2 positions shown; findings below may reference images not displayed]

FINDINGS: Normal sized heart. Clear lungs. Normal vascularity. No pleural
fluid or pneumothorax. Stable screw and rod fixation at the T11-12
level.
IMPRESSION: No acute abnormality.

## 2018-04-29 ENCOUNTER — Ambulatory Visit: Payer: Managed Care, Other (non HMO) | Admitting: Family Medicine

## 2018-12-12 ENCOUNTER — Telehealth: Payer: Self-pay

## 2018-12-12 ENCOUNTER — Other Ambulatory Visit: Payer: Managed Care, Other (non HMO)

## 2018-12-12 DIAGNOSIS — Z20822 Contact with and (suspected) exposure to covid-19: Secondary | ICD-10-CM

## 2018-12-12 NOTE — Addendum Note (Signed)
Addended by: Linus Orn A on: 12/12/2018 02:14 PM   Modules accepted: Orders

## 2018-12-12 NOTE — Telephone Encounter (Signed)
[place] Dept. Request COVID 19 test. Phone no longer in service.

## 2018-12-13 LAB — NOVEL CORONAVIRUS, NAA: SARS-CoV-2, NAA: NOT DETECTED

## 2019-05-25 IMAGING — US US ABDOMEN LIMITED
1 series · 14 of 25 positions shown · non-contrast
Comparison: None.

CLINICAL DATA: Upper abdominal pain for 3 days. Pregnant patient in
second trimester pregnancy.

EXAM:
ULTRASOUND ABDOMEN LIMITED RIGHT UPPER QUADRANT

[Series 1: us abdomen limited · 0.28mm/px · 14 of 38 slices shown]
[im 1/38]
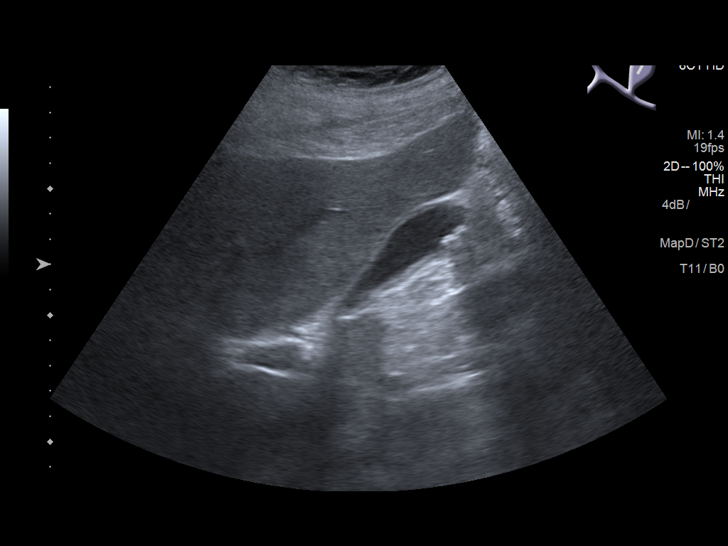
[im 4/38]
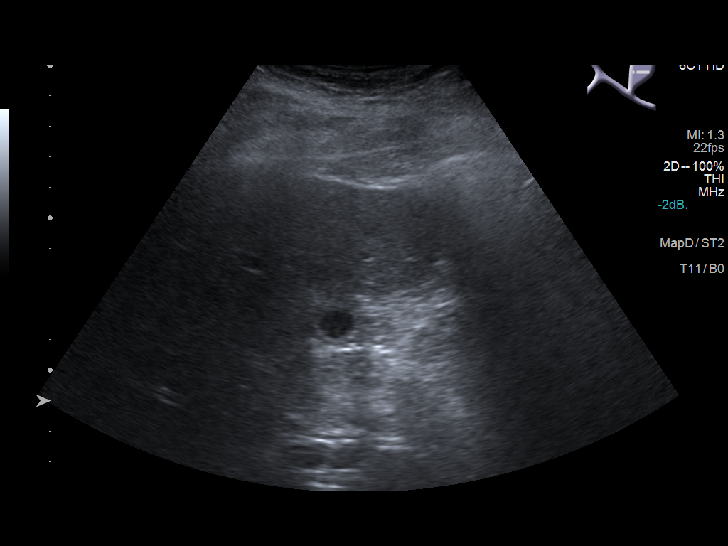
[im 7/38]
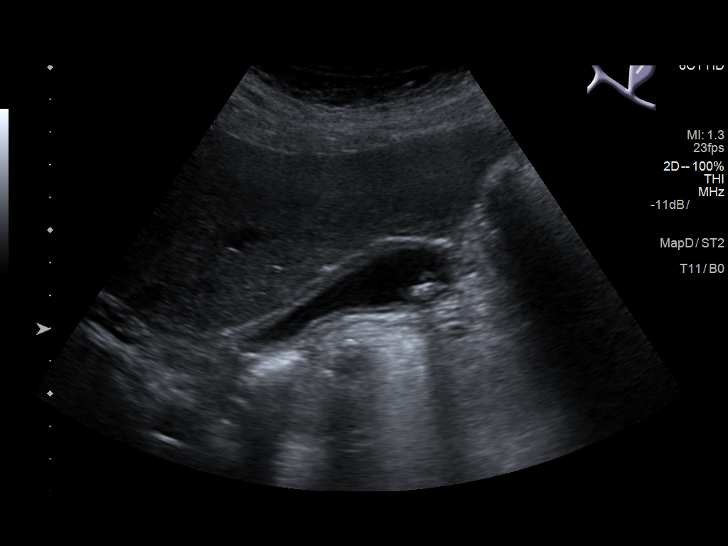
[im 10/38]
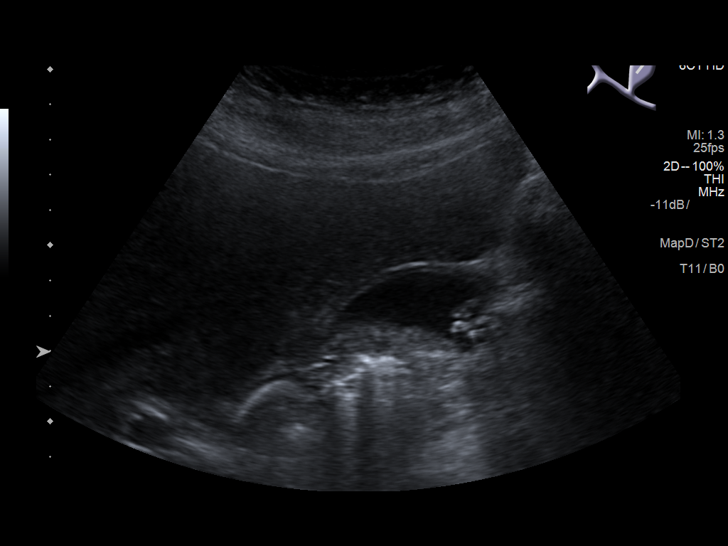
[im 13/38]
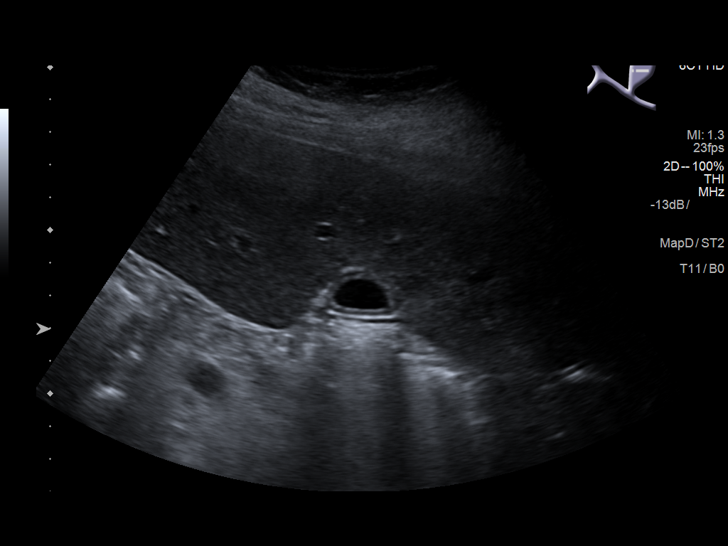
[im 14/38]
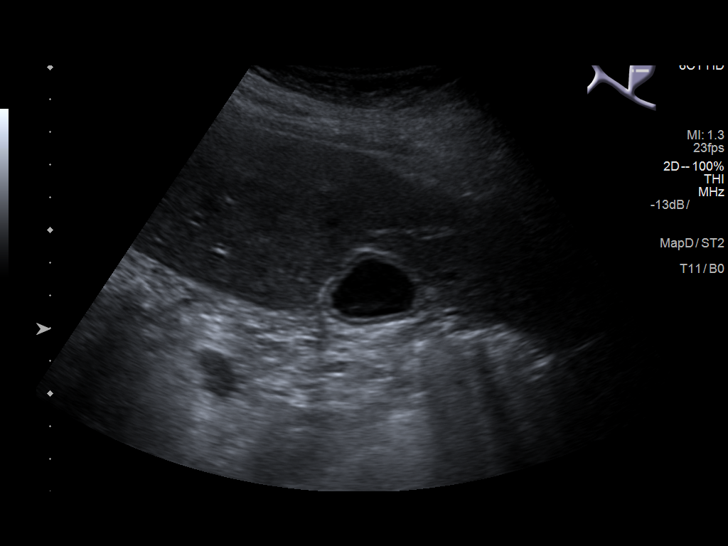
[im 17/38]
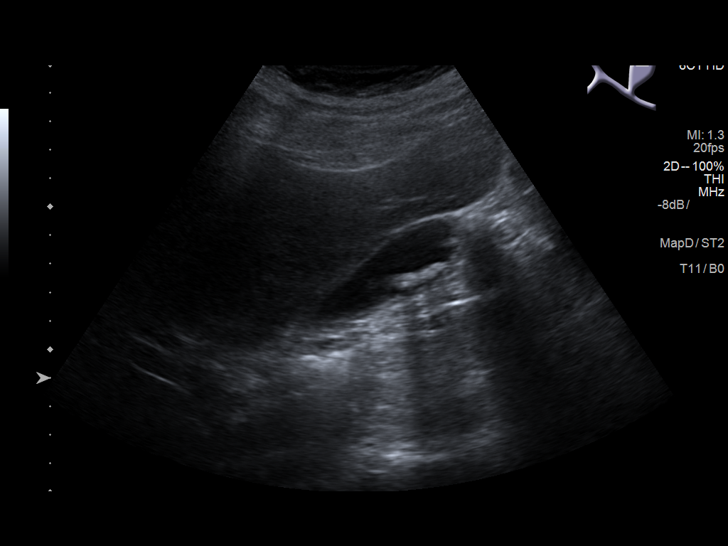
[im 21/38]
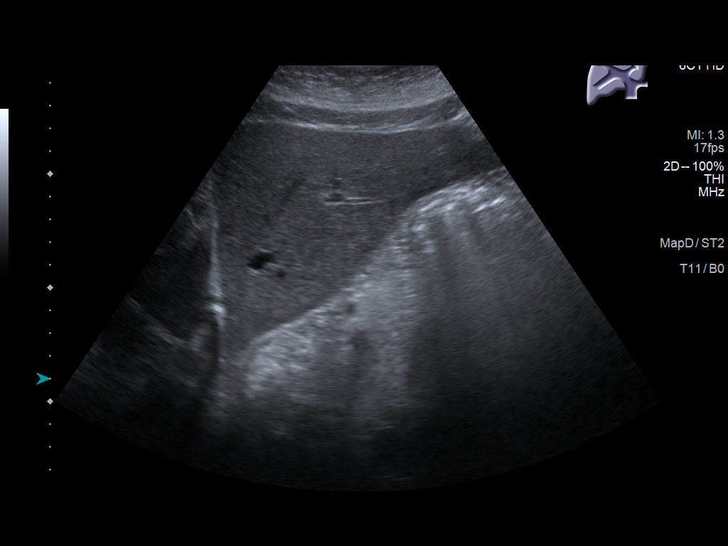
[im 24/38]
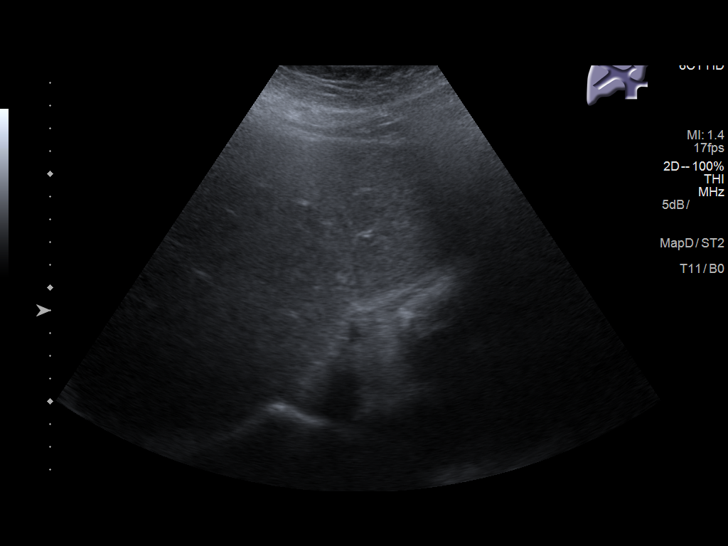
[im 25/38]
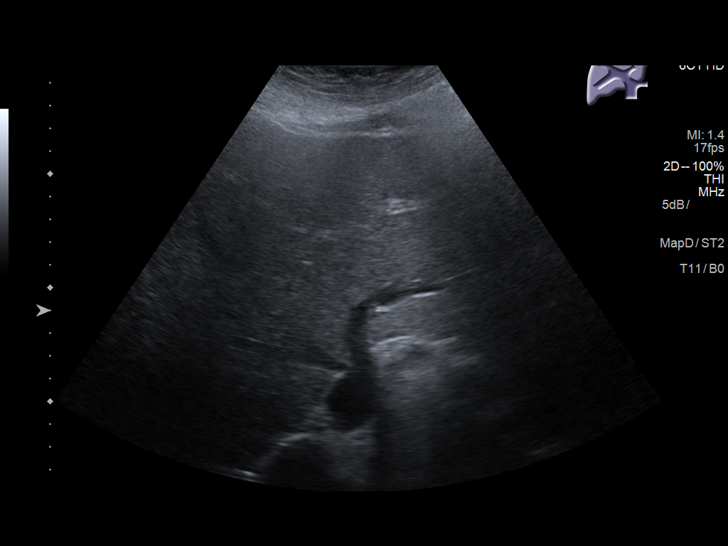
[im 28/38]
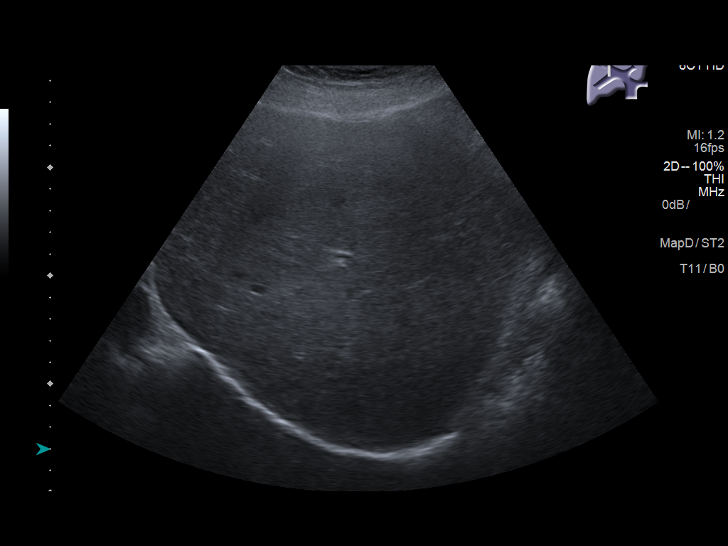
[im 31/38]
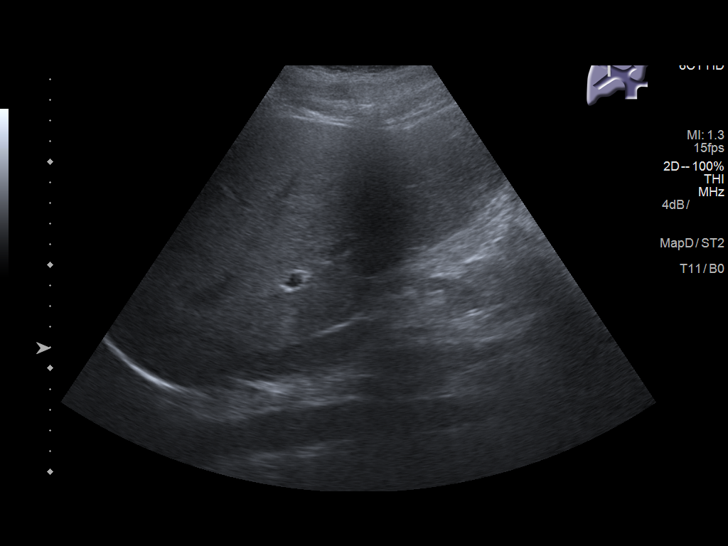
[im 34/38]
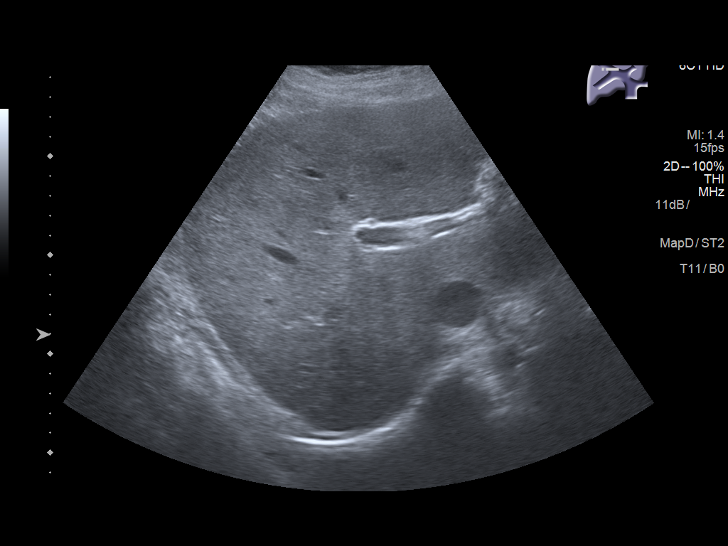
[im 38/38]
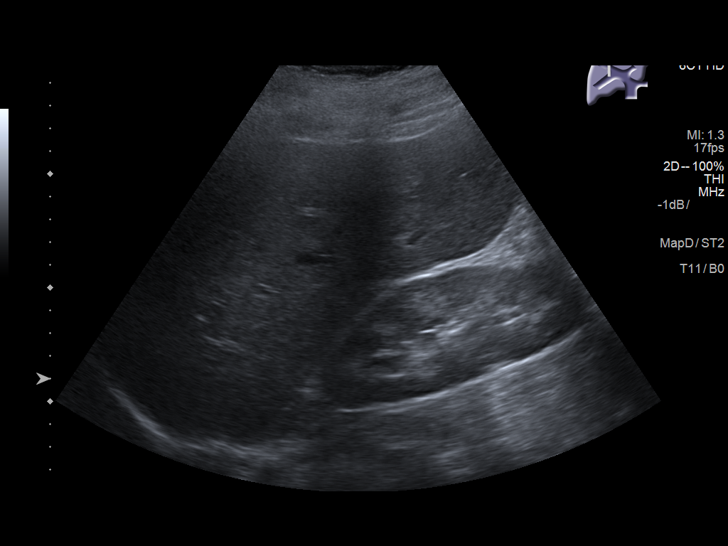

[14 of 25 positions shown; findings below may reference images not displayed]

FINDINGS: Gallbladder:

Partially distended. Multiple shadowing gallstones. Mild gallbladder
wall thickening of 3.5 mm, likely secondary to incomplete
distention. No pericholecystic fluid. No sonographic Murphy sign
noted by sonographer.

Common bile duct:

Diameter: 4 mm, normal.

Liver:

No focal lesion identified. Within normal limits in parenchymal
echogenicity. Normal directional flow in the main portal vein.
IMPRESSION: 1. Partially distended gallbladder with cholelithiasis. Mild
gallbladder wall thickening felt to be secondary to incomplete
distention. No sonographic Murphy sign or pericholecystic fluid to
suggest acute cholecystitis.
2. No biliary dilatation. Normal sonographic appearance of the
liver.

## 2019-11-16 ENCOUNTER — Other Ambulatory Visit: Payer: Self-pay

## 2019-11-16 ENCOUNTER — Emergency Department
Admission: EM | Admit: 2019-11-16 | Discharge: 2019-11-16 | Disposition: A | Discharge status: Home / Self Care | Payer: 59 | Attending: Emergency Medicine | Admitting: Emergency Medicine

## 2019-11-16 ENCOUNTER — Emergency Department: Payer: 59

## 2019-11-16 DIAGNOSIS — R079 Chest pain, unspecified: Secondary | ICD-10-CM | POA: Diagnosis present

## 2019-11-16 DIAGNOSIS — I10 Essential (primary) hypertension: Secondary | ICD-10-CM | POA: Insufficient documentation

## 2019-11-16 DIAGNOSIS — Z7982 Long term (current) use of aspirin: Secondary | ICD-10-CM | POA: Insufficient documentation

## 2019-11-16 DIAGNOSIS — Z79899 Other long term (current) drug therapy: Secondary | ICD-10-CM | POA: Insufficient documentation

## 2019-11-16 DIAGNOSIS — F17210 Nicotine dependence, cigarettes, uncomplicated: Secondary | ICD-10-CM | POA: Diagnosis not present

## 2019-11-16 DIAGNOSIS — R0789 Other chest pain: Secondary | ICD-10-CM | POA: Insufficient documentation

## 2019-11-16 DIAGNOSIS — Z96652 Presence of left artificial knee joint: Secondary | ICD-10-CM | POA: Insufficient documentation

## 2019-11-16 LAB — CBC
HCT: 42.8 % (ref 36.0–46.0)
Hemoglobin: 15.3 g/dL — ABNORMAL HIGH (ref 12.0–15.0)
MCH: 29.9 pg (ref 26.0–34.0)
MCHC: 35.7 g/dL (ref 30.0–36.0)
MCV: 83.8 fL (ref 80.0–100.0)
Platelets: 313 10*3/uL (ref 150–400)
RBC: 5.11 MIL/uL (ref 3.87–5.11)
RDW: 12.7 % (ref 11.5–15.5)
WBC: 9 10*3/uL (ref 4.0–10.5)
nRBC: 0 % (ref 0.0–0.2)

## 2019-11-16 LAB — BASIC METABOLIC PANEL
Anion gap: 13 (ref 5–15)
BUN: 6 mg/dL (ref 6–20)
CO2: 22 mmol/L (ref 22–32)
Calcium: 9.4 mg/dL (ref 8.9–10.3)
Chloride: 99 mmol/L (ref 98–111)
Creatinine, Ser: 0.62 mg/dL (ref 0.44–1.00)
GFR calc Af Amer: >60 mL/min (ref >60)
GFR calc non Af Amer: >60 mL/min (ref >60)
Glucose, Bld: 145 mg/dL — ABNORMAL HIGH (ref 70–99)
Potassium: 3.3 mmol/L — ABNORMAL LOW (ref 3.5–5.1)
Sodium: 134 mmol/L — ABNORMAL LOW (ref 135–145)

## 2019-11-16 LAB — TROPONIN I (HIGH SENSITIVITY)
Troponin I (High Sensitivity): 5 ng/L (ref <18)
Troponin I (High Sensitivity): 5 ng/L (ref <18)

## 2019-11-16 LAB — FIBRIN DERIVATIVES D-DIMER (ARMC ONLY): Fibrin derivatives D-dimer (ARMC): 293.4 ng/mL (FEU) (ref 0.00–499.00)

## 2019-11-16 MED ORDER — KETOROLAC TROMETHAMINE 30 MG/ML IJ SOLN
30.0000 mg | Freq: Once | INTRAMUSCULAR | Status: AC
  Administered 2019-11-16: 30 mg via INTRAVENOUS
  Filled 2019-11-16: qty 1

## 2019-11-16 MED ORDER — NITROGLYCERIN 0.4 MG SL SUBL
0.4000 mg | SUBLINGUAL_TABLET | SUBLINGUAL | Status: DC | PRN
  Administered 2019-11-16: 0.4 mg via SUBLINGUAL
  Filled 2019-11-16: qty 1

## 2019-11-16 NOTE — Discharge Instructions (Addendum)
You may take your chronic pain medications as well as ibuprofen 600 mg up to every 6 hours as needed for the pain.  Continue to take your blood pressure medications as prescribed.  Return to the ER for new, worsening, or persistent severe chest pain, difficulty breathing, weakness or lightheadedness, severely elevated blood pressure, or any other new or worsening symptoms that concern you.

## 2019-11-16 NOTE — ED Notes (Signed)
Pt denies decrease in CP after administration of Nitro. Pt reports she feels "more lethargic" Dr Marisa Severin notified, reports not to give anymore Nitro BP 130s systolic

## 2019-11-16 NOTE — ED Provider Notes (Signed)
[facility] Emergency Department Provider Note ____________________________________________   First MD Initiated Contact with Patient 11/16/19 1951     (approximate)  I have reviewed the triage vital signs and the nursing notes.   HISTORY  Chief Complaint Chest Pain    HPI Breanna Mathews is a 34 y.o. female with PMH as noted below including chronic pain for which she is on Suboxone who presents with chest pain, acute onset around midday today, persistent since then, and described as squeezing.  She has also had some flashes or dots in her vision and states that she feels very lightheaded.  She denies any shortness of breath, but states that the pain is worse when she takes a deep breath.  She has no fever chills, cough, nausea or vomiting, or abdominal pain.  She states that she has had similar pain before, but not this severe.  She also reports a prior history of an arrhythmia requiring cardioversion, and states that she has a history of angina, but has never had a cardiac catheterization and has no known history of CAD.  Past Medical History:  Diagnosis Date  . Angina of effort (HCC)   . Anxiety   . Bipolar disorder with depression (HCC)   . Chronic back pain   . GERD (gastroesophageal reflux disease)   . Hypertension   . Mitral valve prolapse     Patient Active Problem List   Diagnosis Date Noted  . Abdominal pain in pregnancy 01/09/2017  . Drug-seeking behavior 07/30/2016  . Opioid use agreement exists 07/30/2016  . Acute low back pain 06/19/2016  . Chronic jaw pain 01/16/2016  . Oropharyngeal lesion 01/16/2016  . Mid sternal chest pain 10/04/2015  . Bipolar disorder with depression (HCC) 09/03/2015  . 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 Spell of altered consciousness 05/15/2015  . Dependence on nicotine from cigarettes 04/10/2015  . History of hypertension 04/10/2015  . Intentional fentanyl overdose 04/10/2015  . Sinus tachycardia  04/10/2015  . Suicidal ideation 04/10/2015  . Chronic pain disorder 04/09/2015  . Dissociative episodes 04/09/2015  . Mood disorder (HCC) 04/09/2015  . Opioid use disorder, severe, dependence (HCC) 04/09/2015  . Chronic bilateral low back pain with bilateral sciatica 03/08/2015  . Borderline personality disorder (HCC) 01/16/2015  . 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 Uncomplicated opioid dependence (HCC) 06/22/2014  . Essential (primary) hypertension 03/17/2013  . Migraine with aura and without status migrainosus, not intractable 03/17/2013  . Other specified anxiety disorders 03/17/2013  . Spondylosis of cervical region without myelopathy or radiculopathy 07/11/2012  . Spondylosis of thoracic region without myelopathy or radiculopathy 07/11/2012  . Cannabis abuse, uncomplicated 11/27/2011    Past Surgical History:  Procedure Laterality Date  . APPENDECTOMY    . BACK SURGERY    . CESAREAN SECTION    . KNEE ARTHROPLASTY Left    x 2  . POLYPECTOMY    . TONSILLECTOMY      Prior to Admission medications   Medication Sig Start Date End Date Taking? Authorizing Provider  acetaminophen (TYLENOL) 500 MG tablet Take 1,000 mg by mouth 3 (three) times daily.    [provider]  aspirin 81 MG chewable tablet Chew 81 mg by mouth 2 (two) times daily.    [provider]  buprenorphine (SUBUTEX) 8 MG SUBL SL tablet Place 4 mg under the tongue 3 (three) times daily. 11/02/16   [provider]  diphenhydrAMINE (BENADRYL) 50 MG capsule  Take 50 mg by mouth 3 (three) times daily.    [provider]  labetalol (NORMODYNE) 100 MG tablet Take 2 tablets (200 mg total) by mouth 2 (two) times daily. 12/20/16   Willy Eddy, MD  lamoTRIgine (LAMICTAL) 25 MG tablet Take 25 mg by mouth 2 (two) times daily.    [provider]  pyridOXINE (VITAMIN B-6) 50 MG tablet Take 1 tablet (50 mg total) by mouth 3 (three) times  daily. 11/17/16   Ward, Elenora Fender, MD  sertraline (ZOLOFT) 50 MG tablet Take 150 mg by mouth daily. 09/07/16   [provider]    Allergies Oxymorphone, Keflex [cephalexin], Lithium, and Opana [oxymorphone hcl]  No family history on file.  Social History Social History   Tobacco Use  . Smoking status: Current Every Day Smoker    Packs/day: 0.50    Types: Cigarettes  . Smokeless tobacco: Never Used  . Tobacco comment: 2 cigarettes/day  Substance Use Topics  . Alcohol use: No  . Drug use: No    Review of Systems  Constitutional: No fever/chills. Eyes: No redness. ENT: No sore throat. Cardiovascular: Positive for chest pain. Respiratory: Denies shortness of breath. Gastrointestinal: No vomiting or diarrhea.  Genitourinary: Negative for flank pain.  Musculoskeletal: Negative for acute back pain. Skin: Negative for rash. Neurological: Negative for headache.   ____________________________________________   PHYSICAL EXAM:  VITAL SIGNS: ED Triage Vitals  Enc Vitals Group     BP 11/16/19 1932 (!) 182/98     Pulse Rate 11/16/19 1931 75     Resp 11/16/19 1931 (!) 22     Temp 11/16/19 1931 98.7 F (37.1 C)     Temp Source 11/16/19 1931 Oral     SpO2 11/16/19 1931 96 %     Weight 11/16/19 1932 248 lb (112.5 kg)     Height 11/16/19 1932 5\' 10"  (1.778 m)     Head Circumference --      Peak Flow --      Pain Score 11/16/19 1931 7     Pain Loc --      Pain Edu? --      Excl. in GC? --     Constitutional: Alert and oriented.  Uncomfortable appearing but in no acute distress. Eyes: Conjunctivae are normal.  EOMI. Head: Atraumatic. Nose: No congestion/rhinnorhea. Mouth/Throat: Mucous membranes are moist.   Neck: Normal range of motion.  Cardiovascular: Normal rate, regular rhythm. Grossly normal heart sounds.  Good peripheral circulation. Respiratory: Normal respiratory effort.  No retractions. Lungs CTAB. Gastrointestinal: No distention.  Musculoskeletal: No  lower extremity edema.  No calf or popliteal swelling or tenderness.  Extremities warm and well perfused.  Neurologic:  Normal speech and language. No gross focal neurologic deficits are appreciated.  Skin:  Skin is warm and dry. No rash noted. Psychiatric: Mood and affect are normal. Speech and behavior are normal.  ____________________________________________   LABS (all labs ordered are listed, but only abnormal results are displayed)  Labs Reviewed  BASIC METABOLIC PANEL - Abnormal; Notable for the following components:      Result Value   Sodium 134 (*)    Potassium 3.3 (*)    Glucose, Bld 145 (*)    All other components within normal limits  CBC - Abnormal; Notable for the following components:   Hemoglobin 15.3 (*)    All other components within normal limits  FIBRIN DERIVATIVES D-DIMER ([facility] ONLY)  POC URINE PREG, ED  TROPONIN I (HIGH SENSITIVITY)  TROPONIN  I (HIGH SENSITIVITY)   ____________________________________________  EKG  ED ECG REPORT I, Arta Silence, the attending physician, personally viewed and interpreted this ECG.  Date: 11/16/2019 EKG Time: 1938 Rate: 81 Rhythm: normal sinus rhythm QRS Axis: normal Intervals: normal ST/T Wave abnormalities: Nonspecific ST abnormality Narrative Interpretation: Nonspecific ST abnormality with no significant change when compared to EKG of 10/04/2017; no evidence of acute ischemia  ____________________________________________  RADIOLOGY  CXR: No focal infiltrate or other acute abnormality  ____________________________________________   PROCEDURES  Procedure(s) performed: No  Procedures  Critical Care performed: No ____________________________________________   INITIAL IMPRESSION / ASSESSMENT AND PLAN / ED COURSE  Pertinent labs & imaging results that were available during my care of the patient were reviewed by me and considered in my medical decision making (see chart for details).  34 year old  female with PMH as noted above including chronic pain presents with atypical and nonexertional chest pain since around midday, associated with some lightheadedness and visual scotomas.  She reports that the pain is pleuritic but is not short of breath.  I reviewed the past medical records in [place].  The patient has not been in the ED for several years.  She has has not had a cardiac catheterization I do not see any recent echocardiograms.  She has notes from [facility] where she has seen chronically and is on Suboxone.  On exam she is somewhat uncomfortable but overall well-appearing.  The vital signs are normal except for hypertension in triage.  The blood pressure is now normalized without intervention.  Physical exam is otherwise unremarkable.  Her EKG is nonischemic.  Overall I have a low suspicion for cardiac etiology, however given the questionable history of angina and prior heart problems, we will obtain troponins x2.  Overall presentation is most consistent with musculoskeletal pain, GERD, nerve pain, or other benign etiology.  She is PERC negative but due to the relative intensity and pleuritic nature of the pain and the duration of the symptoms, I will obtain a D-dimer.  I will give nitroglycerin and reassess the pain.  ----------------------------------------- 9:50 PM on 11/16/2019 -----------------------------------------  Initial and repeat troponins were both negative, as was the D-dimer.  The patient had no improvement in the pain with nitroglycerin, and stated that it just made her feel sleepy.  This also goes against a cardiac etiology of the pain.  I explained that I would like to avoid opiates given that she is on Suboxone, is chronically on opiates, and likely has a high tolerance.  I gave a dose of Toradol, and she reported a marked improvement in her pain.  Overall, presentation is still consistent with musculoskeletal pain or other benign etiology.  The patient feels  much better and wants to go home.  I counseled her on the results of the work-up.  I recommended that she follow-up with her PMD.  I gave her thorough return precautions, and she expressed understanding.  ____________________________________________   FINAL CLINICAL IMPRESSION(S) / ED DIAGNOSES  Final diagnoses:  Atypical chest pain      NEW MEDICATIONS STARTED DURING THIS VISIT:  Discharge Medication List as of 11/16/2019 10:01 PM       Note:  This document was prepared using Dragon voice recognition software and may include unintentional dictation errors.   Arta Silence, MD 11/17/19 [phone]

## 2019-11-16 NOTE — ED Triage Notes (Signed)
Pt to ED via [facility] from home for chief complaint of centralized CP that started at noon. Denies N/v. Reports SHOB that started at noon as well.  Hx of seizure and angina.  EMS reports hypertensive with systolic BP in 180-200.  Alert and oriented.  Reports vision impairment on right  side described as " dots with my heart beat"

## 2019-11-16 NOTE — ED Notes (Signed)
Reports h/a along with cp at this time. Continues to report "dots" in right sided vision

## 2020-07-02 ENCOUNTER — Encounter: Payer: Self-pay | Admitting: Emergency Medicine

## 2020-07-02 ENCOUNTER — Other Ambulatory Visit: Payer: Self-pay

## 2020-07-02 ENCOUNTER — Emergency Department
Admission: EM | Admit: 2020-07-02 | Discharge: 2020-07-02 | Disposition: A | Discharge status: Home / Self Care | Payer: 59 | Attending: Emergency Medicine | Admitting: Emergency Medicine

## 2020-07-02 DIAGNOSIS — R519 Headache, unspecified: Secondary | ICD-10-CM | POA: Insufficient documentation

## 2020-07-02 DIAGNOSIS — R569 Unspecified convulsions: Secondary | ICD-10-CM | POA: Diagnosis not present

## 2020-07-02 DIAGNOSIS — I10 Essential (primary) hypertension: Secondary | ICD-10-CM | POA: Insufficient documentation

## 2020-07-02 DIAGNOSIS — R079 Chest pain, unspecified: Secondary | ICD-10-CM | POA: Insufficient documentation

## 2020-07-02 DIAGNOSIS — Z5321 Procedure and treatment not carried out due to patient leaving prior to being seen by health care provider: Secondary | ICD-10-CM | POA: Diagnosis not present

## 2020-07-02 DIAGNOSIS — R42 Dizziness and giddiness: Secondary | ICD-10-CM | POA: Diagnosis present

## 2020-07-02 LAB — BASIC METABOLIC PANEL
Anion gap: 12 (ref 5–15)
BUN: 8 mg/dL (ref 6–20)
CO2: 25 mmol/L (ref 22–32)
Calcium: 9.1 mg/dL (ref 8.9–10.3)
Chloride: 98 mmol/L (ref 98–111)
Creatinine, Ser: 0.71 mg/dL (ref 0.44–1.00)
GFR, Estimated: >60 mL/min (ref >60)
Glucose, Bld: 174 mg/dL — ABNORMAL HIGH (ref 70–99)
Potassium: 3.8 mmol/L (ref 3.5–5.1)
Sodium: 135 mmol/L (ref 135–145)

## 2020-07-02 LAB — CBC
HCT: 45.9 % (ref 36.0–46.0)
Hemoglobin: 15.7 g/dL — ABNORMAL HIGH (ref 12.0–15.0)
MCH: 30.7 pg (ref 26.0–34.0)
MCHC: 34.2 g/dL (ref 30.0–36.0)
MCV: 89.8 fL (ref 80.0–100.0)
Platelets: 261 10*3/uL (ref 150–400)
RBC: 5.11 MIL/uL (ref 3.87–5.11)
RDW: 12.9 % (ref 11.5–15.5)
WBC: 12.1 10*3/uL — ABNORMAL HIGH (ref 4.0–10.5)
nRBC: 0 % (ref 0.0–0.2)

## 2020-07-02 LAB — TROPONIN I (HIGH SENSITIVITY): Troponin I (High Sensitivity): 3 ng/L (ref <18)

## 2020-07-02 NOTE — ED Triage Notes (Signed)
Pt to ED reporting dizziness, headache and chest pain for the past two days with HTN. Pt reporting NV for the past two days as well. No cough or fevers.   Pt also has a hx of seizures and reports she has been able to take her medications but has had multiple seizures since onset of symptoms.

## 2020-07-07 ENCOUNTER — Emergency Department
Admission: EM | Admit: 2020-07-07 | Discharge: 2020-07-07 | Disposition: A | Discharge status: Home / Self Care | Payer: 59 | Attending: Emergency Medicine | Admitting: Emergency Medicine

## 2020-07-07 ENCOUNTER — Other Ambulatory Visit: Payer: Self-pay

## 2020-07-07 DIAGNOSIS — M546 Pain in thoracic spine: Secondary | ICD-10-CM | POA: Diagnosis present

## 2020-07-07 DIAGNOSIS — Z8739 Personal history of other diseases of the musculoskeletal system and connective tissue: Secondary | ICD-10-CM | POA: Diagnosis not present

## 2020-07-07 DIAGNOSIS — G8929 Other chronic pain: Secondary | ICD-10-CM | POA: Insufficient documentation

## 2020-07-07 DIAGNOSIS — Z96652 Presence of left artificial knee joint: Secondary | ICD-10-CM | POA: Insufficient documentation

## 2020-07-07 DIAGNOSIS — F17210 Nicotine dependence, cigarettes, uncomplicated: Secondary | ICD-10-CM | POA: Insufficient documentation

## 2020-07-07 DIAGNOSIS — Z79899 Other long term (current) drug therapy: Secondary | ICD-10-CM | POA: Insufficient documentation

## 2020-07-07 DIAGNOSIS — I10 Essential (primary) hypertension: Secondary | ICD-10-CM | POA: Diagnosis not present

## 2020-07-07 DIAGNOSIS — Z7982 Long term (current) use of aspirin: Secondary | ICD-10-CM | POA: Diagnosis not present

## 2020-07-07 MED ORDER — DIAZEPAM 5 MG PO TABS
5.0000 mg | ORAL_TABLET | Freq: Three times a day (TID) | ORAL | 0 refills | Status: DC | PRN
Start: 2020-07-07 — End: 2021-05-08

## 2020-07-07 NOTE — ED Provider Notes (Signed)
[facility] Emergency Department Provider Note   ____________________________________________   Event Date/Time   First MD Initiated Contact with Patient 07/07/20 2346     (approximate)  I have reviewed the triage vital signs and the nursing notes.   HISTORY  Chief Complaint Back Pain    HPI Breanna Mathews is a 35 y.o. female who presents to the ED from home with a chief complaint of chronic back pain.  Patient reports she had thoracic back surgery in 2007.  Reports her old pain management doctor retired, she was unhappy with a new one, and has an appointment scheduled with another pain management physician in [place] in February.  Has been out of her pain medications x1 week.  Reports no acute change or intensity in her pain, no bowel or bladder incontinence, no extremity weakness, numbness or tingling     Past Medical History:  Diagnosis Date  . Angina of effort (HCC)   . Anxiety   . Bipolar disorder with depression (HCC)   . Chronic back pain   . GERD (gastroesophageal reflux disease)   . Hypertension   . Mitral valve prolapse     Patient Active Problem List   Diagnosis Date Noted  . Abdominal pain in pregnancy 01/09/2017  . Drug-seeking behavior 07/30/2016  . Opioid use agreement exists 07/30/2016  . Acute low back pain 06/19/2016  . Chronic jaw pain 01/16/2016  . Oropharyngeal lesion 01/16/2016  . Mid sternal chest pain 10/04/2015  . Bipolar disorder with depression (HCC) 09/03/2015  . 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 Spell of altered consciousness 05/15/2015  . Dependence on nicotine from cigarettes 04/10/2015  . History of hypertension 04/10/2015  . Intentional fentanyl overdose (HCC) 04/10/2015  . Sinus tachycardia 04/10/2015  . Suicidal ideation 04/10/2015  . Chronic pain disorder 04/09/2015  . Dissociative episodes 04/09/2015  . Mood disorder (HCC) 04/09/2015  . Opioid use disorder, severe, dependence (HCC)  04/09/2015  . Chronic bilateral low back pain with bilateral sciatica 03/08/2015  . Borderline personality disorder (HCC) 01/16/2015  . 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 Uncomplicated opioid dependence (HCC) 06/22/2014  . Essential (primary) hypertension 03/17/2013  . Migraine with aura and without status migrainosus, not intractable 03/17/2013  . Other specified anxiety disorders 03/17/2013  . Spondylosis of cervical region without myelopathy or radiculopathy 07/11/2012  . Spondylosis of thoracic region without myelopathy or radiculopathy 07/11/2012  . Cannabis abuse, uncomplicated 11/27/2011    Past Surgical History:  Procedure Laterality Date  . APPENDECTOMY    . BACK SURGERY    . CESAREAN SECTION    . KNEE ARTHROPLASTY Left    x 2  . POLYPECTOMY    . TONSILLECTOMY      Prior to Admission medications   Medication Sig Start Date End Date Taking? Authorizing Provider  diazepam (VALIUM) 5 MG tablet Take 1 tablet (5 mg total) by mouth every 8 (eight) hours as needed for anxiety. 07/07/20  Yes Irean Hong, MD  acetaminophen (TYLENOL) 500 MG tablet Take 1,000 mg by mouth 3 (three) times daily.    [provider]  aspirin 81 MG chewable tablet Chew 81 mg by mouth 2 (two) times daily.    [provider]  buprenorphine (SUBUTEX) 8 MG SUBL SL tablet Place 4 mg under the tongue 3 (three) times daily. 11/02/16   [provider]  diphenhydrAMINE (BENADRYL) 50 MG capsule Take 50 mg by mouth 3 (three) times  daily.    [provider]  labetalol (NORMODYNE) 100 MG tablet Take 2 tablets (200 mg total) by mouth 2 (two) times daily. 12/20/16   Willy Eddy, MD  lamoTRIgine (LAMICTAL) 25 MG tablet Take 25 mg by mouth 2 (two) times daily.    [provider]  pyridOXINE (VITAMIN B-6) 50 MG tablet Take 1 tablet (50 mg total) by mouth 3 (three) times daily. 11/17/16   Ward, Elenora Fender, MD  sertraline (ZOLOFT)  50 MG tablet Take 150 mg by mouth daily. 09/07/16   [provider]    Allergies Oxymorphone, Keflex [cephalexin], Lithium, and Opana [oxymorphone hcl]  No family history on file.  Social History Social History   Tobacco Use  . Smoking status: Current Every Day Smoker    Packs/day: 0.50    Types: Cigarettes  . Smokeless tobacco: Never Used  . Tobacco comment: 2 cigarettes/day  Substance Use Topics  . Alcohol use: No  . Drug use: No    Review of Systems  Constitutional: No fever/chills Eyes: No visual changes. ENT: No sore throat. Cardiovascular: Denies chest pain. Respiratory: Denies shortness of breath. Gastrointestinal: No abdominal pain.  No nausea, no vomiting.  No diarrhea.  No constipation. Genitourinary: Negative for dysuria. Musculoskeletal: Positive for chronic back pain. Skin: Negative for rash. Neurological: Negative for headaches, focal weakness or numbness.   ____________________________________________   PHYSICAL EXAM:  VITAL SIGNS: ED Triage Vitals  Enc Vitals Group     BP 07/07/20 1938 104/79     Pulse Rate 07/07/20 1938 (!) 148     Resp 07/07/20 1938 (!) 22     Temp 07/07/20 1938 98.1 F (36.7 C)     Temp Source 07/07/20 1938 Oral     SpO2 07/07/20 1938 97 %     Weight --      Height --      Head Circumference --      Peak Flow --      Pain Score 07/07/20 1942 10     Pain Loc --      Pain Edu? --      Excl. in GC? --     Constitutional: Alert and oriented. Well appearing and in no acute distress. Eyes: Conjunctivae are normal. PERRL. EOMI. Head: Atraumatic. Nose: No congestion/rhinnorhea. Mouth/Throat: Mucous membranes are moist.  Oropharynx non-erythematous. Neck: No stridor.   Cardiovascular: Normal rate, regular rhythm. Grossly normal heart sounds.  Good peripheral circulation. Respiratory: Normal respiratory effort.  No retractions. Lungs CTAB. Gastrointestinal: Soft and nontender. No distention. No abdominal bruits. No  CVA tenderness. Musculoskeletal: Midline thoracic back pain. Midline thoracic surgical scar. Full ROM with some pain. No lower extremity tenderness nor edema.  No joint effusions. Neurologic:  Normal speech and language. No gross focal neurologic deficits are appreciated. No gait instability. Skin:  Skin is warm, dry and intact. No rash noted. Psychiatric: Mood and affect are normal. Speech and behavior are normal.  ____________________________________________   LABS (all labs ordered are listed, but only abnormal results are displayed)  Labs Reviewed - No data to display ____________________________________________  EKG  None ____________________________________________  RADIOLOGY I, Carlean Crowl J, personally viewed and evaluated these images (plain radiographs) as part of my medical decision making, as well as reviewing the written report by the radiologist.  ED MD interpretation:  None  Official radiology report(s): No results found.  ____________________________________________   PROCEDURES  Procedure(s) performed (including Critical Care):  Procedures   ____________________________________________   INITIAL IMPRESSION / ASSESSMENT AND  PLAN / ED COURSE  As part of my medical decision making, I reviewed the following data within the [place] notes reviewed and incorporated, Old chart reviewed and Notes from prior ED visits     35 year old female presenting with chronic back pain.  No focal neurological deficits on examination.  Discussed with patient [facility] chronic pain policy.  I did offer patient Toradol, Lidocaine patch, Valium and 1 Percocet.  She declines all except for a prescription for Valium.  Strict return precautions given.  Patient verbalizes understanding and agrees with plan of care.      ____________________________________________   FINAL CLINICAL IMPRESSION(S) / ED DIAGNOSES  Final diagnoses:  Chronic midline  thoracic back pain     ED Discharge Orders         Ordered    diazepam (VALIUM) 5 MG tablet  Every 8 hours PRN        07/07/20 2347          *Please note:  Breanna Mathews was evaluated in Emergency Department on 07/08/2020 for the symptoms described in the history of present illness. She was evaluated in the context of the global COVID-19 pandemic, which necessitated consideration that the patient might be at risk for infection with the SARS-CoV-2 virus that causes COVID-19. Institutional protocols and algorithms that pertain to the evaluation of patients at risk for COVID-19 are in a state of rapid change based on information released by regulatory bodies including the CDC and federal and state organizations. These policies and algorithms were followed during the patient's care in the ED.  Some ED evaluations and interventions may be delayed as a result of limited staffing during and the pandemic.*   Note:  This document was prepared using Dragon voice recognition software and may include unintentional dictation errors.   Paulette Blanch, MD 07/08/20 [phone]

## 2020-07-07 NOTE — ED Triage Notes (Addendum)
Reports that she has chronic back pain; had back surgery years ago. States she is "in between pain management program". Has been off of her pain medications X 1 week. Pt able to ambulate but with back pain, no incontinence.  States she needs pain mediations.

## 2020-07-07 NOTE — Discharge Instructions (Addendum)
You may take Valium as needed for muscle spasms. Return to the ER for worsening symptoms, persistent vomiting, bowel or bladder incontinence, extremity weakness or other concerns.

## 2020-08-02 ENCOUNTER — Emergency Department
Admission: EM | Admit: 2020-08-02 | Discharge: 2020-08-03 | Disposition: A | Discharge status: Home / Self Care | Payer: 59 | Attending: Emergency Medicine | Admitting: Emergency Medicine

## 2020-08-02 ENCOUNTER — Other Ambulatory Visit: Payer: Self-pay

## 2020-08-02 DIAGNOSIS — Z79899 Other long term (current) drug therapy: Secondary | ICD-10-CM | POA: Diagnosis not present

## 2020-08-02 DIAGNOSIS — Z7982 Long term (current) use of aspirin: Secondary | ICD-10-CM | POA: Insufficient documentation

## 2020-08-02 DIAGNOSIS — R45851 Suicidal ideations: Secondary | ICD-10-CM | POA: Diagnosis not present

## 2020-08-02 DIAGNOSIS — F17210 Nicotine dependence, cigarettes, uncomplicated: Secondary | ICD-10-CM | POA: Insufficient documentation

## 2020-08-02 DIAGNOSIS — Z96652 Presence of left artificial knee joint: Secondary | ICD-10-CM | POA: Diagnosis not present

## 2020-08-02 DIAGNOSIS — Z20822 Contact with and (suspected) exposure to covid-19: Secondary | ICD-10-CM | POA: Insufficient documentation

## 2020-08-02 DIAGNOSIS — I10 Essential (primary) hypertension: Secondary | ICD-10-CM | POA: Insufficient documentation

## 2020-08-02 DIAGNOSIS — F3130 Bipolar disorder, current episode depressed, mild or moderate severity, unspecified: Secondary | ICD-10-CM | POA: Insufficient documentation

## 2020-08-02 DIAGNOSIS — Z046 Encounter for general psychiatric examination, requested by authority: Secondary | ICD-10-CM | POA: Diagnosis present

## 2020-08-02 LAB — COMPREHENSIVE METABOLIC PANEL
ALT: 18 U/L (ref 0–44)
AST: 20 U/L (ref 15–41)
Albumin: 4.2 g/dL (ref 3.5–5.0)
Alkaline Phosphatase: 60 U/L (ref 38–126)
Anion gap: 15 (ref 5–15)
BUN: 21 mg/dL — ABNORMAL HIGH (ref 6–20)
CO2: 23 mmol/L (ref 22–32)
Calcium: 10.9 mg/dL — ABNORMAL HIGH (ref 8.9–10.3)
Chloride: 94 mmol/L — ABNORMAL LOW (ref 98–111)
Creatinine, Ser: 1.01 mg/dL — ABNORMAL HIGH (ref 0.44–1.00)
GFR, Estimated: >60 mL/min (ref >60)
Glucose, Bld: 159 mg/dL — ABNORMAL HIGH (ref 70–99)
Potassium: 3.6 mmol/L (ref 3.5–5.1)
Sodium: 132 mmol/L — ABNORMAL LOW (ref 135–145)
Total Bilirubin: 0.6 mg/dL (ref 0.3–1.2)
Total Protein: 8.1 g/dL (ref 6.5–8.1)

## 2020-08-02 LAB — ETHANOL: Alcohol, Ethyl (B): <10 mg/dL (ref <10)

## 2020-08-02 LAB — URINE DRUG SCREEN, QUALITATIVE (ARMC ONLY)
Amphetamines, Ur Screen: NOT DETECTED
Barbiturates, Ur Screen: NOT DETECTED
Benzodiazepine, Ur Scrn: POSITIVE — AB
Cannabinoid 50 Ng, Ur ~~LOC~~: POSITIVE — AB
Cocaine Metabolite,Ur ~~LOC~~: NOT DETECTED
MDMA (Ecstasy)Ur Screen: NOT DETECTED
Methadone Scn, Ur: NOT DETECTED
Opiate, Ur Screen: NOT DETECTED
Phencyclidine (PCP) Ur S: NOT DETECTED
Tricyclic, Ur Screen: POSITIVE — AB

## 2020-08-02 LAB — RESP PANEL BY RT-PCR (FLU A&B, COVID) ARPGX2
Influenza A by PCR: NEGATIVE
Influenza B by PCR: NEGATIVE
SARS Coronavirus 2 by RT PCR: NEGATIVE

## 2020-08-02 LAB — CBC
HCT: 45.7 % (ref 36.0–46.0)
Hemoglobin: 16 g/dL — ABNORMAL HIGH (ref 12.0–15.0)
MCH: 30.7 pg (ref 26.0–34.0)
MCHC: 35 g/dL (ref 30.0–36.0)
MCV: 87.5 fL (ref 80.0–100.0)
Platelets: 427 10*3/uL — ABNORMAL HIGH (ref 150–400)
RBC: 5.22 MIL/uL — ABNORMAL HIGH (ref 3.87–5.11)
RDW: 13 % (ref 11.5–15.5)
WBC: 17.3 10*3/uL — ABNORMAL HIGH (ref 4.0–10.5)
nRBC: 0 % (ref 0.0–0.2)

## 2020-08-02 LAB — POC URINE PREG, ED: Preg Test, Ur: NEGATIVE

## 2020-08-02 LAB — SALICYLATE LEVEL: Salicylate Lvl: <7 mg/dL — ABNORMAL LOW (ref 7.0–30.0)

## 2020-08-02 LAB — PREGNANCY, URINE: Preg Test, Ur: NEGATIVE

## 2020-08-02 LAB — ACETAMINOPHEN LEVEL: Acetaminophen (Tylenol), Serum: <10 ug/mL — ABNORMAL LOW (ref 10–30)

## 2020-08-02 MED ORDER — LORAZEPAM 2 MG PO TABS
2.0000 mg | ORAL_TABLET | Freq: Once | ORAL | Status: AC
  Administered 2020-08-02: 2 mg via ORAL
  Filled 2020-08-02: qty 1

## 2020-08-02 MED ORDER — KETOROLAC TROMETHAMINE 10 MG PO TABS
10.0000 mg | ORAL_TABLET | Freq: Once | ORAL | Status: AC
  Administered 2020-08-02: 10 mg via ORAL
  Filled 2020-08-02: qty 1

## 2020-08-02 MED ORDER — MELATONIN 5 MG PO TABS
10.0000 mg | ORAL_TABLET | Freq: Every day | ORAL | Status: DC
  Administered 2020-08-02: 10 mg via ORAL
  Filled 2020-08-02: qty 2

## 2020-08-02 NOTE — ED Notes (Signed)
Pt provided with [place] sand tray and gingerale now

## 2020-08-02 NOTE — ED Notes (Addendum)
Pt has gray hoodie Gray tank top [employer] and black pants [employer] coke   Tongue ring in mouth pt says that it is stuck mand cannot get it out.

## 2020-08-02 NOTE — ED Notes (Signed)
Pt now complaining that she is unable to sleep since being woken up by TTS after she had fallen asleep, pt continues to be restless. Pt asks for something to "knock her out." Also is concerned that her home pain medications will not be ordered. Pharmacy to be called for med rec. Dr. Vicente Males not at desk, will speak about sleep aid once he is available.

## 2020-08-02 NOTE — ED Notes (Signed)
Pt states that she has to have pain medication for her back now. Reports that she takes buprenorphine for her back pain, pt states if she cannot receive that medication she will leave and states she would murder someone for a cigarette. Dr. Vicente Males made aware of situation and while speaking to him, pt informs security in quad that she wants to leave. Dr. Vicente Males states that if pt wants to leave then she is allowed to can he will not IVC her. Pt is informed of this and states that she feels she should stay and her husband would not want her to leave. Pt asks if she can have something for pain now, Dr. Vicente Males informed and order to follow.

## 2020-08-02 NOTE — ED Notes (Signed)
TTS to bedside. 

## 2020-08-02 NOTE — ED Notes (Signed)
Hourly rounding completed at this time, patient currently awake in room. No complaints, stable, and in no acute distress. Q15 minute rounds and monitoring via Rover and Officer to continue. °

## 2020-08-02 NOTE — ED Provider Notes (Signed)
[name] Emergency Department Provider Note   ____________________________________________   Event Date/Time   First MD Initiated Contact with Patient 08/02/20 1927     (approximate)  I have reviewed the triage vital signs and the nursing notes.   HISTORY  Chief Complaint Psychiatric Evaluation, Anxiety, and Suicidal    HPI Breanna Mathews is a 35 y.o. female with a stated past medical history of bipolar disorder, hypertension, and chronic back pain who presents for suicidal ideation.  Patient states she does not have a definitive plan.  Patient states that she is here for psychiatric evaluation and refuses to answer any other questions until she receives her pain medication.  Patient states that she takes Suboxone and wants this before she will answer any further questions         Past Medical History:  Diagnosis Date  . Angina of effort (HCC)   . Anxiety   . Bipolar disorder with depression (HCC)   . Chronic back pain   . GERD (gastroesophageal reflux disease)   . Hypertension   . Mitral valve prolapse     Patient Active Problem List   Diagnosis Date Noted  . Abdominal pain in pregnancy 01/09/2017  . Drug-seeking behavior 07/30/2016  . Opioid use agreement exists 07/30/2016  . Acute low back pain 06/19/2016  . Chronic jaw pain 01/16/2016  . Oropharyngeal lesion 01/16/2016  . Mid sternal chest pain 10/04/2015  . Bipolar disorder with depression (HCC) 09/03/2015  . 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 Spell of altered consciousness 05/15/2015  . Dependence on nicotine from cigarettes 04/10/2015  . History of hypertension 04/10/2015  . Intentional fentanyl overdose (HCC) 04/10/2015  . Sinus tachycardia 04/10/2015  . Suicidal ideation 04/10/2015  . Chronic pain disorder 04/09/2015  . Dissociative episodes 04/09/2015  . Mood disorder (HCC) 04/09/2015  . Opioid use disorder, severe, dependence (HCC) 04/09/2015  . Chronic  bilateral low back pain with bilateral sciatica 03/08/2015  . Borderline personality disorder (HCC) 01/16/2015  . 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 Uncomplicated opioid dependence (HCC) 06/22/2014  . Essential (primary) hypertension 03/17/2013  . Migraine with aura and without status migrainosus, not intractable 03/17/2013  . Other specified anxiety disorders 03/17/2013  . Spondylosis of cervical region without myelopathy or radiculopathy 07/11/2012  . Spondylosis of thoracic region without myelopathy or radiculopathy 07/11/2012  . Cannabis abuse, uncomplicated 11/27/2011    Past Surgical History:  Procedure Laterality Date  . APPENDECTOMY    . BACK SURGERY    . CESAREAN SECTION    . KNEE ARTHROPLASTY Left    x 2  . POLYPECTOMY    . TONSILLECTOMY      Prior to Admission medications   Medication Sig Start Date End Date Taking? Authorizing Provider  acetaminophen (TYLENOL) 500 MG tablet Take 1,000 mg by mouth 3 (three) times daily.    [provider]  aspirin 81 MG chewable tablet Chew 81 mg by mouth 2 (two) times daily.    [provider]  buprenorphine (SUBUTEX) 8 MG SUBL SL tablet Place 4 mg under the tongue 3 (three) times daily. 11/02/16   [provider]  diazepam (VALIUM) 5 MG tablet Take 1 tablet (5 mg total) by mouth every 8 (eight) hours as needed for anxiety. 07/07/20   Irean Hong, MD  diphenhydrAMINE (BENADRYL) 50 MG capsule Take 50 mg by mouth 3 (three) times daily.    [provider]  labetalol (  NORMODYNE) 100 MG tablet Take 2 tablets (200 mg total) by mouth 2 (two) times daily. 12/20/16   Willy Eddy, MD  lamoTRIgine (LAMICTAL) 25 MG tablet Take 25 mg by mouth 2 (two) times daily.    [provider]  pyridOXINE (VITAMIN B-6) 50 MG tablet Take 1 tablet (50 mg total) by mouth 3 (three) times daily. 11/17/16   Ward, Elenora Fender, MD  sertraline (ZOLOFT) 50 MG tablet Take 150 mg by  mouth daily. 09/07/16   [provider]    Allergies Oxymorphone, Keflex [cephalexin], Lithium, and Opana [oxymorphone hcl]  No family history on file.  Social History Social History   Tobacco Use  . Smoking status: Current Every Day Smoker    Packs/day: 0.50    Types: Cigarettes  . Smokeless tobacco: Never Used  . Tobacco comment: 2 cigarettes/day  Substance Use Topics  . Alcohol use: No  . Drug use: No    Review of Systems Unable to assess  ____________________________________________   PHYSICAL EXAM:  VITAL SIGNS: ED Triage Vitals [08/02/20 1901]  Enc Vitals Group     BP 136/80     Pulse Rate (!) 120     Resp 20     Temp 98.2 F (36.8 C)     Temp Source Oral     SpO2 100 %     Weight 230 lb (104.3 kg)     Height 5\' 10"  (1.778 m)     Head Circumference      Peak Flow      Pain Score 8     Pain Loc      Pain Edu?      Excl. in GC?     Constitutional: Alert and oriented. Well appearing obese female in no acute distress. Eyes: Conjunctivae are normal. PERRL. Head: Atraumatic. Nose: No congestion/rhinnorhea. Mouth/Throat: Mucous membranes are moist. Neck: No stridor Cardiovascular: Grossly normal heart sounds.  Good peripheral circulation. Respiratory: Normal respiratory effort.  No retractions. Gastrointestinal: Soft and nontender. No distention. Musculoskeletal: No obvious deformities Neurologic:  Normal speech and language. No gross focal neurologic deficits are appreciated. Skin:  Skin is warm and dry. No rash noted. Psychiatric: Mood and affect are normal. Speech and behavior are normal.  ____________________________________________   LABS (all labs ordered are listed, but only abnormal results are displayed)  Labs Reviewed  COMPREHENSIVE METABOLIC PANEL - Abnormal; Notable for the following components:      Result Value   Sodium 132 (*)    Chloride 94 (*)    Glucose, Bld 159 (*)    BUN 21 (*)    Creatinine, Ser 1.01 (*)     Calcium 10.9 (*)    All other components within normal limits  SALICYLATE LEVEL - Abnormal; Notable for the following components:   Salicylate Lvl <7.0 (*)    All other components within normal limits  ACETAMINOPHEN LEVEL - Abnormal; Notable for the following components:   Acetaminophen (Tylenol), Serum <10 (*)    All other components within normal limits  CBC - Abnormal; Notable for the following components:   WBC 17.3 (*)    RBC 5.22 (*)    Hemoglobin 16.0 (*)    Platelets 427 (*)    All other components within normal limits  URINE DRUG SCREEN, QUALITATIVE ([facility] ONLY) - Abnormal; Notable for the following components:   Tricyclic, Ur Screen POSITIVE (*)    Cannabinoid 50 Ng, Ur [place] POSITIVE (*)    Benzodiazepine, Ur Scrn POSITIVE (*)  All other components within normal limits  RESP PANEL BY RT-PCR (FLU A&B, COVID) ARPGX2  ETHANOL  PREGNANCY, URINE  POC URINE PREG, ED    PROCEDURES  Procedure(s) performed (including Critical Care):  Procedures   ____________________________________________   INITIAL IMPRESSION / ASSESSMENT AND PLAN / ED COURSE  As part of my medical decision making, I reviewed the following data within the electronic MEDICAL RECORD NUMBER Nursing notes reviewed and incorporated, Labs reviewed, Old chart reviewed, and Notes from prior ED visits reviewed and incorporated        Thoughts are linear and organized, and patient has no AH, VH, or HI. Prior suicide attempt: Unknown Prior Psychiatric Hospitalizations: Multiple  Clinically patient displays no overt toxidrome; they are well appearing, with low suspicion for toxic ingestion given history and exam. Thoughts unlikely 2/2 anemia, hypothyroidism, infection, or ICH.  Consult: Psychiatry to evaluate patient for potential hold for danger to self. Disposition: Care of this patient will be signed out to the oncoming physician at the end of my shift.  All pertinent patient information conveyed and all  questions answered.  All further care and disposition decisions will be made by the oncoming physician.      ____________________________________________   FINAL CLINICAL IMPRESSION(S) / ED DIAGNOSES  Final diagnoses:  Suicidal ideation     ED Discharge Orders    None       Note:  This document was prepared using Dragon voice recognition software and may include unintentional dictation errors.   Merwyn Katos, MD 08/02/20 [phone]

## 2020-08-02 NOTE — ED Notes (Signed)
Pt reports HI from time to time, no specific person. States she has had no food in one week, is paranoid, no meds for one month. Pt later asks for food and drinks, states she needs beverage 24/7 in room due to dry mouth that is caused by her medications. Pt states she moved away and has came back and has not been able to see psychiatrist since returning.

## 2020-08-02 NOTE — ED Notes (Signed)
Lights off in room and door closed

## 2020-08-02 NOTE — ED Triage Notes (Signed)
Pt states she is suicidal and here for a psych evaluation. Pt states she has chronic pain and has not had her pain medication in hours.

## 2020-08-02 NOTE — ED Notes (Signed)
Pt complains of anxiety and requests her home meds now, Dr. Vicente Males notified

## 2020-08-02 NOTE — ED Notes (Signed)
POC not completed despite being checked off, Urine Preg ordered now, lab called to process order

## 2020-08-02 NOTE — ED Notes (Signed)
Patient transferred from Triage to room 20 after dressing out and screening for contraband.  Pt oriented to [employer] including Q15 minute rounds as well as Psychologist, counselling for their protection. Patient is alert and oriented, warm and dry in no acute distress. Patient denies VH. Pt. Encouraged to let this nurse know if needs arise.

## 2020-08-02 NOTE — ED Notes (Signed)
Pt had stated she needs her blood pressure medication, stated that she knew it was high because she was seeing spots and "I only see spots when my blood pressure is high." Vitals were collected as charted. Pt then is assured of vitals and consistency with triage. Pt then reports, "Oh, now I know the issue. My blood pressure is really low right now." Nurse informs pt that BP is not low and then pt replies, "No, call my husband as ask, my blood pressure is consistently 285/something, that is low for me." Pt educated that she would have a stroke with this bp daily and pt assures that this is not true. PT informed that ativan will assist with this anxiety and feelings, pt states her BP is too low and she will pass out. Pt informed that she is in the ER and if she passes out we can care for her properly and that pt should remain in bed if she has these feelings.

## 2020-08-03 MED ORDER — ACETAMINOPHEN 500 MG PO TABS: 1000.0000 mg | ORAL_TABLET | Freq: Four times a day (QID) | ORAL | Status: DC | PRN

## 2020-08-03 MED ORDER — HYDROXYZINE HCL 25 MG PO TABS
25.0000 mg | ORAL_TABLET | Freq: Once | ORAL | Status: AC
  Administered 2020-08-03: 25 mg via ORAL

## 2020-08-03 MED ORDER — VITAMIN B-6 50 MG PO TABS
50.0000 mg | ORAL_TABLET | Freq: Three times a day (TID) | ORAL | Status: DC
Start: 2020-08-03 — End: 2020-08-03
  Administered 2020-08-03 (×2): 50 mg via ORAL
  Filled 2020-08-03 (×3): qty 1

## 2020-08-03 MED ORDER — BUPRENORPHINE HCL-NALOXONE HCL 8-2 MG SL SUBL
1.0000 | SUBLINGUAL_TABLET | Freq: Once | SUBLINGUAL | Status: AC
  Administered 2020-08-03: 1 via SUBLINGUAL
  Filled 2020-08-03: qty 1

## 2020-08-03 MED ORDER — ASPIRIN 81 MG PO CHEW
81.0000 mg | CHEWABLE_TABLET | Freq: Two times a day (BID) | ORAL | Status: DC
Start: 2020-08-03 — End: 2020-08-03
  Administered 2020-08-03: 81 mg via ORAL
  Filled 2020-08-03 (×2): qty 1

## 2020-08-03 MED ORDER — NICOTINE 21 MG/24HR TD PT24
21.0000 mg | MEDICATED_PATCH | Freq: Once | TRANSDERMAL | Status: DC
  Administered 2020-08-03: 21 mg via TRANSDERMAL

## 2020-08-03 MED ORDER — CARISOPRODOL 350 MG PO TABS
350.0000 mg | ORAL_TABLET | Freq: Three times a day (TID) | ORAL | Status: DC
Start: 2020-08-03 — End: 2020-08-03

## 2020-08-03 MED ORDER — HYDROXYZINE HCL 25 MG PO TABS: 25.0000 mg | ORAL_TABLET | Freq: Four times a day (QID) | ORAL | Status: DC | PRN

## 2020-08-03 MED ORDER — BUPRENORPHINE HCL-NALOXONE HCL 8-2 MG SL SUBL
1.0000 | SUBLINGUAL_TABLET | Freq: Three times a day (TID) | SUBLINGUAL | Status: DC
  Administered 2020-08-03: 1 via SUBLINGUAL
  Filled 2020-08-03: qty 1

## 2020-08-03 MED ORDER — FLUOXETINE HCL 20 MG PO CAPS: 20.0000 mg | ORAL_CAPSULE | Freq: Every day | ORAL | 0 refills | Status: DC

## 2020-08-03 MED ORDER — LABETALOL HCL 200 MG PO TABS
200.0000 mg | ORAL_TABLET | Freq: Two times a day (BID) | ORAL | Status: DC
  Administered 2020-08-03: 200 mg via ORAL
  Filled 2020-08-03 (×2): qty 1

## 2020-08-03 MED ORDER — LAMOTRIGINE 25 MG PO TABS
25.0000 mg | ORAL_TABLET | Freq: Two times a day (BID) | ORAL | Status: DC
  Administered 2020-08-03: 25 mg via ORAL
  Filled 2020-08-03 (×2): qty 1

## 2020-08-03 MED ORDER — ACETAMINOPHEN 325 MG PO TABS
ORAL_TABLET | ORAL | Status: AC
  Administered 2020-08-03: 1000 mg via ORAL
  Filled 2020-08-03: qty 2

## 2020-08-03 MED ORDER — ACETAMINOPHEN 325 MG PO TABS: 650.0000 mg | ORAL_TABLET | Freq: Once | ORAL | Status: DC

## 2020-08-03 MED ORDER — SERTRALINE HCL 100 MG PO TABS
150.0000 mg | ORAL_TABLET | Freq: Every day | ORAL | Status: DC
Start: 2020-08-03 — End: 2020-08-03
  Administered 2020-08-03: 150 mg via ORAL
  Filled 2020-08-03: qty 1

## 2020-08-03 NOTE — Discharge Instructions (Addendum)
Please seek medical attention and help for any thoughts about wanting to harm yourself, harm others, any concerning change in behavior, severe depression, inappropriate drug use or any other new or concerning symptoms. ° °

## 2020-08-03 NOTE — ED Notes (Signed)
Hourly rounding completed at this time, patient currently asleep in room. No complaints, stable, and in no acute distress. Q15 minute rounds and monitoring via Rover and Officer to continue. 

## 2020-08-03 NOTE — BH Assessment (Signed)
Referral Check   [facility] [phone]) Per Breanna Mathews there are no beds available at this time.    Breanna Mathews [phone]) No answer    Breanna Mathews ([phone]---[phone]) No answer    [facility] [phone]) No answer    Breanna Mathews [phone] -or- [phone]) Breanna Mathews request re-fax    [facility] [phone]) left voicemail   Breanna Mathews [phone]) Left voicemail

## 2020-08-03 NOTE — ED Provider Notes (Signed)
-----------------------------------------   3:01 PM on 08/03/2020 -----------------------------------------  Patient is complaining of severe back pain.  I reviewed the patient's PDMP, she has prescribed buprenorphine 8 mg 3 times daily as well as Soma 350 mg 3 times daily.  We will reorder the patient's home medications while awaiting psychiatric evaluation and disposition.   Minna Antis, MD 08/03/20 1501

## 2020-08-03 NOTE — ED Notes (Signed)
In day room hanging out with other patients and engaging in conersation

## 2020-08-03 NOTE — ED Provider Notes (Signed)
Emergency Medicine Observation Re-evaluation Note  Breanna Mathews is a 35 y.o. female, seen on rounds today.  Pt initially presented to the ED for complaints of Psychiatric Evaluation, Anxiety, and Suicidal Currently, the patient is resting.  Physical Exam  BP 130/70   Pulse (!) 101   Temp 98.2 F (36.8 C) (Oral)   Resp 18   Ht 1.778 m (5\' 10" )   Wt 104.3 kg   LMP 07/19/2020   SpO2 97%   BMI 33.00 kg/m  Physical Exam Gen:  No acute distress Resp:  Breathing easily and comfortably, no accessory muscle usage Neuro:  Moving all four extremities, no gross focal neuro deficits Psych:  Resting currently, somewhat demanding when awake, related to her chronic pain condition.  ED Course / MDM  EKG:    I have reviewed the labs performed to date as well as medications administered while in observation.  Recent changes in the last 24 hours include evaluation by psychiatry.  Plan  Current plan is for psychiatric disposition, likely voluntary placement.  Patient does not meet IVC criteria at this time and can leave if she changes her mind and decides to do so. Patient is not under full IVC at this time.   07/21/2020, MD 08/03/20 [phone]

## 2020-08-03 NOTE — ED Notes (Signed)
Patient has been continuously requesting pain meds Md aware PRN order has been obtained, patient not happy with choice of meds being provided. Asking to see a doctor so she can sign herself out go home and take her own medication percribed by pain management provider. . Md made aware.

## 2020-08-03 NOTE — ED Notes (Signed)
Pt was wheeled down to [facility] and took breakfast tray with her to eat there.

## 2020-08-03 NOTE — ED Notes (Signed)
SOC consult in progress.  

## 2020-08-03 NOTE — ED Notes (Signed)
Pt asleep at this time, unable to collect vitals. Will collect pt vitals once awake. 

## 2020-08-03 NOTE — ED Notes (Signed)
In dayroom conversing with other patients.

## 2020-08-03 NOTE — BH Assessment (Signed)
Referral information for Psychiatric Hospitalization faxed to;    [facility] [phone])   . [facility] [phone]),   . [facility] ([phone]),  . [facility] [phone]),   . [facility] [phone] -or- [phone]),   . [place] [phone]),   . [facility] [phone]).
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
## 2020-10-27 ENCOUNTER — Encounter: Payer: Self-pay | Admitting: Emergency Medicine

## 2020-10-27 ENCOUNTER — Other Ambulatory Visit: Payer: Self-pay

## 2020-10-27 ENCOUNTER — Emergency Department
Admission: EM | Admit: 2020-10-27 | Discharge: 2020-10-27 | Discharge status: Against Medical Advice | Payer: 59 | Attending: Emergency Medicine | Admitting: Emergency Medicine

## 2020-10-27 DIAGNOSIS — Z79899 Other long term (current) drug therapy: Secondary | ICD-10-CM | POA: Diagnosis not present

## 2020-10-27 DIAGNOSIS — I10 Essential (primary) hypertension: Secondary | ICD-10-CM | POA: Insufficient documentation

## 2020-10-27 DIAGNOSIS — F17210 Nicotine dependence, cigarettes, uncomplicated: Secondary | ICD-10-CM | POA: Diagnosis not present

## 2020-10-27 DIAGNOSIS — R569 Unspecified convulsions: Secondary | ICD-10-CM | POA: Diagnosis not present

## 2020-10-27 DIAGNOSIS — Z7982 Long term (current) use of aspirin: Secondary | ICD-10-CM | POA: Insufficient documentation

## 2020-10-27 LAB — CBC WITH DIFFERENTIAL/PLATELET
Abs Immature Granulocytes: 0.07 10*3/uL (ref 0.00–0.07)
Basophils Absolute: 0.1 10*3/uL (ref 0.0–0.1)
Basophils Relative: 1 %
Eosinophils Absolute: 0.1 10*3/uL (ref 0.0–0.5)
Eosinophils Relative: 1 %
HCT: 43.5 % (ref 36.0–46.0)
Hemoglobin: 14.9 g/dL (ref 12.0–15.0)
Immature Granulocytes: 1 %
Lymphocytes Relative: 20 %
Lymphs Abs: 2.4 10*3/uL (ref 0.7–4.0)
MCH: 30.8 pg (ref 26.0–34.0)
MCHC: 34.3 g/dL (ref 30.0–36.0)
MCV: 90.1 fL (ref 80.0–100.0)
Monocytes Absolute: 0.3 10*3/uL (ref 0.1–1.0)
Monocytes Relative: 3 %
Neutro Abs: 8.8 10*3/uL — ABNORMAL HIGH (ref 1.7–7.7)
Neutrophils Relative %: 74 %
Platelets: 370 10*3/uL (ref 150–400)
RBC: 4.83 MIL/uL (ref 3.87–5.11)
RDW: 13.1 % (ref 11.5–15.5)
WBC: 11.7 10*3/uL — ABNORMAL HIGH (ref 4.0–10.5)
nRBC: 0 % (ref 0.0–0.2)

## 2020-10-27 LAB — TROPONIN I (HIGH SENSITIVITY): Troponin I (High Sensitivity): 3 ng/L (ref <18)

## 2020-10-27 MED ORDER — BUPRENORPHINE HCL-NALOXONE HCL 8-2 MG SL SUBL: 1.0000 | SUBLINGUAL_TABLET | Freq: Once | SUBLINGUAL | Status: DC

## 2020-10-27 MED ORDER — DROPERIDOL 2.5 MG/ML IJ SOLN
2.5000 mg | Freq: Once | INTRAMUSCULAR | Status: AC
  Administered 2020-10-27: 2.5 mg via INTRAVENOUS
  Filled 2020-10-27: qty 2

## 2020-10-27 MED ORDER — LACTATED RINGERS IV BOLUS
1000.0000 mL | Freq: Once | INTRAVENOUS | Status: AC
  Administered 2020-10-27: 1000 mL via INTRAVENOUS

## 2020-10-27 NOTE — ED Notes (Signed)
Dr. Hanin Decook at bedside.

## 2020-10-27 NOTE — ED Notes (Signed)
Secretary called this RN back to say pt took her IV out and left

## 2020-10-27 NOTE — ED Notes (Signed)
Secretary called to stated that pt would like to speak with RN

## 2020-10-27 NOTE — ED Triage Notes (Signed)
Pt reports has seizures and will have electric shocks through her body prior to those seizures. Pt states she has had several seizures over the last few days and the electric shocks are still shooting through her body and are painful. Pt reports is current and compliant with prescribed medications.

## 2020-10-27 NOTE — ED Provider Notes (Signed)
[facility] Emergency Department Provider Note ____________________________________________   Event Date/Time   First MD Initiated Contact with Patient 10/27/20 [id number]     (approximate)  I have reviewed the triage vital signs and the nursing notes.  HISTORY  Chief Complaint Seizures   HPI Breanna Mathews is a 35 y.o. femalewho presents to the ED for evaluation of seizures.   Chart review indicates history of obesity, anxiety disorder and Bipolar, HTN, chronic lumbar pain syndrome on long-term opiates.  I see no chart- related evidence of seizure disorder on antiepileptics, or neurology evaluation.  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  Patient just to the ED due to concern for worsening of her seizures.  She reports having "a lot of seizures" last week with about 6 episodes across 7 days, resolving 2 days ago.  She reports a prodrome before her seizures of electric jolting sensation throughout her body, bilateral arms twitching, and then having a seizure.  She reports amnesia after these events for few hours, but then she later recalls everything that occurred later in the day.  For the past 2 days, she reports the persistence of intermittent jolting sensations throughout her body but not having any further arm twitching or seizures.  She reports difficulty sleeping due to this persistent jolting sensation.   She reports frustration with her pain management provider who cut her buprenorphine dosage in half earlier this month, so she has been dealing with increased stress at home related to chronic pain.  She reports increased symptoms within this same timeframe.  Denies recent fevers, illnesses, chest pain, cough or shortness of breath.  Denies headaches or neck pain.  Denies falls or trauma.  Denies syncopal events outside of her seizure syndrome.  Reports tolerating p.o. intake and toileting at her baseline.  Past Medical History:  Diagnosis Date  . Angina of  effort (HCC)   . Anxiety   . Bipolar disorder with depression (HCC)   . Chronic back pain   . GERD (gastroesophageal reflux disease)   . Hypertension   . Mitral valve prolapse     Patient Active Problem List   Diagnosis Date Noted  . Abdominal pain in pregnancy 01/09/2017  . Drug-seeking behavior 07/30/2016  . Opioid use agreement exists 07/30/2016  . Acute low back pain 06/19/2016  . Chronic jaw pain 01/16/2016  . Oropharyngeal lesion 01/16/2016  . Mid sternal chest pain 10/04/2015  . Bipolar disorder with depression (HCC) 09/03/2015  . 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 Spell of altered consciousness 05/15/2015  . Dependence on nicotine from cigarettes 04/10/2015  . History of hypertension 04/10/2015  . Intentional fentanyl overdose (HCC) 04/10/2015  . Sinus tachycardia 04/10/2015  . Suicidal ideation 04/10/2015  . Chronic pain disorder 04/09/2015  . Dissociative episodes 04/09/2015  . Mood disorder (HCC) 04/09/2015  . Opioid use disorder, severe, dependence (HCC) 04/09/2015  . Chronic bilateral low back pain with bilateral sciatica 03/08/2015  . Borderline personality disorder (HCC) 01/16/2015  . 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 Uncomplicated opioid dependence (HCC) 06/22/2014  . Essential (primary) hypertension 03/17/2013  . Migraine with aura and without status migrainosus, not intractable 03/17/2013  . Other specified anxiety disorders 03/17/2013  . Spondylosis of cervical region without myelopathy or radiculopathy 07/11/2012  . Spondylosis of thoracic region without myelopathy or radiculopathy 07/11/2012  . Cannabis abuse, uncomplicated 11/27/2011    Past Surgical History:  Procedure Laterality Date  . APPENDECTOMY    .  BACK SURGERY    . CESAREAN SECTION    . KNEE ARTHROPLASTY Left    x 2  . POLYPECTOMY    . TONSILLECTOMY      Prior to Admission medications   Medication Sig Start Date End Date  Taking? Authorizing Provider  acetaminophen (TYLENOL) 500 MG tablet Take 1,000 mg by mouth 3 (three) times daily.    [provider]  aspirin 81 MG chewable tablet Chew 81 mg by mouth 2 (two) times daily.    [provider]  buprenorphine (SUBUTEX) 8 MG SUBL SL tablet Place 4 mg under the tongue 3 (three) times daily. 11/02/16   [provider]  diazepam (VALIUM) 5 MG tablet Take 1 tablet (5 mg total) by mouth every 8 (eight) hours as needed for anxiety. 07/07/20   Irean Hong, MD  diphenhydrAMINE (BENADRYL) 50 MG capsule Take 50 mg by mouth 3 (three) times daily.    [provider]  FLUoxetine (PROZAC) 20 MG capsule Take 1 capsule (20 mg total) by mouth daily. 08/03/20 08/03/21  Phineas Semen, MD  labetalol (NORMODYNE) 100 MG tablet Take 2 tablets (200 mg total) by mouth 2 (two) times daily. 12/20/16   Willy Eddy, MD  lamoTRIgine (LAMICTAL) 25 MG tablet Take 25 mg by mouth 2 (two) times daily.    [provider]  pyridOXINE (VITAMIN B-6) 50 MG tablet Take 1 tablet (50 mg total) by mouth 3 (three) times daily. 11/17/16   Ward, Elenora Fender, MD  sertraline (ZOLOFT) 50 MG tablet Take 150 mg by mouth daily. 09/07/16   [provider]    Allergies Oxymorphone, Keflex [cephalexin], Lithium, and Opana [oxymorphone hcl]  No family history on file.  Social History Social History   Tobacco Use  . Smoking status: Current Every Day Smoker    Packs/day: 0.50    Types: Cigarettes  . Smokeless tobacco: Never Used  . Tobacco comment: 2 cigarettes/day  Substance Use Topics  . Alcohol use: No  . Drug use: No    Review of Systems  Constitutional: No fever/chills Eyes: No visual changes. ENT: No sore throat. Cardiovascular: Denies chest pain. Respiratory: Denies shortness of breath. Gastrointestinal: No abdominal pain.  No nausea, no vomiting.  No diarrhea.  No constipation. Genitourinary: Negative for dysuria. Musculoskeletal: Negative for  back pain. Skin: Negative for rash. Neurological: Negative for headaches, focal weakness or numbness.  Positive for seizures and twitching events  ____________________________________________   PHYSICAL EXAM:  VITAL SIGNS: Vitals:   10/27/20 0738 10/27/20 0830  BP: (!) 161/82 130/72  Pulse: 99 (!) 102  Resp: 20 (!) 26  Temp: 98.4 F (36.9 C)   SpO2: 97% 98%      Constitutional: Alert and oriented. Well appearing and in no acute distress.  Obese.  Sitting up on the side of the bed with her legs crossed and sitting upright on her own.  Conversational in full sentences without distress.  There is no twitching episodes throughout our conversation, but she does intermittently twitch in a generalized fashion after I get up and begin examining her. Able to stand and ambulate independently with a normal gait, while complaining of back pain. Eyes: Conjunctivae are normal. PERRL. EOMI. Head: Atraumatic. Nose: No congestion/rhinnorhea. Mouth/Throat: Mucous membranes are moist.  Oropharynx non-erythematous. Neck: No stridor. No cervical spine tenderness to palpation. Cardiovascular: Normal rate, regular rhythm. Grossly normal heart sounds.  Good peripheral circulation. Respiratory: Normal respiratory effort.  No retractions. Lungs CTAB. Gastrointestinal: Soft , nondistended, nontender to  palpation. No CVA tenderness. Musculoskeletal: No lower extremity tenderness nor edema.  No joint effusions. No signs of acute trauma. Well-healed midline surgical incisions to the back.  No bony step-offs or signs of trauma to the back. Neurologic:  Normal speech and language. No gross focal neurologic deficits are appreciated. No gait instability noted. Cranial nerves II through XII intact 5/5 strength and sensation in all 4 extremities Skin:  Skin is warm, dry and intact. No rash noted. Psychiatric: Mood and affect are normal. Speech and behavior are  normal.  ____________________________________________   LABS (all labs ordered are listed, but only abnormal results are displayed)  Labs Reviewed  CBC WITH DIFFERENTIAL/PLATELET - Abnormal; Notable for the following components:      Result Value   WBC 11.7 (*)    Neutro Abs 8.8 (*)    All other components within normal limits  URINALYSIS, COMPLETE (UACMP) WITH MICROSCOPIC  COMPREHENSIVE METABOLIC PANEL  MAGNESIUM  POC URINE PREG, ED  TROPONIN I (HIGH SENSITIVITY)   ____________________________________________  12 Lead EKG   ____________________________________________  RADIOLOGY  ED MD interpretation:    Official radiology report(s): No results found.  ____________________________________________   PROCEDURES and INTERVENTIONS  Procedure(s) performed (including Critical Care):  .1-3 Lead EKG Interpretation Performed by: Delton PrairieSmith, Genella Bas, MD Authorized by: Delton PrairieSmith, Orren Pietsch, MD     Interpretation: normal     ECG rate:  98   ECG rate assessment: normal     Rhythm: sinus rhythm     Ectopy: none     Conduction: normal      Medications  buprenorphine-naloxone (SUBOXONE) 8-2 mg per SL tablet 1 tablet (has no administration in time range)  droperidol (INAPSINE) 2.5 MG/ML injection 2.5 mg (2.5 mg Intravenous Given 10/27/20 0821)  lactated ringers bolus 1,000 mL (1,000 mLs Intravenous New Bag/Given 10/27/20 0820)    ____________________________________________   MDM / ED COURSE   35 year old woman with history of bipolar disorder and PNES presents to the ED with concern for seizure-like activity and electric shocks throughout her body recurrently.  She looks well to me clinically with borderline tachycardia but otherwise normal vital signs.  No signs of neurologic or vascular deficits, trauma or any significant distress.  Work-up so far benign, but patient eloped without telling anyone.  This was just after I reassessed her, she was expressing anxiety and we discussed  home dosing of buprenorphine.  She did not get this medication prior to eloping.  Patient ambulated out in no acute distress independently.  Clinical Course as of 10/27/20 [id number]  Wynelle LinkSun Oct 27, 2020  [id number] Review [facility] hospitalist admission from 11/2016 noted that her seizure disorder had an extensive work-up outside of the state, but I am unable to see in the chart, in 05/2015 consistent with PNES [DS]  0910 Reassessed.  Patient still sitting up on the side of the bed and reports severe anxiety.  She feels to elaborate upon what she is anxious about and is requesting her home dose of buprenorphine. [DS]  0912 After ordering her home dose of buprenorphine.  Nurse comes up to me and let me know that patient has ripped out her IV and ambulate out of the ED independently. [DS]    Clinical Course User Index [DS] Delton PrairieSmith, Ledarrius Beauchaine, MD    ____________________________________________   FINAL CLINICAL IMPRESSION(S) / ED DIAGNOSES  Final diagnoses:  Seizure-like activity [facility](HCC)     ED Discharge Orders    None       Nathanyal Ashmead   Note:  This document was prepared using Dragon voice recognition software and may include unintentional dictation errors.   Delton Prairie, MD 10/27/20 [phone]

## 2021-02-12 ENCOUNTER — Encounter: Payer: Self-pay | Admitting: Emergency Medicine

## 2021-02-12 ENCOUNTER — Emergency Department
Admission: EM | Admit: 2021-02-12 | Discharge: 2021-02-13 | Disposition: A | Discharge status: Home / Self Care | Payer: 59 | Attending: Emergency Medicine | Admitting: Emergency Medicine

## 2021-02-12 ENCOUNTER — Other Ambulatory Visit: Payer: Self-pay

## 2021-02-12 DIAGNOSIS — R519 Headache, unspecified: Secondary | ICD-10-CM | POA: Diagnosis not present

## 2021-02-12 DIAGNOSIS — Z79899 Other long term (current) drug therapy: Secondary | ICD-10-CM | POA: Insufficient documentation

## 2021-02-12 DIAGNOSIS — R197 Diarrhea, unspecified: Secondary | ICD-10-CM | POA: Insufficient documentation

## 2021-02-12 DIAGNOSIS — Z7982 Long term (current) use of aspirin: Secondary | ICD-10-CM | POA: Insufficient documentation

## 2021-02-12 DIAGNOSIS — F1193 Opioid use, unspecified with withdrawal: Secondary | ICD-10-CM | POA: Insufficient documentation

## 2021-02-12 DIAGNOSIS — F1123 Opioid dependence with withdrawal: Secondary | ICD-10-CM

## 2021-02-12 DIAGNOSIS — I10 Essential (primary) hypertension: Secondary | ICD-10-CM | POA: Diagnosis not present

## 2021-02-12 DIAGNOSIS — F17210 Nicotine dependence, cigarettes, uncomplicated: Secondary | ICD-10-CM | POA: Diagnosis not present

## 2021-02-12 DIAGNOSIS — Z96652 Presence of left artificial knee joint: Secondary | ICD-10-CM | POA: Insufficient documentation

## 2021-02-12 LAB — CBC
HCT: 48.4 % — ABNORMAL HIGH (ref 36.0–46.0)
Hemoglobin: 16.8 g/dL — ABNORMAL HIGH (ref 12.0–15.0)
MCH: 30.5 pg (ref 26.0–34.0)
MCHC: 34.7 g/dL (ref 30.0–36.0)
MCV: 87.8 fL (ref 80.0–100.0)
Platelets: 357 10*3/uL (ref 150–400)
RBC: 5.51 MIL/uL — ABNORMAL HIGH (ref 3.87–5.11)
RDW: 13.5 % (ref 11.5–15.5)
WBC: 16.2 10*3/uL — ABNORMAL HIGH (ref 4.0–10.5)
nRBC: 0 % (ref 0.0–0.2)

## 2021-02-12 LAB — COMPREHENSIVE METABOLIC PANEL
ALT: 20 U/L (ref 0–44)
AST: 22 U/L (ref 15–41)
Albumin: 4.5 g/dL (ref 3.5–5.0)
Alkaline Phosphatase: 68 U/L (ref 38–126)
Anion gap: 9 (ref 5–15)
BUN: 11 mg/dL (ref 6–20)
CO2: 21 mmol/L — ABNORMAL LOW (ref 22–32)
Calcium: 9 mg/dL (ref 8.9–10.3)
Chloride: 101 mmol/L (ref 98–111)
Creatinine, Ser: 0.71 mg/dL (ref 0.44–1.00)
GFR, Estimated: >60 mL/min (ref >60)
Glucose, Bld: 179 mg/dL — ABNORMAL HIGH (ref 70–99)
Potassium: 3.7 mmol/L (ref 3.5–5.1)
Sodium: 131 mmol/L — ABNORMAL LOW (ref 135–145)
Total Bilirubin: 0.7 mg/dL (ref 0.3–1.2)
Total Protein: 8.5 g/dL — ABNORMAL HIGH (ref 6.5–8.1)

## 2021-02-12 LAB — URINALYSIS, COMPLETE (UACMP) WITH MICROSCOPIC
Bilirubin Urine: NEGATIVE
Glucose, UA: NEGATIVE mg/dL
Hgb urine dipstick: NEGATIVE
Ketones, ur: NEGATIVE mg/dL
Leukocytes,Ua: NEGATIVE
Nitrite: NEGATIVE
Protein, ur: NEGATIVE mg/dL
Specific Gravity, Urine: 1.023 (ref 1.005–1.030)
pH: 5 (ref 5.0–8.0)

## 2021-02-12 LAB — POC URINE PREG, ED: Preg Test, Ur: NEGATIVE

## 2021-02-12 LAB — LIPASE, BLOOD: Lipase: 28 U/L (ref 11–51)

## 2021-02-12 MED ORDER — LOPERAMIDE HCL 2 MG PO CAPS
4.0000 mg | ORAL_CAPSULE | Freq: Once | ORAL | Status: AC
  Administered 2021-02-13: 4 mg via ORAL
  Filled 2021-02-12: qty 2

## 2021-02-12 MED ORDER — KETOROLAC TROMETHAMINE 30 MG/ML IJ SOLN
30.0000 mg | Freq: Once | INTRAMUSCULAR | Status: AC
  Administered 2021-02-13: 30 mg via INTRAVENOUS
  Filled 2021-02-12: qty 1

## 2021-02-12 MED ORDER — SODIUM CHLORIDE 0.9 % IV BOLUS (SEPSIS)
1000.0000 mL | Freq: Once | INTRAVENOUS | Status: AC
  Administered 2021-02-12: 1000 mL via INTRAVENOUS

## 2021-02-12 MED ORDER — ONDANSETRON 4 MG PO TBDP
4.0000 mg | ORAL_TABLET | Freq: Once | ORAL | Status: AC | PRN
  Administered 2021-02-12: 4 mg via ORAL
  Filled 2021-02-12: qty 1

## 2021-02-12 MED ORDER — ONDANSETRON HCL 4 MG/2ML IJ SOLN
4.0000 mg | Freq: Once | INTRAMUSCULAR | Status: AC
  Administered 2021-02-12: 4 mg via INTRAVENOUS
  Filled 2021-02-12: qty 2

## 2021-02-12 NOTE — ED Triage Notes (Signed)
Pt in via POV, reports being sent over from Urgent Care for concerns for dehydration.  Pt reports N/V/D x 2 days, unable to keep down sips of water.  Also reports headache.  Ambulatory to triage, vitals WDL, NAD noted at this time.

## 2021-02-12 NOTE — ED Provider Notes (Signed)
[facility] Emergency Department Provider Note  ____________________________________________   Event Date/Time   First MD Initiated Contact with Patient 02/12/21 2331     (approximate)  I have reviewed the triage vital signs and the nursing notes.   HISTORY  Chief Complaint Dehydration    HPI Breanna Mathews is a 35 y.o. female with history of chronic back pain on buprenorphine -naloxone who presents to the emergency department with opiate withdrawals.  States she was just in [place] visiting her friend and she is not sure if she lost her prescription or if someone stole it but states she has been out of her medications for 6 days.  She contacted her pain management specialist who told her they could not refill her medication until it was due again on the 16th.  She states she is having headaches, chills, diaphoresis, vomiting, diarrhea and her back has been hurting more than normal.  She states her back pain is chronic.  No fevers, chest pain, shortness of breath, abdominal pain.  She states she is taking Tylenol and Phenergan at home without relief.  States she was seen at urgent care tonight and sent here because she "looked dehydrated".       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   Spell of altered consciousness 05/15/2015   Dependence on nicotine from cigarettes  04/10/2015   History of hypertension 04/10/2015   Intentional fentanyl overdose (HCC) 04/10/2015   Sinus tachycardia 04/10/2015   Suicidal ideation 04/10/2015   Chronic pain disorder 04/09/2015   Dissociative episodes 04/09/2015   Mood disorder (HCC) 04/09/2015   Opioid use disorder, severe, dependence (HCC) 04/09/2015   Chronic bilateral low back pain with bilateral sciatica 03/08/2015   Borderline personality disorder (HCC) 01/16/2015   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) 06/22/2014   Uncomplicated opioid dependence (HCC) 06/22/2014   Essential (primary) hypertension 03/17/2013   Migraine with aura and without status migrainosus, not intractable 03/17/2013   Other specified anxiety disorders 03/17/2013   Spondylosis of cervical region without myelopathy or radiculopathy 07/11/2012   Spondylosis of thoracic region without myelopathy or radiculopathy 07/11/2012   Cannabis abuse, uncomplicated 11/27/2011    Past Surgical History:  Procedure Laterality Date   APPENDECTOMY     BACK SURGERY     CESAREAN SECTION     KNEE ARTHROPLASTY Left    x 2   POLYPECTOMY     TONSILLECTOMY      Prior to Admission medications   Medication Sig Start Date End Date Taking? Authorizing Provider  ondansetron (ZOFRAN ODT) 4 MG disintegrating tablet Take 1 tablet (4 mg total) by mouth every 6 (six) hours as needed for nausea or vomiting. 02/13/21  Yes Acquanetta Cabanilla, Layla Maw, DO  acetaminophen (TYLENOL) 500 MG tablet Take 1,000 mg by mouth 3 (three) times daily.  [provider]  aspirin 81 MG chewable tablet Chew 81 mg by mouth 2 (two) times daily.    [provider]  buprenorphine (SUBUTEX) 8 MG SUBL SL tablet Place 4 mg under the tongue 3 (three) times daily. 11/02/16   [provider]  diazepam (VALIUM) 5 MG tablet Take 1 tablet (5 mg total) by mouth every 8 (eight) hours as needed for anxiety. 07/07/20   Irean Hong, MD  diphenhydrAMINE (BENADRYL)  50 MG capsule Take 50 mg by mouth 3 (three) times daily.    [provider]  FLUoxetine (PROZAC) 20 MG capsule Take 1 capsule (20 mg total) by mouth daily. 08/03/20 08/03/21  Phineas Semen, MD  labetalol (NORMODYNE) 100 MG tablet Take 2 tablets (200 mg total) by mouth 2 (two) times daily. 12/20/16   Willy Eddy, MD  lamoTRIgine (LAMICTAL) 25 MG tablet Take 25 mg by mouth 2 (two) times daily.    [provider]  pyridOXINE (VITAMIN B-6) 50 MG tablet Take 1 tablet (50 mg total) by mouth 3 (three) times daily. 11/17/16   Finnis Colee, Elenora Fender, MD  sertraline (ZOLOFT) 50 MG tablet Take 150 mg by mouth daily. 09/07/16   [provider]    Allergies Keflex [cephalexin], Lithium, Opana [oxymorphone hcl], and Oxymorphone  No family history on file.  Social History Social History   Tobacco Use   Smoking status: Every Day    Packs/day: 0.50    Types: Cigarettes   Smokeless tobacco: Never   Tobacco comments:    2 cigarettes/day  Vaping Use   Vaping Use: Never used  Substance Use Topics   Alcohol use: No   Drug use: No    Review of Systems Constitutional: No fever. Eyes: No visual changes. ENT: No sore throat. Cardiovascular: Denies chest pain. Respiratory: Denies shortness of breath. Gastrointestinal: + nausea, vomiting, diarrhea. Genitourinary: Negative for dysuria. Musculoskeletal: +  for back pain. Skin: Negative for rash. Neurological: Negative for focal weakness or numbness.  ____________________________________________   PHYSICAL EXAM:  VITAL SIGNS: ED Triage Vitals  Enc Vitals Group     BP 02/12/21 1954 133/86     Pulse Rate 02/12/21 1954 89     Resp 02/12/21 1954 16     Temp 02/12/21 1954 98.9 F (37.2 C)     Temp Source 02/12/21 1954 Oral     SpO2 02/12/21 1954 97 %     Weight 02/12/21 1956 230 lb (104.3 kg)     Height 02/12/21 1956 5\' 9"  (1.753 m)     Head Circumference --      Peak Flow --      Pain Score 02/12/21 1955 5     Pain  Loc --      Pain Edu? --      Excl. in GC? --    CONSTITUTIONAL: Alert and oriented and responds appropriately to questions. Well-appearing; well-nourished, obese HEAD: Normocephalic EYES: Conjunctivae clear, pupils appear equal, EOM appear intact ENT: normal nose; moist mucous membranes NECK: Supple, normal ROM CARD: RRR; S1 and S2 appreciated; no murmurs, no clicks, no rubs, no gallops RESP: Normal chest excursion without splinting or tachypnea; breath sounds clear and equal bilaterally; no wheezes, no rhonchi, no rales, no hypoxia or respiratory distress, speaking full sentences ABD/GI: Normal bowel sounds; non-distended; soft, non-tender, no rebound, no guarding, no peritoneal signs, no hepatosplenomegaly BACK: The back appears normal EXT: Normal ROM in all joints; no deformity noted, no edema; no cyanosis SKIN: Normal color for age  and race; warm; no rash on exposed skin NEURO: Moves all extremities equally, normal speech, no tremors PSYCH: The patient's mood and manner are appropriate.  ____________________________________________   LABS (all labs ordered are listed, but only abnormal results are displayed)  Labs Reviewed  COMPREHENSIVE METABOLIC PANEL - Abnormal; Notable for the following components:      Result Value   Sodium 131 (*)    CO2 21 (*)    Glucose, Bld 179 (*)    Total Protein 8.5 (*)    All other components within normal limits  CBC - Abnormal; Notable for the following components:   WBC 16.2 (*)    RBC 5.51 (*)    Hemoglobin 16.8 (*)    HCT 48.4 (*)    All other components within normal limits  URINALYSIS, COMPLETE (UACMP) WITH MICROSCOPIC - Abnormal; Notable for the following components:   Color, Urine YELLOW (*)    APPearance CLOUDY (*)    Bacteria, UA RARE (*)    All other components within normal limits  LIPASE, BLOOD  POC URINE PREG, ED    ____________________________________________  EKG   ____________________________________________  RADIOLOGY I, Saw Mendenhall, personally viewed and evaluated these images (plain radiographs) as part of my medical decision making, as well as reviewing the written report by the radiologist.  ED MD interpretation:    Official radiology report(s): No results found.  ____________________________________________   PROCEDURES  Procedure(s) performed (including Critical Care):  Procedures    ____________________________________________   INITIAL IMPRESSION / ASSESSMENT AND PLAN / ED COURSE  As part of my medical decision making, I reviewed the following data within the electronic MEDICAL RECORD NUMBER Nursing notes reviewed and incorporated, Labs reviewed , Old chart reviewed, Notes from prior ED visits, and [place] Controlled Substance Database         Patient here with symptoms of opiate withdrawal.  Abdominal exam benign.  She is overall well-appearing with normal vital signs.  She does not appear dehydrated on exam.  Labs obtained from triage show leukocytosis which I suspect is reactive.  Normal renal function, electrolytes.  No ketones in her urine.  Pregnancy test negative.  Patient asking for opiate pain medication here in the emergency department and something to go home with.  Explained to patient that we cannot refill her chronic narcotic pain medication from the ED and that she will have to discuss this with her pain management specialist.  We will treat symptoms with IV fluids, Zofran, Toradol, Imodium and reassess.  Abdominal exam benign.  Doubt appendicitis, colitis, diverticulitis, pancreatitis, cholecystitis, pyelonephritis, kidney stone, bowel obstruction.  ED PROGRESS  Patient requesting discharge home as she states her husband is here to pick her up.  She has not had any vomiting or diarrhea since being in the room.  Tolerating p.o.  Will discharge with prescription for  Zofran.  I did discuss the patient that even though opiate withdrawal feels bad it is not something that is life-threatening to her given she is on a pain contract I cannot discharge her home with any narcotics from the ED.  She will need to have a conversation with her pain management specialist.  At this time, I do not feel there is any life-threatening condition present. I have reviewed, interpreted and discussed all results (EKG, imaging, lab, urine as appropriate) and exam findings with patient/family. I have reviewed nursing notes and appropriate previous records.  I feel the patient is safe to be discharged home without further emergent workup and  can continue workup as an outpatient as needed. Discussed usual and customary return precautions. Patient/family verbalize understanding and are comfortable with this plan.  Outpatient follow-up has been provided as needed. All questions have been answered.  ____________________________________________   FINAL CLINICAL IMPRESSION(S) / ED DIAGNOSES  Final diagnoses:  Opiate withdrawal [facility])     ED Discharge Orders          Ordered    ondansetron (ZOFRAN ODT) 4 MG disintegrating tablet  Every 6 hours PRN        02/13/21 0018            *Please note:  Trease Mena was evaluated in Emergency Department on 02/13/2021 for the symptoms described in the history of present illness. She was evaluated in the context of the global COVID-19 pandemic, which necessitated consideration that the patient might be at risk for infection with the SARS-CoV-2 virus that causes COVID-19. Institutional protocols and algorithms that pertain to the evaluation of patients at risk for COVID-19 are in a state of rapid change based on information released by regulatory bodies including the CDC and federal and state organizations. These policies and algorithms were followed during the patient's care in the ED.  Some ED evaluations and interventions may be delayed as a  result of limited staffing during and the pandemic.*   Note:  This document was prepared using Dragon voice recognition software and may include unintentional dictation errors.    Kelechi Astarita, Layla Maw, DO 02/13/21 0020

## 2021-02-13 MED ORDER — ONDANSETRON 4 MG PO TBDP: 4.0000 mg | ORAL_TABLET | Freq: Four times a day (QID) | ORAL | 0 refills | Status: DC | PRN

## 2021-05-07 ENCOUNTER — Inpatient Hospital Stay
Admission: EM | Admit: 2021-05-07 | Discharge: 2021-05-11 | DRG: 439 | Disposition: A | Discharge status: Home / Self Care | Payer: 59 | Attending: Internal Medicine | Admitting: Internal Medicine

## 2021-05-07 ENCOUNTER — Inpatient Hospital Stay: Payer: 59

## 2021-05-07 ENCOUNTER — Emergency Department: Payer: 59

## 2021-05-07 ENCOUNTER — Encounter: Payer: Self-pay | Admitting: Emergency Medicine

## 2021-05-07 DIAGNOSIS — F419 Anxiety disorder, unspecified: Secondary | ICD-10-CM | POA: Diagnosis present

## 2021-05-07 DIAGNOSIS — M549 Dorsalgia, unspecified: Secondary | ICD-10-CM | POA: Diagnosis present

## 2021-05-07 DIAGNOSIS — K8020 Calculus of gallbladder without cholecystitis without obstruction: Secondary | ICD-10-CM | POA: Diagnosis present

## 2021-05-07 DIAGNOSIS — Z7982 Long term (current) use of aspirin: Secondary | ICD-10-CM

## 2021-05-07 DIAGNOSIS — Z96652 Presence of left artificial knee joint: Secondary | ICD-10-CM | POA: Diagnosis present

## 2021-05-07 DIAGNOSIS — E119 Type 2 diabetes mellitus without complications: Secondary | ICD-10-CM | POA: Diagnosis present

## 2021-05-07 DIAGNOSIS — F32A Depression, unspecified: Secondary | ICD-10-CM | POA: Diagnosis present

## 2021-05-07 DIAGNOSIS — F1120 Opioid dependence, uncomplicated: Secondary | ICD-10-CM | POA: Diagnosis present

## 2021-05-07 DIAGNOSIS — I10 Essential (primary) hypertension: Secondary | ICD-10-CM | POA: Diagnosis present

## 2021-05-07 DIAGNOSIS — Z888 Allergy status to other drugs, medicaments and biological substances status: Secondary | ICD-10-CM | POA: Diagnosis not present

## 2021-05-07 DIAGNOSIS — K859 Acute pancreatitis without necrosis or infection, unspecified: Principal | ICD-10-CM | POA: Diagnosis present

## 2021-05-07 DIAGNOSIS — Z7984 Long term (current) use of oral hypoglycemic drugs: Secondary | ICD-10-CM | POA: Diagnosis not present

## 2021-05-07 DIAGNOSIS — Z79899 Other long term (current) drug therapy: Secondary | ICD-10-CM | POA: Diagnosis not present

## 2021-05-07 DIAGNOSIS — Z20822 Contact with and (suspected) exposure to covid-19: Secondary | ICD-10-CM | POA: Diagnosis present

## 2021-05-07 DIAGNOSIS — G8929 Other chronic pain: Secondary | ICD-10-CM | POA: Diagnosis present

## 2021-05-07 DIAGNOSIS — K219 Gastro-esophageal reflux disease without esophagitis: Secondary | ICD-10-CM | POA: Diagnosis present

## 2021-05-07 DIAGNOSIS — R1011 Right upper quadrant pain: Secondary | ICD-10-CM

## 2021-05-07 DIAGNOSIS — F17210 Nicotine dependence, cigarettes, uncomplicated: Secondary | ICD-10-CM | POA: Diagnosis present

## 2021-05-07 DIAGNOSIS — R1013 Epigastric pain: Secondary | ICD-10-CM

## 2021-05-07 LAB — CBC
HCT: 42.8 % (ref 36.0–46.0)
Hemoglobin: 15.4 g/dL — ABNORMAL HIGH (ref 12.0–15.0)
MCH: 32.5 pg (ref 26.0–34.0)
MCHC: 36 g/dL (ref 30.0–36.0)
MCV: 90.3 fL (ref 80.0–100.0)
Platelets: 453 10*3/uL — ABNORMAL HIGH (ref 150–400)
RBC: 4.74 MIL/uL (ref 3.87–5.11)
RDW: 14.7 % (ref 11.5–15.5)
WBC: 19.6 10*3/uL — ABNORMAL HIGH (ref 4.0–10.5)
nRBC: 0 % (ref 0.0–0.2)

## 2021-05-07 LAB — RESP PANEL BY RT-PCR (FLU A&B, COVID) ARPGX2
Influenza A by PCR: NEGATIVE
Influenza B by PCR: NEGATIVE
SARS Coronavirus 2 by RT PCR: NEGATIVE

## 2021-05-07 LAB — URINALYSIS, ROUTINE W REFLEX MICROSCOPIC
Bacteria, UA: NONE SEEN
Bilirubin Urine: NEGATIVE
Glucose, UA: 150 mg/dL — AB
Hgb urine dipstick: NEGATIVE
Ketones, ur: 5 mg/dL — AB
Leukocytes,Ua: NEGATIVE
Nitrite: NEGATIVE
Protein, ur: 30 mg/dL — AB
Specific Gravity, Urine: 1.028 (ref 1.005–1.030)
pH: 5 (ref 5.0–8.0)

## 2021-05-07 LAB — COMPREHENSIVE METABOLIC PANEL
ALT: 19 U/L (ref 0–44)
AST: 19 U/L (ref 15–41)
Albumin: 4.4 g/dL (ref 3.5–5.0)
Alkaline Phosphatase: 64 U/L (ref 38–126)
Anion gap: 11 (ref 5–15)
BUN: 10 mg/dL (ref 6–20)
CO2: 18 mmol/L — ABNORMAL LOW (ref 22–32)
Calcium: 8.7 mg/dL — ABNORMAL LOW (ref 8.9–10.3)
Chloride: 103 mmol/L (ref 98–111)
Creatinine, Ser: 0.46 mg/dL (ref 0.44–1.00)
GFR, Estimated: >60 mL/min (ref >60)
Glucose, Bld: 225 mg/dL — ABNORMAL HIGH (ref 70–99)
Potassium: 3.7 mmol/L (ref 3.5–5.1)
Sodium: 132 mmol/L — ABNORMAL LOW (ref 135–145)
Total Bilirubin: 0.7 mg/dL (ref 0.3–1.2)
Total Protein: 8.1 g/dL (ref 6.5–8.1)

## 2021-05-07 LAB — TROPONIN I (HIGH SENSITIVITY)
Troponin I (High Sensitivity): 5 ng/L (ref <18)
Troponin I (High Sensitivity): 3 ng/L (ref <18)

## 2021-05-07 LAB — LIPASE, BLOOD: Lipase: 249 U/L — ABNORMAL HIGH (ref 11–51)

## 2021-05-07 LAB — POC URINE PREG, ED: Preg Test, Ur: NEGATIVE

## 2021-05-07 MED ORDER — ENOXAPARIN SODIUM 40 MG/0.4ML IJ SOSY: 40.0000 mg | PREFILLED_SYRINGE | INTRAMUSCULAR | Status: DC

## 2021-05-07 MED ORDER — ENOXAPARIN SODIUM 60 MG/0.6ML IJ SOSY
0.5000 mg/kg | PREFILLED_SYRINGE | INTRAMUSCULAR | Status: DC
  Administered 2021-05-07 – 2021-05-10 (×4): 55 mg via SUBCUTANEOUS
  Filled 2021-05-07 (×4): qty 0.6

## 2021-05-07 MED ORDER — ONDANSETRON HCL 4 MG/2ML IJ SOLN
4.0000 mg | Freq: Once | INTRAMUSCULAR | Status: AC
  Administered 2021-05-07: 4 mg via INTRAVENOUS
  Filled 2021-05-07: qty 2

## 2021-05-07 MED ORDER — CARISOPRODOL 350 MG PO TABS
350.0000 mg | ORAL_TABLET | Freq: Three times a day (TID) | ORAL | Status: DC | PRN
  Filled 2021-05-07 (×3): qty 1

## 2021-05-07 MED ORDER — HYDROCHLOROTHIAZIDE 25 MG PO TABS
25.0000 mg | ORAL_TABLET | Freq: Every day | ORAL | Status: DC
  Administered 2021-05-07 – 2021-05-11 (×5): 25 mg via ORAL
  Filled 2021-05-07 (×5): qty 1

## 2021-05-07 MED ORDER — ARIPIPRAZOLE 2 MG PO TABS
2.0000 mg | ORAL_TABLET | Freq: Every day | ORAL | Status: DC
  Administered 2021-05-07 – 2021-05-11 (×5): 2 mg via ORAL
  Filled 2021-05-07 (×6): qty 1

## 2021-05-07 MED ORDER — TRAZODONE HCL 50 MG PO TABS
25.0000 mg | ORAL_TABLET | Freq: Every evening | ORAL | Status: DC | PRN
  Administered 2021-05-08 (×2): 25 mg via ORAL
  Filled 2021-05-07 (×2): qty 1

## 2021-05-07 MED ORDER — HYDROMORPHONE HCL 1 MG/ML IJ SOLN: 1.0000 mg | INTRAMUSCULAR | Status: DC | PRN

## 2021-05-07 MED ORDER — LACTATED RINGERS IV SOLN: INTRAVENOUS | Status: DC

## 2021-05-07 MED ORDER — ONDANSETRON HCL 4 MG/2ML IJ SOLN
4.0000 mg | Freq: Four times a day (QID) | INTRAMUSCULAR | Status: DC | PRN
  Administered 2021-05-07 – 2021-05-09 (×3): 4 mg via INTRAVENOUS
  Filled 2021-05-07 (×3): qty 2

## 2021-05-07 MED ORDER — HYDRALAZINE HCL 20 MG/ML IJ SOLN: 10.0000 mg | INTRAMUSCULAR | Status: DC | PRN

## 2021-05-07 MED ORDER — OLMESARTAN MEDOXOMIL-HCTZ 40-25 MG PO TABS: 1.0000 | ORAL_TABLET | Freq: Every day | ORAL | Status: DC

## 2021-05-07 MED ORDER — BUPRENORPHINE HCL 2 MG SL SUBL
4.0000 mg | SUBLINGUAL_TABLET | Freq: Three times a day (TID) | SUBLINGUAL | Status: DC
  Administered 2021-05-07 – 2021-05-08 (×2): 4 mg via SUBLINGUAL
  Filled 2021-05-07: qty 1
  Filled 2021-05-07: qty 2

## 2021-05-07 MED ORDER — IRBESARTAN 150 MG PO TABS
300.0000 mg | ORAL_TABLET | Freq: Every day | ORAL | Status: DC
  Administered 2021-05-07 – 2021-05-11 (×5): 300 mg via ORAL
  Filled 2021-05-07 (×6): qty 2

## 2021-05-07 MED ORDER — ASPIRIN 81 MG PO CHEW
81.0000 mg | CHEWABLE_TABLET | Freq: Two times a day (BID) | ORAL | Status: DC
  Administered 2021-05-07 – 2021-05-11 (×9): 81 mg via ORAL
  Filled 2021-05-07 (×9): qty 1

## 2021-05-07 MED ORDER — BUPRENORPHINE HCL-NALOXONE HCL 8-2 MG SL SUBL
2.0000 | SUBLINGUAL_TABLET | Freq: Every day | SUBLINGUAL | Status: DC
  Administered 2021-05-07: 2 via SUBLINGUAL
  Filled 2021-05-07: qty 2

## 2021-05-07 MED ORDER — ALBUTEROL SULFATE (2.5 MG/3ML) 0.083% IN NEBU: 2.5000 mg | INHALATION_SOLUTION | RESPIRATORY_TRACT | Status: DC | PRN

## 2021-05-07 MED ORDER — VITAMIN B-6 50 MG PO TABS
50.0000 mg | ORAL_TABLET | Freq: Three times a day (TID) | ORAL | Status: DC
  Administered 2021-05-07 (×2): 50 mg via ORAL
  Filled 2021-05-07 (×3): qty 1

## 2021-05-07 MED ORDER — LABETALOL HCL 200 MG PO TABS
200.0000 mg | ORAL_TABLET | Freq: Two times a day (BID) | ORAL | Status: DC
  Administered 2021-05-07: 200 mg via ORAL
  Filled 2021-05-07 (×3): qty 1

## 2021-05-07 MED ORDER — HYDROMORPHONE HCL 1 MG/ML IJ SOLN
1.5000 mg | INTRAMUSCULAR | Status: DC | PRN
  Administered 2021-05-07 – 2021-05-09 (×11): 1.5 mg via INTRAVENOUS
  Filled 2021-05-07 (×2): qty 1.5
  Filled 2021-05-07: qty 2
  Filled 2021-05-07: qty 1.5
  Filled 2021-05-07: qty 2
  Filled 2021-05-07: qty 1.5
  Filled 2021-05-07: qty 2
  Filled 2021-05-07 (×4): qty 1.5

## 2021-05-07 MED ORDER — OXYCODONE HCL 5 MG PO TABS
5.0000 mg | ORAL_TABLET | ORAL | Status: DC | PRN
  Administered 2021-05-07 – 2021-05-10 (×10): 5 mg via ORAL
  Filled 2021-05-07 (×10): qty 1

## 2021-05-07 MED ORDER — ACETAMINOPHEN 325 MG PO TABS: 650.0000 mg | ORAL_TABLET | Freq: Four times a day (QID) | ORAL | Status: DC | PRN

## 2021-05-07 MED ORDER — SERTRALINE HCL 50 MG PO TABS
150.0000 mg | ORAL_TABLET | Freq: Every day | ORAL | Status: DC
  Administered 2021-05-07: 150 mg via ORAL
  Filled 2021-05-07: qty 3

## 2021-05-07 MED ORDER — HYDROMORPHONE HCL 1 MG/ML IJ SOLN
1.5000 mg | INTRAMUSCULAR | Status: DC | PRN
  Administered 2021-05-07 (×2): 1.5 mg via INTRAVENOUS
  Filled 2021-05-07 (×3): qty 2

## 2021-05-07 MED ORDER — HYDROMORPHONE HCL 1 MG/ML IJ SOLN
1.0000 mg | Freq: Once | INTRAMUSCULAR | Status: AC
  Administered 2021-05-07: 1 mg via INTRAVENOUS
  Filled 2021-05-07: qty 1

## 2021-05-07 MED ORDER — FLUOXETINE HCL 20 MG PO CAPS
20.0000 mg | ORAL_CAPSULE | Freq: Every day | ORAL | Status: DC
  Administered 2021-05-07 – 2021-05-11 (×5): 20 mg via ORAL
  Filled 2021-05-07 (×5): qty 1

## 2021-05-07 MED ORDER — ACETAMINOPHEN 650 MG RE SUPP
650.0000 mg | Freq: Four times a day (QID) | RECTAL | Status: DC | PRN
  Filled 2021-05-07: qty 1

## 2021-05-07 MED ORDER — DIPHENHYDRAMINE HCL 25 MG PO CAPS
50.0000 mg | ORAL_CAPSULE | Freq: Three times a day (TID) | ORAL | Status: DC
  Administered 2021-05-07 – 2021-05-11 (×11): 50 mg via ORAL
  Filled 2021-05-07 (×11): qty 2

## 2021-05-07 MED ORDER — GABAPENTIN 600 MG PO TABS
300.0000 mg | ORAL_TABLET | Freq: Three times a day (TID) | ORAL | Status: DC
  Filled 2021-05-07: qty 1

## 2021-05-07 MED ORDER — SODIUM CHLORIDE 0.9 % IV BOLUS
1000.0000 mL | Freq: Once | INTRAVENOUS | Status: AC
  Administered 2021-05-07: 1000 mL via INTRAVENOUS

## 2021-05-07 MED ORDER — BUPRENORPHINE HCL-NALOXONE HCL 8-2 MG SL SUBL: 1.0000 | SUBLINGUAL_TABLET | Freq: Every day | SUBLINGUAL | Status: DC

## 2021-05-07 MED ORDER — PROMETHAZINE HCL 25 MG PO TABS
12.5000 mg | ORAL_TABLET | Freq: Four times a day (QID) | ORAL | Status: DC | PRN
  Administered 2021-05-07 – 2021-05-11 (×3): 12.5 mg via ORAL
  Filled 2021-05-07 (×4): qty 1

## 2021-05-07 NOTE — ED Notes (Signed)
Report received from Nate, RN 

## 2021-05-07 NOTE — H&P (Signed)
History and Physical    Breanna Mathews QJJ:[id number] DOB: 03/27/1986 DOA: 05/07/2021  PCP: Lyn Records, MD  Patient coming from: Home  I have personally briefly reviewed patient's old medical records in [facility]  Chief Complaint: Abdominal pain.  Intractable nausea and vomiting  HPI: Breanna Mathews is a 35 y.o. female with medical history significant of chronic pain on Subutex, back pain, anxiety, GERD, hypertension, mitral valve prolapse who presents for evaluation of abdominal pain associated with intractable nausea and vomiting.  Patient presented initially to the emergency room on the day prior to presentation.  At that time she was told she has gallstones and given outpatient follow-up with general surgery.  She presents back the day after stating worsening abdominal pain.  Laboratory investigation significant for elevated lipase.  Abdominal ultrasound performed in ED demonstrates cholelithiasis without evidence of acute cholecystitis.  Patient has significant amount of abdominal pain.  Has not tried to eat anything.  Hemodynamically stable.  CT imaging survey consistent with acute pancreatitis.  ED Course: Abdominal ultrasound positive for gallstones, negative for: Cystitis.  Patient given IV fluids and narcotics.  Hospitalist then contacted for admission once lipase found to be elevated.  Review of Systems: As per HPI otherwise 14 point review of systems negative.    Past Medical History:  Diagnosis Date   Angina of effort (HCC)    Anxiety    Bipolar disorder with depression (HCC)    Chronic back pain    GERD (gastroesophageal reflux disease)    Hypertension    Mitral valve prolapse     Past Surgical History:  Procedure Laterality Date   APPENDECTOMY     BACK SURGERY     CESAREAN SECTION     KNEE ARTHROPLASTY Left    x 2   POLYPECTOMY     TONSILLECTOMY       reports that she has been smoking cigarettes. She has been smoking an average of .5 packs per  day. She has never used smokeless tobacco. She reports that she does not drink alcohol and does not use drugs.  Allergies  Allergen Reactions   Keflex [Cephalexin] Rash   Lithium Palpitations    Seizures    Opana [Oxymorphone Hcl] Palpitations   Oxymorphone Other (See Comments)    History reviewed. No pertinent family history. No family history of acute pancreatitis  Prior to Admission medications   Medication Sig Start Date End Date Taking? Authorizing Provider  acetaminophen (TYLENOL) 500 MG tablet Take 1,000 mg by mouth 3 (three) times daily.    [provider]  aspirin 81 MG chewable tablet Chew 81 mg by mouth 2 (two) times daily.    [provider]  buprenorphine (SUBUTEX) 8 MG SUBL SL tablet Place 4 mg under the tongue 3 (three) times daily. 11/02/16   [provider]  diazepam (VALIUM) 5 MG tablet Take 1 tablet (5 mg total) by mouth every 8 (eight) hours as needed for anxiety. 07/07/20   Irean Hong, MD  diphenhydrAMINE (BENADRYL) 50 MG capsule Take 50 mg by mouth 3 (three) times daily.    [provider]  FLUoxetine (PROZAC) 20 MG capsule Take 1 capsule (20 mg total) by mouth daily. 08/03/20 08/03/21  Phineas Semen, MD  labetalol (NORMODYNE) 100 MG tablet Take 2 tablets (200 mg total) by mouth 2 (two) times daily. 12/20/16   Willy Eddy, MD  lamoTRIgine (LAMICTAL) 25 MG tablet Take 25 mg by mouth 2 (two) times daily.    [provider]  ondansetron (ZOFRAN ODT) 4 MG disintegrating tablet Take 1 tablet (4 mg total) by mouth every 6 (six) hours as needed for nausea or vomiting. 02/13/21   Ward, Layla Maw, DO  pyridOXINE (VITAMIN B-6) 50 MG tablet Take 1 tablet (50 mg total) by mouth 3 (three) times daily. 11/17/16   Ward, Elenora Fender, MD  sertraline (ZOLOFT) 50 MG tablet Take 150 mg by mouth daily. 09/07/16   [provider]    Physical Exam: Vitals:   05/07/21 0725 05/07/21 0957 05/07/21 1118  BP: (!) 143/98 (!) 150/114    Pulse: 93 90   Resp: 18 20   Temp: 97.7 F (36.5 C)    TempSrc: Oral    SpO2: 97% 100%   Weight:   108.9 kg     Vitals:   05/07/21 0725 05/07/21 0957 05/07/21 1118  BP: (!) 143/98 (!) 150/114   Pulse: 93 90   Resp: 18 20   Temp: 97.7 F (36.5 C)    TempSrc: Oral    SpO2: 97% 100%   Weight:   108.9 kg  Constitutional: Appears in distress due to abdominal pain ENMT: Mucous membranes dry.  Normal dentition Respiratory: Lungs clear.  Normal work of breathing.  Room air Cardiovascular: Tachycardic, regular rhythm, no murmurs, no pedal edema Abdomen: Obese, NT/ND, normal bowel sounds Musculoskeletal: No clubbing or cyanosis.  No obvious joint deformity.  Normal muscle tone Skin: no rashes, lesions, ulcers. No induration Neurologic: Cranial nerves grossly intact.  Sensation intact.  Strength 5/5 Psychiatric: Normal judgment and insight. Alert and oriented x 3. Normal mood.   Labs on Admission: I have personally reviewed following labs and imaging studies  CBC: Recent Labs  Lab 05/07/21 0713  WBC 19.6*  HGB 15.4*  HCT 42.8  MCV 90.3  PLT 453*   Basic Metabolic Panel: Recent Labs  Lab 05/07/21 0713  NA 132*  K 3.7  CL 103  CO2 18*  GLUCOSE 225*  BUN 10  CREATININE 0.46  CALCIUM 8.7*   GFR: Estimated Creatinine Clearance: 126.6 mL/min (by C-G formula based on SCr of 0.46 mg/dL). Liver Function Tests: Recent Labs  Lab 05/07/21 0713  AST 19  ALT 19  ALKPHOS 64  BILITOT 0.7  PROT 8.1  ALBUMIN 4.4   Recent Labs  Lab 05/07/21 0713  LIPASE 249*   No results for input(s): AMMONIA in the last 168 hours. Coagulation Profile: No results for input(s): INR, PROTIME in the last 168 hours. Cardiac Enzymes: No results for input(s): CKTOTAL, CKMB, CKMBINDEX, TROPONINI in the last 168 hours. BNP (last 3 results) No results for input(s): PROBNP in the last 8760 hours. HbA1C: No results for input(s): HGBA1C in the last 72 hours. CBG: No results for input(s):  GLUCAP in the last 168 hours. Lipid Profile: No results for input(s): CHOL, HDL, LDLCALC, TRIG, CHOLHDL, LDLDIRECT in the last 72 hours. Thyroid Function Tests: No results for input(s): TSH, T4TOTAL, FREET4, T3FREE, THYROIDAB in the last 72 hours. Anemia Panel: No results for input(s): VITAMINB12, FOLATE, FERRITIN, TIBC, IRON, RETICCTPCT in the last 72 hours. Urine analysis:    Component Value Date/Time   COLORURINE YELLOW (A) 05/07/2021 0957   APPEARANCEUR HAZY (A) 05/07/2021 0957   LABSPEC 1.028 05/07/2021 0957   PHURINE 5.0 05/07/2021 0957   GLUCOSEU 150 (A) 05/07/2021 0957   HGBUR NEGATIVE 05/07/2021 0957   BILIRUBINUR NEGATIVE 05/07/2021 0957   KETONESUR 5 (A) 05/07/2021 0957   PROTEINUR 30 (A) 05/07/2021 0957   NITRITE  NEGATIVE 05/07/2021 0957   LEUKOCYTESUR NEGATIVE 05/07/2021 0957    Radiological Exams on Admission: US Abdomen Limited RUQ (LIVER/GB)  Result Date: 05/07/2021 CLINICAL DATA:  Right upper quadrant/epigastric pain. EXAM: ULTRASOUND ABDOMEN LIMITED RIGHT UPPER QUADRANT COMPARISON:  Ultrasounds 01/09/2017 FINDINGS: Gallbladder: Multiple echogenic shadowing gallstones are noted. No gallbladder wall thickening, pericholecystic fluid or sonographic Murphy sign to suggest acute cholecystitis. The largest calculus measures 9 mm. Common bile duct: Diameter: 4.3 mm Liver: Diffuse increased echogenicity of the liver suggesting fatty infiltration. No hepatic lesions or intrahepatic biliary dilatation. Portal vein is patent on color Doppler imaging with normal direction of blood flow towards the liver. Other: None. IMPRESSION: 1. Cholelithiasis without sonographic findings for acute cholecystitis. 2. No intra or extrahepatic biliary dilatation. 3. Diffuse increased echogenicity of the liver suggesting fatty infiltration. Electronically Signed   By: Rudie Meyer M.D.   On: 05/07/2021 10:41    EKG: Independently reviewed.  Normal sinus rhythm Assessment/Plan Active Problems:    Pancreatitis   Acute pancreatitis Cholelithiasis CT imaging survey consistent with above diagnosis Likely secondary to gallstones No history of alcohol intake No recent changes in medication No other obvious inciting factors Plan: Admit inpatient Aggressive IV fluids Multimodal pain control Antiemetics as needed Trend lipase As clinical pancreatitis starts to resolve we will reach out to general surgery for consideration of cholecystectomy  Chronic pain Patient on outpatient pain contract Takes Subutex 3 times daily Will need to verify this through pharmacy Plan: Home Subutex on hold for now Will discuss with pharmacy  Anxiety/depression Resume home regimen including Abilify 2 mg daily Soma 350 mg 3 times daily as needed Prozac 20 mg daily Zoloft 150 mg daily  History of type 2 diabetes mellitus Home metformin on hold  Essential hypertension Resume home labetalol    DVT prophylaxis: SQ Lovenox Code Status: Full Family Communication: None today Disposition Plan: Anticipate return to previous home environment to 3 days Consults called: None Admission status: Inpatient, MedSurg   Tresa Moore MD [facility]  If 7PM-7AM, please contact night-coverage   05/07/2021, 11:31 AM

## 2021-05-07 NOTE — ED Provider Notes (Signed)
[facility] Emergency Department Provider Note   ____________________________________________   Event Date/Time   First MD Initiated Contact with Patient 05/07/21 [phone]     (approximate)  I have reviewed the triage vital signs and the nursing notes.   HISTORY  Chief Complaint Abdominal Pain    HPI Breanna Mathews is a 35 y.o. female with below stated past medical history presents for midepigastric abdominal pain with associated nausea  LOCATION: Mid epigastric abdomen DURATION: 8 hours prior to arrival TIMING: Stable since onset SEVERITY: 10/10 QUALITY: Burning pain CONTEXT: Patient presents via EMS from home complaining of midepigastric abdominal pain that radiates down the abdomen and into the back.  Patient received 100 mcg of fentanyl and 8 mg of Zofran in route to the emergency department.  Patient states [facility] MODIFYING FACTORS: P.o. intake worsens this pain and nausea.  Denies any relieving factors ASSOCIATED SYMPTOMS: Nausea   Per medical record review, patient has history of anxiety, bipolar disorder, chronic back pain on pain management          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   Spell of altered consciousness 05/15/2015   Dependence on nicotine from cigarettes 04/10/2015   History of  hypertension 04/10/2015   Intentional fentanyl overdose (HCC) 04/10/2015   Sinus tachycardia 04/10/2015   Suicidal ideation 04/10/2015   Chronic pain disorder 04/09/2015   Dissociative episodes 04/09/2015   Mood disorder (HCC) 04/09/2015   Opioid use disorder, severe, dependence (HCC) 04/09/2015   Chronic bilateral low back pain with bilateral sciatica 03/08/2015   Borderline personality disorder (HCC) 01/16/2015   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) 06/22/2014   Uncomplicated opioid dependence (HCC) 06/22/2014   Essential (primary) hypertension 03/17/2013   Migraine with aura and without status migrainosus, not intractable 03/17/2013   Other specified anxiety disorders 03/17/2013   Spondylosis of cervical region without myelopathy or radiculopathy 07/11/2012   Spondylosis of thoracic region without myelopathy or radiculopathy 07/11/2012   Cannabis abuse, uncomplicated 11/27/2011    Past Surgical History:  Procedure Laterality Date   APPENDECTOMY     BACK SURGERY     CESAREAN SECTION     KNEE ARTHROPLASTY Left    x 2   POLYPECTOMY     TONSILLECTOMY      Prior to Admission medications   Medication Sig Start Date End Date Taking? Authorizing Provider  acetaminophen (TYLENOL) 500 MG tablet Take 1,000 mg by mouth 3 (three) times daily.    [provider]  aspirin 81 MG chewable tablet Chew 81 mg by mouth 2 (two) times daily.    [provider]  buprenorphine (SUBUTEX) 8 MG SUBL SL tablet Place 4 mg under the tongue 3 (three) times daily. 11/02/16   [provider]  diazepam (VALIUM) 5 MG tablet Take 1 tablet (5 mg total) by mouth every 8 (eight) hours as needed for anxiety. 07/07/20   Irean Hong, MD  diphenhydrAMINE (BENADRYL) 50 MG capsule Take 50 mg by mouth 3 (three) times daily.    [provider]  FLUoxetine (PROZAC) 20 MG capsule Take 1 capsule (20 mg total) by mouth daily. 08/03/20 08/03/21  Phineas Semen,  MD  labetalol (NORMODYNE) 100 MG tablet Take 2 tablets (200 mg total) by mouth 2 (two) times daily. 12/20/16   Willy Eddy, MD  lamoTRIgine (LAMICTAL) 25 MG tablet Take 25 mg by mouth 2 (two) times daily.    [provider]  ondansetron (ZOFRAN ODT) 4 MG disintegrating tablet Take 1 tablet (4 mg total) by mouth every 6 (six) hours as needed for nausea or vomiting. 02/13/21   Ward, Layla Maw, DO  pyridOXINE (VITAMIN B-6) 50 MG tablet Take 1 tablet (50 mg total) by mouth 3 (three) times daily. 11/17/16   Ward, Elenora Fender, MD  sertraline (ZOLOFT) 50 MG tablet Take 150 mg by mouth daily. 09/07/16   [provider]    Allergies Keflex [cephalexin], Lithium, Opana [oxymorphone hcl], and Oxymorphone  History reviewed. No pertinent family history.  Social History Social History   Tobacco Use   Smoking status: Every Day    Packs/day: 0.50    Types: Cigarettes   Smokeless tobacco: Never   Tobacco comments:    2 cigarettes/day  Vaping Use   Vaping Use: Never used  Substance Use Topics   Alcohol use: No   Drug use: No    Review of Systems Constitutional: No fever/chills Eyes: No visual changes. ENT: No sore throat. Cardiovascular: Denies chest pain. Respiratory: Denies shortness of breath. Gastrointestinal: Endorses midepigastric abdominal pain.  Endorses nausea, no vomiting.  No diarrhea. Genitourinary: Negative for dysuria. Musculoskeletal: Negative for acute arthralgias Skin: Negative for rash. Neurological: Negative for headaches, weakness/numbness/paresthesias in any extremity Psychiatric: Negative for suicidal ideation/homicidal ideation   ____________________________________________   PHYSICAL EXAM:  VITAL SIGNS: ED Triage Vitals  Enc Vitals Group     BP 05/07/21 0725 (!) 143/98     Pulse Rate 05/07/21 0725 93     Resp 05/07/21 0725 18     Temp 05/07/21 0725 97.7 F (36.5 C)     Temp Source 05/07/21 0725 Oral     SpO2 05/07/21 0725 97 %      Weight --      Height --      Head Circumference --      Peak Flow --      Pain Score 05/07/21 1006 9     Pain Loc --      Pain Edu? --      Excl. in GC? --    Constitutional: Alert and oriented. Well appearing and in moderate distress secondary to pain. Eyes: Conjunctivae are normal. PERRL. Head: Atraumatic. Nose: No congestion/rhinnorhea. Mouth/Throat: Mucous membranes are moist. Neck: No stridor Cardiovascular: Grossly normal heart sounds.  Good peripheral circulation. Respiratory: Normal respiratory effort.  No retractions. Gastrointestinal: Soft and tender to palpation in the epigastric and right upper quadrant. No distention. Musculoskeletal: No obvious deformities Neurologic:  Normal speech and language. No gross focal neurologic deficits are appreciated. Skin:  Skin is warm and dry. No rash noted. Psychiatric: Mood and affect are normal. Speech and behavior are normal.  ____________________________________________   LABS (all labs ordered are listed, but only abnormal results are displayed)  Labs Reviewed  LIPASE,  BLOOD - Abnormal; Notable for the following components:      Result Value   Lipase 249 (*)    All other components within normal limits  COMPREHENSIVE METABOLIC PANEL - Abnormal; Notable for the following components:   Sodium 132 (*)    CO2 18 (*)    Glucose, Bld 225 (*)    Calcium 8.7 (*)    All other components within normal limits  CBC - Abnormal; Notable for the following components:   WBC 19.6 (*)    Hemoglobin 15.4 (*)    Platelets 453 (*)    All other components within normal limits  URINALYSIS, ROUTINE W REFLEX MICROSCOPIC - Abnormal; Notable for the following components:   Color, Urine YELLOW (*)    APPearance HAZY (*)    Glucose, UA 150 (*)    Ketones, ur 5 (*)    Protein, ur 30 (*)    All other components within normal limits  POC URINE PREG, ED  TROPONIN I (HIGH SENSITIVITY)  TROPONIN I (HIGH SENSITIVITY)    ____________________________________________  EKG  ED ECG REPORT I, Naaman Plummer, the attending physician, personally viewed and interpreted this ECG.  Date: 05/07/2021 EKG Time: 0720 Rate: 93 Rhythm: normal sinus rhythm QRS Axis: normal Intervals: normal ST/T Wave abnormalities: normal Narrative Interpretation: no evidence of acute ischemia  ____________________________________________  RADIOLOGY  ED MD interpretation: Right upper quadrant ultrasound shows cholelithiasis without sonographic evidence of acute cholecystitis  Official radiology report(s): US Abdomen Limited RUQ (LIVER/GB)  Result Date: 05/07/2021 CLINICAL DATA:  Right upper quadrant/epigastric pain. EXAM: ULTRASOUND ABDOMEN LIMITED RIGHT UPPER QUADRANT COMPARISON:  Ultrasounds 01/09/2017 FINDINGS: Gallbladder: Multiple echogenic shadowing gallstones are noted. No gallbladder wall thickening, pericholecystic fluid or sonographic Murphy sign to suggest acute cholecystitis. The largest calculus measures 9 mm. Common bile duct: Diameter: 4.3 mm Liver: Diffuse increased echogenicity of the liver suggesting fatty infiltration. No hepatic lesions or intrahepatic biliary dilatation. Portal vein is patent on color Doppler imaging with normal direction of blood flow towards the liver. Other: None. IMPRESSION: 1. Cholelithiasis without sonographic findings for acute cholecystitis. 2. No intra or extrahepatic biliary dilatation. 3. Diffuse increased echogenicity of the liver suggesting fatty infiltration. Electronically Signed   By: Marijo Sanes M.D.   On: 05/07/2021 10:41    ____________________________________________   PROCEDURES  Procedure(s) performed (including Critical Care):  .1-3 Lead EKG Interpretation Performed by: Naaman Plummer, MD Authorized by: Naaman Plummer, MD     Interpretation: normal     ECG rate:  89   ECG rate assessment: normal     Rhythm: sinus rhythm     Ectopy: none     Conduction:  normal    CRITICAL CARE Performed by: Naaman Plummer   Total critical care time: 37 minutes  Critical care time was exclusive of separately billable procedures and treating other patients.  Critical care was necessary to treat or prevent imminent or life-threatening deterioration.  Critical care was time spent personally by me on the following activities: development of treatment plan with patient and/or surrogate as well as nursing, discussions with consultants, evaluation of patient's response to treatment, examination of patient, obtaining history from patient or surrogate, ordering and performing treatments and interventions, ordering and review of laboratory studies, ordering and review of radiographic studies, pulse oximetry and re-evaluation of patient's condition.  ____________________________________________   INITIAL IMPRESSION / ASSESSMENT AND PLAN / ED COURSE  As part of my medical decision making, I reviewed the following data within  the electronic medical record, if available:  Nursing notes reviewed and incorporated, Labs reviewed, EKG interpreted, Old chart reviewed, Radiograph reviewed and Notes from prior ED visits reviewed and incorporated        Given history and exam I have a low suspicion for AAA, SBO, appendicitis, mesenteric ischemia, nephrolithiasis, pyelonephritis, or diverticulitis. I have a moderate concern for pancreatitis, gastritis vs non-bleeding peptic ulcer, or hepatobiliary disease and thus will obtain labs and imaging.  ED Workup: CBC, BMP, LFTs, Lipase  No signs of severe pancreatitis on exam such as ecchymosis of periumbilical region or flanks, hypoxia, or tachypneia.  Disposition: Admit for bowel rest, continued analgesia, monitoring.      ____________________________________________   FINAL CLINICAL IMPRESSION(S) / ED DIAGNOSES  Final diagnoses:  RUQ pain     ED Discharge Orders     None        Note:  This document was  prepared using Dragon voice recognition software and may include unintentional dictation errors.    Naaman Plummer, MD 05/07/21 [phone]

## 2021-05-07 NOTE — ED Triage Notes (Signed)
Pt arrived via [facility] from home with c/o upper epigastric pain radiating down abdomen and into back. Pt seen at [facility] on 11/1 and diagnosed with gallstones. Pt received Fentanyl and 8mg  Zofran in route to ED.

## 2021-05-07 NOTE — Progress Notes (Signed)
PHARMACIST - PHYSICIAN COMMUNICATION  CONCERNING:  Enoxaparin (Lovenox) for DVT Prophylaxis    RECOMMENDATION: Patient was prescribed enoxaprin 40mg  q24 hours for VTE prophylaxis.   Filed Weights   05/07/21 1118  Weight: 108.9 kg (240 lb)    Body mass index is 35.44 kg/m.  Estimated Creatinine Clearance: 126.6 mL/min (by C-G formula based on SCr of 0.46 mg/dL).   Based on [facility] policy patient is candidate for enoxaparin 0.5mg /kg TBW SQ every 24 hours based on BMI being >30.   DESCRIPTION: Pharmacy has adjusted enoxaparin dose per [facility] policy.  Patient is now receiving enoxaparin 55 mg every 24 hours    [facility], PharmD Clinical Pharmacist  05/07/2021 11:19 AM

## 2021-05-08 ENCOUNTER — Other Ambulatory Visit: Payer: Self-pay

## 2021-05-08 LAB — COMPREHENSIVE METABOLIC PANEL
ALT: 16 U/L (ref 0–44)
AST: 23 U/L (ref 15–41)
Albumin: 3.8 g/dL (ref 3.5–5.0)
Alkaline Phosphatase: 55 U/L (ref 38–126)
Anion gap: 7 (ref 5–15)
BUN: 9 mg/dL (ref 6–20)
CO2: 24 mmol/L (ref 22–32)
Calcium: 8 mg/dL — ABNORMAL LOW (ref 8.9–10.3)
Chloride: 103 mmol/L (ref 98–111)
Creatinine, Ser: 0.5 mg/dL (ref 0.44–1.00)
GFR, Estimated: >60 mL/min (ref >60)
Glucose, Bld: 164 mg/dL — ABNORMAL HIGH (ref 70–99)
Potassium: 3.5 mmol/L (ref 3.5–5.1)
Sodium: 134 mmol/L — ABNORMAL LOW (ref 135–145)
Total Bilirubin: 0.6 mg/dL (ref 0.3–1.2)
Total Protein: 6.9 g/dL (ref 6.5–8.1)

## 2021-05-08 LAB — CBC
HCT: 37.5 % (ref 36.0–46.0)
Hemoglobin: 12.7 g/dL (ref 12.0–15.0)
MCH: 31.8 pg (ref 26.0–34.0)
MCHC: 33.9 g/dL (ref 30.0–36.0)
MCV: 94 fL (ref 80.0–100.0)
Platelets: 345 10*3/uL (ref 150–400)
RBC: 3.99 MIL/uL (ref 3.87–5.11)
RDW: 15.1 % (ref 11.5–15.5)
WBC: 11.9 10*3/uL — ABNORMAL HIGH (ref 4.0–10.5)
nRBC: 0 % (ref 0.0–0.2)

## 2021-05-08 LAB — LIPASE, BLOOD: Lipase: 291 U/L — ABNORMAL HIGH (ref 11–51)

## 2021-05-08 LAB — HIV ANTIBODY (ROUTINE TESTING W REFLEX): HIV Screen 4th Generation wRfx: NONREACTIVE

## 2021-05-08 MED ORDER — CARISOPRODOL 350 MG PO TABS
350.0000 mg | ORAL_TABLET | Freq: Three times a day (TID) | ORAL | Status: DC
  Administered 2021-05-08 – 2021-05-11 (×10): 350 mg via ORAL
  Filled 2021-05-08 (×10): qty 1

## 2021-05-08 MED ORDER — NICOTINE 21 MG/24HR TD PT24
21.0000 mg | MEDICATED_PATCH | Freq: Every day | TRANSDERMAL | Status: DC
  Administered 2021-05-08 – 2021-05-09 (×2): 21 mg via TRANSDERMAL
  Filled 2021-05-08 (×4): qty 1

## 2021-05-08 MED ORDER — BUPRENORPHINE HCL 8 MG SL SUBL
8.0000 mg | SUBLINGUAL_TABLET | Freq: Three times a day (TID) | SUBLINGUAL | Status: DC
  Administered 2021-05-08 – 2021-05-11 (×9): 8 mg via SUBLINGUAL
  Filled 2021-05-08 (×9): qty 1

## 2021-05-08 MED ORDER — PANTOPRAZOLE SODIUM 40 MG PO TBEC
40.0000 mg | DELAYED_RELEASE_TABLET | Freq: Every day | ORAL | Status: DC
  Administered 2021-05-08 – 2021-05-11 (×4): 40 mg via ORAL
  Filled 2021-05-08 (×4): qty 1

## 2021-05-08 NOTE — Plan of Care (Signed)
Patient ID: Levert Feinstein, female   DOB: [birth year], 35 y.o.   MRN: [id number]  Problem: Education: Goal: Knowledge of General Education information will improve Description: Including pain rating scale, medication(s)/side effects and non-pharmacologic comfort measures Outcome: Progressing   Problem: Health Behavior/Discharge Planning: Goal: Ability to manage health-related needs will improve Outcome: Progressing   Problem: Clinical Measurements: Goal: Ability to maintain clinical measurements within normal limits will improve Outcome: Progressing Goal: Will remain free from infection Outcome: Progressing Goal: Diagnostic test results will improve Outcome: Progressing Goal: Respiratory complications will improve Outcome: Progressing Goal: Cardiovascular complication will be avoided Outcome: Progressing   Problem: Activity: Goal: Risk for activity intolerance will decrease Outcome: Progressing   Problem: Nutrition: Goal: Adequate nutrition will be maintained Outcome: Progressing   Problem: Coping: Goal: Level of anxiety will decrease Outcome: Progressing   Problem: Elimination: Goal: Will not experience complications related to bowel motility Outcome: Progressing Goal: Will not experience complications related to urinary retention Outcome: Progressing   Problem: Pain Managment: Goal: General experience of comfort will improve Outcome: Progressing   Problem: Safety: Goal: Ability to remain free from injury will improve Outcome: Progressing   Problem: Skin Integrity: Goal: Risk for impaired skin integrity will decrease Outcome: Progressing  Lidia Collum, RN

## 2021-05-08 NOTE — Progress Notes (Signed)
PROGRESS NOTE    Breanna Mathews  HUD:[id number] DOB: May 23, 1986 DOA: 05/07/2021 PCP: Lyn Records, MD    Brief Narrative:  35 y.o. female with medical history significant of chronic pain on Subutex, back pain, anxiety, GERD, hypertension, mitral valve prolapse who presents for evaluation of abdominal pain associated with intractable nausea and vomiting.  Patient presented initially to the emergency room on the day prior to presentation.  At that time she was told she has gallstones and given outpatient follow-up with general surgery.  She presents back the day after stating worsening abdominal pain.  Laboratory investigation significant for elevated lipase.  Abdominal ultrasound performed in ED demonstrates cholelithiasis without evidence of acute cholecystitis.  Patient has significant amount of abdominal pain.  Has not tried to eat anything.  Hemodynamically stable.  CT imaging survey consistent with acute pancreatitis.   Assessment & Plan:   Active Problems:   Pancreatitis  Acute pancreatitis Cholelithiasis CT imaging survey consistent with above diagnosis Likely secondary to gallstones No history of alcohol intake No recent changes in medication No other obvious inciting factors 11/3: Slight uptrend in lipase Plan: Continue aggressive IV fluids Multimodal pain control Limit IV narcotic use As needed antiemetics Trend lipase Continue clear liquids   Chronic pain Patient on outpatient pain contract Takes Subutex 3 times daily Verified through pharmacy Plan: Home Subutex regimen restarted by pharmacy   Anxiety/depression Initial home MAR was inaccurate Verified by pharmacy, assistance appreciated Continue current psychiatric regimen    History of type 2 diabetes mellitus Home metformin on hold   Essential hypertension PTA labetalol stopped, patient does not take this medication As needed hydralazine    DVT prophylaxis: SQ Lovenox Code Status:  Full Family Communication: None today Disposition Plan: Status is: Inpatient  Remains inpatient appropriate because: Acute pancreatitis.  Remains symptomatic.       Level of care: Med-Surg  Consultants:  None  Procedures:  None  Antimicrobials: None   Subjective: Patient seen and examined.  Reports mild improvement in abdominal pain over interval.  Objective: Vitals:   05/08/21 0000 05/08/21 0123 05/08/21 0145 05/08/21 0845  BP: 116/77 130/86 (!) 142/91 124/79  Pulse: 72 71 68 75  Resp: 14 14 16 16   Temp:   97.9 F (36.6 C) 98.1 F (36.7 C)  TempSrc:   Oral Oral  SpO2: 96% 99% 99% 94%  Weight:   112.5 kg   Height:   5\' 9"  (1.753 m)     Intake/Output Summary (Last 24 hours) at 05/08/2021 1043 Last data filed at 05/08/2021 1031 Gross per 24 hour  Intake 4763.24 ml  Output --  Net 4763.24 ml   Filed Weights   05/07/21 1118 05/08/21 0145  Weight: 108.9 kg 112.5 kg    Examination:  General exam: Mild distress due to pain Respiratory system: Clear to auscultation. Respiratory effort normal. Cardiovascular system: S1-S2, RRR, no murmurs, no pedal edema Gastrointestinal system: Obese, soft, nondistended, tender to palpation epigastrium, normal bowel sounds Central nervous system: Alert and oriented. No focal neurological deficits. Extremities: Symmetric 5 x 5 power. Skin: No rashes, lesions or ulcers Psychiatry: Judgement and insight appear normal. Mood & affect appropriate.     Data Reviewed: I have personally reviewed following labs and imaging studies  CBC: Recent Labs  Lab 05/07/21 0713 05/08/21 0650  WBC 19.6* 11.9*  HGB 15.4* 12.7  HCT 42.8 37.5  MCV 90.3 94.0  PLT 453* 345   Basic Metabolic Panel: Recent Labs  Lab 05/07/21 0713 05/08/21 0650  NA 132* 134*  K 3.7 3.5  CL 103 103  CO2 18* 24  GLUCOSE 225* 164*  BUN 10 9  CREATININE 0.46 0.50  CALCIUM 8.7* 8.0*   GFR: Estimated Creatinine Clearance: 128.7 mL/min (by C-G formula  based on SCr of 0.5 mg/dL). Liver Function Tests: Recent Labs  Lab 05/07/21 0713 05/08/21 0650  AST 19 23  ALT 19 16  ALKPHOS 64 55  BILITOT 0.7 0.6  PROT 8.1 6.9  ALBUMIN 4.4 3.8   Recent Labs  Lab 05/07/21 0713 05/08/21 0650  LIPASE 249* 291*   No results for input(s): AMMONIA in the last 168 hours. Coagulation Profile: No results for input(s): INR, PROTIME in the last 168 hours. Cardiac Enzymes: No results for input(s): CKTOTAL, CKMB, CKMBINDEX, TROPONINI in the last 168 hours. BNP (last 3 results) No results for input(s): PROBNP in the last 8760 hours. HbA1C: No results for input(s): HGBA1C in the last 72 hours. CBG: No results for input(s): GLUCAP in the last 168 hours. Lipid Profile: No results for input(s): CHOL, HDL, LDLCALC, TRIG, CHOLHDL, LDLDIRECT in the last 72 hours. Thyroid Function Tests: No results for input(s): TSH, T4TOTAL, FREET4, T3FREE, THYROIDAB in the last 72 hours. Anemia Panel: No results for input(s): VITAMINB12, FOLATE, FERRITIN, TIBC, IRON, RETICCTPCT in the last 72 hours. Sepsis Labs: No results for input(s): PROCALCITON, LATICACIDVEN in the last 168 hours.  Recent Results (from the past 240 hour(s))  Resp Panel by RT-PCR (Flu A&B, Covid) Nasopharyngeal Swab     Status: None   Collection Time: 05/07/21  8:53 PM   Specimen: Nasopharyngeal Swab; Nasopharyngeal(NP) swabs in vial transport medium  Result Value Ref Range Status   SARS Coronavirus 2 by RT PCR NEGATIVE NEGATIVE Final    Comment: (NOTE) SARS-CoV-2 target nucleic acids are NOT DETECTED.  The SARS-CoV-2 RNA is generally detectable in upper respiratory specimens during the acute phase of infection. The lowest concentration of SARS-CoV-2 viral copies this assay can detect is 138 copies/mL. A negative result does not preclude SARS-Cov-2 infection and should not be used as the sole basis for treatment or other patient management decisions. A negative result may occur with  improper  specimen collection/handling, submission of specimen other than nasopharyngeal swab, presence of viral mutation(s) within the areas targeted by this assay, and inadequate number of viral copies(<138 copies/mL). A negative result must be combined with clinical observations, patient history, and epidemiological information. The expected result is Negative.  Fact Sheet for Patients:  BloggerCourse.com  Fact Sheet for Healthcare Providers:  SeriousBroker.it  This test is no t yet approved or cleared by the [place] FDA and  has been authorized for detection and/or diagnosis of SARS-CoV-2 by FDA under an Emergency Use Authorization (EUA). This EUA will remain  in effect (meaning this test can be used) for the duration of the COVID-19 declaration under Section 564(b)(1) of the Act, 21 U.S.C.section 360bbb-3(b)(1), unless the authorization is terminated  or revoked sooner.       Influenza A by PCR NEGATIVE NEGATIVE Final   Influenza B by PCR NEGATIVE NEGATIVE Final    Comment: (NOTE) The Xpert Xpress SARS-CoV-2/FLU/RSV plus assay is intended as an aid in the diagnosis of influenza from Nasopharyngeal swab specimens and should not be used as a sole basis for treatment. Nasal washings and aspirates are unacceptable for Xpert Xpress SARS-CoV-2/FLU/RSV testing.  Fact Sheet for Patients: BloggerCourse.com  Fact Sheet for Healthcare Providers: SeriousBroker.it  This test is not yet approved or cleared by the  [place] [employer] trader and has been authorized for detection and/or diagnosis of SARS-CoV-2 by FDA under an TEFL teacher (EUA). This EUA will remain in effect (meaning this test can be used) for the duration of the COVID-19 declaration under Section 564(b)(1) of the Act, 21 U.S.C. section 360bbb-3(b)(1), unless the authorization is terminated or revoked.  Performed at  [facility], [address], [place], [place] [address]          Radiology Studies: CT ABDOMEN PELVIS WO CONTRAST  Result Date: 05/07/2021 CLINICAL DATA:  Epigastric abdominal pain. EXAM: CT ABDOMEN AND PELVIS WITHOUT CONTRAST TECHNIQUE: Multidetector CT imaging of the abdomen and pelvis was performed following the standard protocol without IV contrast. COMPARISON:  Ultrasound, same date. FINDINGS: Lower chest: The lung bases are clear of acute process. No pleural effusion or pulmonary lesions. The heart is normal in size. No pericardial effusion. The distal esophagus and aorta are unremarkable. Hepatobiliary: No hepatic lesions are identified without contrast. No intrahepatic biliary dilatation. The ultrasound suggested fatty infiltration of the liver but this is not evident on the CT scan. The gallbladder demonstrates small layering gallstones but no CT findings to suggest acute cholecystitis. No common bile duct dilatation. Pancreas: The body and body tail junction region of the pancreas is enlarged, inflamed and edematous. There is also surrounding inflammatory changes. Findings consistent with acute focal pancreatitis. Spleen: Normal size.  No focal lesions. Adrenals/Urinary Tract: Adrenal glands and kidneys are unremarkable. No renal calculi or hydronephrosis. No renal or bladder lesions are identified without contrast. Stomach/Bowel: The stomach, duodenum, small bowel and colon are grossly normal. No acute inflammatory process, mass lesions or obstructive findings. The terminal ileum is normal. The appendix is surgically absent. Vascular/Lymphatic: Age advanced distal aortic and iliac artery calcifications. No aneurysm. No mesenteric or retroperitoneal mass or adenopathy. Reproductive: The uterus and ovaries are unremarkable. Other: No pelvic mass or adenopathy. No free pelvic fluid collections. No inguinal mass or adenopathy. No abdominal wall hernia or subcutaneous lesions. Moderate  debris in the vagina likely due to menses and possible tampon. Musculoskeletal: Stable fusion hardware at T11-12. 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 IMPRESSION: 1. CT findings consistent with acute focal pancreatitis involving the body and body tail junction region of the pancreas. 2. Cholelithiasis. 3. Age advanced distal aortic and iliac artery calcifications. Aortic Atherosclerosis (ICD10-I70.0). Electronically Signed   By: Rudie Meyer M.D.   On: 05/07/2021 11:51   US Abdomen Limited RUQ (LIVER/GB)  Result Date: 05/07/2021 CLINICAL DATA:  Right upper quadrant/epigastric pain. EXAM: ULTRASOUND ABDOMEN LIMITED RIGHT UPPER QUADRANT COMPARISON:  Ultrasounds 01/09/2017 FINDINGS: Gallbladder: Multiple echogenic shadowing gallstones are noted. No gallbladder wall thickening, pericholecystic fluid or sonographic Murphy sign to suggest acute cholecystitis. The largest calculus measures 9 mm. Common bile duct: Diameter: 4.3 mm Liver: Diffuse increased echogenicity of the liver suggesting fatty infiltration. No hepatic lesions or intrahepatic biliary dilatation. Portal vein is patent on color Doppler imaging with normal direction of blood flow towards the liver. Other: None. IMPRESSION: 1. Cholelithiasis without sonographic findings for acute cholecystitis. 2. No intra or extrahepatic biliary dilatation. 3. Diffuse increased echogenicity of the liver suggesting fatty infiltration. Electronically Signed   By: Rudie Meyer M.D.   On: 05/07/2021 10:41        Scheduled Meds:  ARIPiprazole  2 mg Oral Daily   aspirin  81 mg Oral BID   buprenorphine  8 mg Sublingual TID   carisoprodol  350 mg Oral TID   diphenhydrAMINE  50 mg Oral TID   enoxaparin (LOVENOX) injection  0.5 mg/kg Subcutaneous Q24H   FLUoxetine  20 mg Oral Daily   irbesartan  300 mg Oral Daily   And   hydrochlorothiazide  25 mg Oral Daily   nicotine  21 mg Transdermal Daily   Continuous Infusions:  lactated ringers 150  mL/hr at 05/08/21 0909     LOS: 1 day    Time spent: 25 minutes    Tresa Moore, MD [facility]   If 7PM-7AM, please contact night-coverage  05/08/2021, 10:43 AM

## 2021-05-09 LAB — BASIC METABOLIC PANEL
Anion gap: 8 (ref 5–15)
BUN: 6 mg/dL (ref 6–20)
CO2: 24 mmol/L (ref 22–32)
Calcium: 8.3 mg/dL — ABNORMAL LOW (ref 8.9–10.3)
Chloride: 102 mmol/L (ref 98–111)
Creatinine, Ser: 0.58 mg/dL (ref 0.44–1.00)
GFR, Estimated: >60 mL/min (ref >60)
Glucose, Bld: 189 mg/dL — ABNORMAL HIGH (ref 70–99)
Potassium: 3.5 mmol/L (ref 3.5–5.1)
Sodium: 134 mmol/L — ABNORMAL LOW (ref 135–145)

## 2021-05-09 LAB — CBC WITH DIFFERENTIAL/PLATELET
Abs Immature Granulocytes: 0.03 10*3/uL (ref 0.00–0.07)
Basophils Absolute: 0.1 10*3/uL (ref 0.0–0.1)
Basophils Relative: 1 %
Eosinophils Absolute: 0.2 10*3/uL (ref 0.0–0.5)
Eosinophils Relative: 2 %
HCT: 35.5 % — ABNORMAL LOW (ref 36.0–46.0)
Hemoglobin: 11.9 g/dL — ABNORMAL LOW (ref 12.0–15.0)
Immature Granulocytes: 0 %
Lymphocytes Relative: 37 %
Lymphs Abs: 3.1 10*3/uL (ref 0.7–4.0)
MCH: 31.6 pg (ref 26.0–34.0)
MCHC: 33.5 g/dL (ref 30.0–36.0)
MCV: 94.2 fL (ref 80.0–100.0)
Monocytes Absolute: 0.5 10*3/uL (ref 0.1–1.0)
Monocytes Relative: 6 %
Neutro Abs: 4.5 10*3/uL (ref 1.7–7.7)
Neutrophils Relative %: 54 %
Platelets: 315 10*3/uL (ref 150–400)
RBC: 3.77 MIL/uL — ABNORMAL LOW (ref 3.87–5.11)
RDW: 14.9 % (ref 11.5–15.5)
WBC: 8.3 10*3/uL (ref 4.0–10.5)
nRBC: 0 % (ref 0.0–0.2)

## 2021-05-09 LAB — GLUCOSE, CAPILLARY: Glucose-Capillary: 217 mg/dL — ABNORMAL HIGH (ref 70–99)

## 2021-05-09 LAB — LIPASE, BLOOD: Lipase: 77 U/L — ABNORMAL HIGH (ref 11–51)

## 2021-05-09 MED ORDER — LORAZEPAM 2 MG/ML IJ SOLN
1.0000 mg | Freq: Once | INTRAMUSCULAR | Status: AC | PRN
  Administered 2021-05-10: 1 mg via INTRAVENOUS
  Filled 2021-05-09: qty 1

## 2021-05-09 MED ORDER — HYDROMORPHONE HCL 1 MG/ML IJ SOLN
1.0000 mg | INTRAMUSCULAR | Status: DC | PRN
  Administered 2021-05-09 – 2021-05-10 (×4): 1 mg via INTRAVENOUS
  Filled 2021-05-09 (×4): qty 1

## 2021-05-09 MED ORDER — TRAZODONE HCL 100 MG PO TABS
100.0000 mg | ORAL_TABLET | Freq: Every evening | ORAL | Status: DC | PRN
  Administered 2021-05-09: 100 mg via ORAL
  Filled 2021-05-09 (×2): qty 1

## 2021-05-09 NOTE — TOC Initial Note (Signed)
Transition of Care [facility]) - Initial/Assessment Note    Patient Details  Name: Breanna Mathews MRN: [id number] Date of Birth: 1985-10-03  [facility]) CM/SW Contact:    Margarito Liner, LCSW Phone Number: 05/09/2021, 12:41 PM  Clinical Narrative:    Readmission prevention screen complete. CSW called patient, introduced role, and explained that discharge planning would be discussed. Patient lives home with her family. PCP is Lyn Records, MD at [facility]. Patient drives herself to appointments. Pharmacy is [facility] in [place]. No issues obtaining medications. No home health or DME use prior to admission. No further concerns. CSW encouraged patient to contact CSW as needed. CSW will continue to follow patient for support and facilitate return home when stable              Expected Discharge Plan: Home/Self Care Barriers to Discharge: Continued Medical Work up   Patient Goals and CMS Choice        Expected Discharge Plan and Services Expected Discharge Plan: Home/Self Care     Post Acute Care Choice: NA Living arrangements for the past 2 months: Single Family Home                                      Prior Living Arrangements/Services Living arrangements for the past 2 months: Single Family Home Lives with:: Spouse Patient language and need for interpreter reviewed:: Yes Do you feel safe going back to the place where you live?: Yes      Need for Family Participation in Patient Care: Yes (Comment) Care giver support system in place?: Yes (comment)   Criminal Activity/Legal Involvement Pertinent to Current Situation/Hospitalization: No - Comment as needed  Activities of Daily Living Home Assistive Devices/Equipment: None ADL Screening (condition at time of admission) Patient's cognitive ability adequate to safely complete daily activities?: Yes Is the patient deaf or have difficulty hearing?: No Does the patient have difficulty seeing,  even when wearing glasses/contacts?: No Does the patient have difficulty concentrating, remembering, or making decisions?: No Patient able to express need for assistance with ADLs?: Yes Does the patient have difficulty dressing or bathing?: No Independently performs ADLs?: Yes (appropriate for developmental age) Does the patient have difficulty walking or climbing stairs?: No Weakness of Legs: None Weakness of Arms/Hands: None  Permission Sought/Granted                  Emotional Assessment Appearance:: Appears stated age Attitude/Demeanor/Rapport: Engaged Affect (typically observed): Accepting, Calm, Pleasant Orientation: : Oriented to Self, Oriented to Place, Oriented to  Time, Oriented to Situation Alcohol / Substance Use: Not Applicable Psych Involvement: No (comment)  Admission diagnosis:  Pancreatitis [K85.90] Epigastric pain [R10.13] RUQ pain [R10.11] Acute pancreatitis, unspecified complication status, unspecified pancreatitis type [K85.90] Patient Active Problem List   Diagnosis Date Noted   Pancreatitis 05/07/2021   Abdominal pain in pregnancy 01/09/2017   Drug-seeking behavior 07/30/2016   Opioid use agreement exists 07/30/2016   Acute low back pain 06/19/2016   Chronic jaw pain 01/16/2016   Oropharyngeal lesion 01/16/2016   Mid sternal chest pain 10/04/2015   Bipolar disorder with depression (HCC) 09/03/2015   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   Spell of altered consciousness 05/15/2015   Dependence on nicotine from cigarettes 04/10/2015   History of hypertension 04/10/2015   Intentional fentanyl overdose (HCC) 04/10/2015   Sinus tachycardia 04/10/2015   Suicidal ideation 04/10/2015  Chronic pain disorder 04/09/2015   Dissociative episodes 04/09/2015   Mood disorder ([place]) 04/09/2015   Opioid use disorder, severe, dependence ([place]) 04/09/2015   Chronic bilateral low back pain with bilateral sciatica 03/08/2015   Borderline personality disorder  ([place]) 01/16/2015   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 ([place]) [id number]   Uncomplicated opioid dependence ([place]) 06/22/2014   Essential (primary) hypertension 03/17/2013   Migraine with aura and without status migrainosus, not intractable 03/17/2013   Other specified anxiety disorders 03/17/2013   Spondylosis of cervical region without myelopathy or radiculopathy 07/11/2012   Spondylosis of thoracic region without myelopathy or radiculopathy 07/11/2012   Cannabis abuse, uncomplicated 0000000   PCP:  Chase Picket, MD Pharmacy:   [facility] [place], [place] [facility] [address] AT [place] [facility] [place] [address] Phone: [phone] Fax: [phone]     Social Determinants of Health ([place]) Interventions    Readmission Risk Interventions Readmission Risk Prevention Plan 05/09/2021  Transportation Screening Complete  PCP or Specialist Appt within 5-7 Days Complete  Medication Review (RN CM) Complete  Some recent data might be hidden

## 2021-05-09 NOTE — Progress Notes (Signed)
PROGRESS NOTE    Breanna Mathews  RJJ:[id number] DOB: 1985/08/19 DOA: 05/07/2021 PCP: Lyn Records, MD    Brief Narrative:  35 y.o. female with medical history significant of chronic pain on Subutex, back pain, anxiety, GERD, hypertension, mitral valve prolapse who presents for evaluation of abdominal pain associated with intractable nausea and vomiting.  Patient presented initially to the emergency room on the day prior to presentation.  At that time she was told she has gallstones and given outpatient follow-up with general surgery.  She presents back the day after stating worsening abdominal pain.  Laboratory investigation significant for elevated lipase.  Abdominal ultrasound performed in ED demonstrates cholelithiasis without evidence of acute cholecystitis.  Patient has significant amount of abdominal pain.  Has not tried to eat anything.  Hemodynamically stable.  CT imaging survey consistent with acute pancreatitis.   Assessment & Plan:   Active Problems:   Pancreatitis  Acute pancreatitis Cholelithiasis CT imaging survey consistent with above diagnosis Likely secondary to gallstones No history of alcohol intake No recent changes in medication No other obvious inciting factors 11/3: Slight uptrend in lipase 11/4: Lipase 77 Plan: Decrease rate of IV fluids Multimodal pain control, limit IV narcotic use Advance to soft diet As needed antiemetics Possible discharge in 24 hours    Chronic pain Patient on outpatient pain contract Takes Subutex 3 times daily Verified through pharmacy Plan: Pharmacy consulted for Subutex dosing   Anxiety/depression Initial home MAR was inaccurate Verified by pharmacy, assistance appreciated Continue current psychiatric regimen    History of type 2 diabetes mellitus Home metformin on hold   Essential hypertension PTA labetalol stopped, patient does not take this medication As needed hydralazine    DVT prophylaxis: SQ  Lovenox Code Status: Full Family Communication: None today Disposition Plan: Status is: Inpatient  Remains inpatient appropriate because: Acute pancreatitis.  Remains symptomatic.  Possible discharge in 24 hours       Level of care: Med-Surg  Consultants:  None  Procedures:  None  Antimicrobials: None   Subjective: Patient seen and examined.  Reports mild improvement in abdominal pain over interval.  Objective: Vitals:   05/08/21 1612 05/08/21 2030 05/09/21 0358 05/09/21 0849  BP: 123/72 (!) 157/87 115/73 124/71  Pulse: 85 98 74 (!) 101  Resp: 16 16 18 20   Temp: 98.6 F (37 C) 98.7 F (37.1 C) 99 F (37.2 C) 98.2 F (36.8 C)  TempSrc: Oral Oral Oral Oral  SpO2: 98% 98% 100% 97%  Weight:      Height:        Intake/Output Summary (Last 24 hours) at 05/09/2021 1313 Last data filed at 05/09/2021 0307 Gross per 24 hour  Intake 2318.04 ml  Output --  Net 2318.04 ml   Filed Weights   05/07/21 1118 05/08/21 0145  Weight: 108.9 kg 112.5 kg    Examination:  General exam: No acute distress Respiratory system: Clear to auscultation. Respiratory effort normal. Cardiovascular system: S1-S2, RRR, no murmurs, no pedal edema Gastrointestinal system: Obese soft, mildly tender to palpation epigastrium, normal bowel sounds Central nervous system: Alert and oriented. No focal neurological deficits. Extremities: Symmetric 5 x 5 power. Skin: No rashes, lesions or ulcers Psychiatry: Judgement and insight appear normal. Mood & affect appropriate.     Data Reviewed: I have personally reviewed following labs and imaging studies  CBC: Recent Labs  Lab 05/07/21 0713 05/08/21 0650 05/09/21 0844  WBC 19.6* 11.9* 8.3  NEUTROABS  --   --  4.5  HGB 15.4*  12.7 11.9*  HCT 42.8 37.5 35.5*  MCV 90.3 94.0 94.2  PLT 453* 345 315   Basic Metabolic Panel: Recent Labs  Lab 05/07/21 0713 05/08/21 0650 05/09/21 0844  NA 132* 134* 134*  K 3.7 3.5 3.5  CL 103 103 102  CO2  18* 24 24  GLUCOSE 225* 164* 189*  BUN 10 9 6   CREATININE 0.46 0.50 0.58  CALCIUM 8.7* 8.0* 8.3*   GFR: Estimated Creatinine Clearance: 128.7 mL/min (by C-G formula based on SCr of 0.58 mg/dL). Liver Function Tests: Recent Labs  Lab 05/07/21 0713 05/08/21 0650  AST 19 23  ALT 19 16  ALKPHOS 64 55  BILITOT 0.7 0.6  PROT 8.1 6.9  ALBUMIN 4.4 3.8   Recent Labs  Lab 05/07/21 0713 05/08/21 0650 05/09/21 0844  LIPASE 249* 291* 77*   No results for input(s): AMMONIA in the last 168 hours. Coagulation Profile: No results for input(s): INR, PROTIME in the last 168 hours. Cardiac Enzymes: No results for input(s): CKTOTAL, CKMB, CKMBINDEX, TROPONINI in the last 168 hours. BNP (last 3 results) No results for input(s): PROBNP in the last 8760 hours. HbA1C: No results for input(s): HGBA1C in the last 72 hours. CBG: No results for input(s): GLUCAP in the last 168 hours. Lipid Profile: No results for input(s): CHOL, HDL, LDLCALC, TRIG, CHOLHDL, LDLDIRECT in the last 72 hours. Thyroid Function Tests: No results for input(s): TSH, T4TOTAL, FREET4, T3FREE, THYROIDAB in the last 72 hours. Anemia Panel: No results for input(s): VITAMINB12, FOLATE, FERRITIN, TIBC, IRON, RETICCTPCT in the last 72 hours. Sepsis Labs: No results for input(s): PROCALCITON, LATICACIDVEN in the last 168 hours.  Recent Results (from the past 240 hour(s))  Resp Panel by RT-PCR (Flu A&B, Covid) Nasopharyngeal Swab     Status: None   Collection Time: 05/07/21  8:53 PM   Specimen: Nasopharyngeal Swab; Nasopharyngeal(NP) swabs in vial transport medium  Result Value Ref Range Status   SARS Coronavirus 2 by RT PCR NEGATIVE NEGATIVE Final    Comment: (NOTE) SARS-CoV-2 target nucleic acids are NOT DETECTED.  The SARS-CoV-2 RNA is generally detectable in upper respiratory specimens during the acute phase of infection. The lowest concentration of SARS-CoV-2 viral copies this assay can detect is 138 copies/mL. A  negative result does not preclude SARS-Cov-2 infection and should not be used as the sole basis for treatment or other patient management decisions. A negative result may occur with  improper specimen collection/handling, submission of specimen other than nasopharyngeal swab, presence of viral mutation(s) within the areas targeted by this assay, and inadequate number of viral copies(<138 copies/mL). A negative result must be combined with clinical observations, patient history, and epidemiological information. The expected result is Negative.  Fact Sheet for Patients:  13/02/22  Fact Sheet for Healthcare Providers:  BloggerCourse.com  This test is no t yet approved or cleared by the [place] FDA and  has been authorized for detection and/or diagnosis of SARS-CoV-2 by FDA under an Emergency Use Authorization (EUA). This EUA will remain  in effect (meaning this test can be used) for the duration of the COVID-19 declaration under Section 564(b)(1) of the Act, 21 U.S.C.section 360bbb-3(b)(1), unless the authorization is terminated  or revoked sooner.       Influenza A by PCR NEGATIVE NEGATIVE Final   Influenza B by PCR NEGATIVE NEGATIVE Final    Comment: (NOTE) The Xpert Xpress SARS-CoV-2/FLU/RSV plus assay is intended as an aid in the diagnosis of influenza from Nasopharyngeal swab specimens and should  not be used as a sole basis for treatment. Nasal washings and aspirates are unacceptable for Xpert Xpress SARS-CoV-2/FLU/RSV testing.  Fact Sheet for Patients: BloggerCourse.com  Fact Sheet for Healthcare Providers: [employer]ious[employer]ker[employer]it  This test is not yet approved or cleared by the [place] FDA and has been authorized for detection and/or diagnosis of SARS-CoV-2 by FDA under an Emergency Use Authorization (EUA). This EUA will remain in effect (meaning this test can  be used) for the duration of the COVID-19 declaration under Section 564(b)(1) of the Act, 21 U.S.C. section 360bbb-3(b)(1), unless the authorization is terminated or revoked.  Performed at [facility], [address], [place], [place] [address]          Radiology Studies: No results found.      Scheduled Meds:  ARIPiprazole  2 mg Oral Daily   aspirin  81 mg Oral BID   buprenorphine  8 mg Sublingual TID   carisoprodol  350 mg Oral TID   diphenhydrAMINE  50 mg Oral TID   enoxaparin (LOVENOX) injection  0.5 mg/kg Subcutaneous Q24H   FLUoxetine  20 mg Oral Daily   irbesartan  300 mg Oral Daily   And   hydrochlorothiazide  25 mg Oral Daily   nicotine  21 mg Transdermal Daily   pantoprazole  40 mg Oral Daily   Continuous Infusions:  lactated ringers 100 mL/hr at 05/09/21 0307     LOS: 2 days    Time spent: 25 minutes    Tresa Moore, MD [facility]   If 7PM-7AM, please contact night-coverage  05/09/2021, 1:13 PM

## 2021-05-10 LAB — CBC WITH DIFFERENTIAL/PLATELET
Abs Immature Granulocytes: 0.04 10*3/uL (ref 0.00–0.07)
Basophils Absolute: 0 10*3/uL (ref 0.0–0.1)
Basophils Relative: 1 %
Eosinophils Absolute: 0.2 10*3/uL (ref 0.0–0.5)
Eosinophils Relative: 2 %
HCT: 36.2 % (ref 36.0–46.0)
Hemoglobin: 12.2 g/dL (ref 12.0–15.0)
Immature Granulocytes: 1 %
Lymphocytes Relative: 37 %
Lymphs Abs: 2.6 10*3/uL (ref 0.7–4.0)
MCH: 31.7 pg (ref 26.0–34.0)
MCHC: 33.7 g/dL (ref 30.0–36.0)
MCV: 94 fL (ref 80.0–100.0)
Monocytes Absolute: 0.5 10*3/uL (ref 0.1–1.0)
Monocytes Relative: 7 %
Neutro Abs: 3.8 10*3/uL (ref 1.7–7.7)
Neutrophils Relative %: 52 %
Platelets: 282 10*3/uL (ref 150–400)
RBC: 3.85 MIL/uL — ABNORMAL LOW (ref 3.87–5.11)
RDW: 14.6 % (ref 11.5–15.5)
WBC: 7.2 10*3/uL (ref 4.0–10.5)
nRBC: 0 % (ref 0.0–0.2)

## 2021-05-10 LAB — LIPASE, BLOOD: Lipase: 83 U/L — ABNORMAL HIGH (ref 11–51)

## 2021-05-10 LAB — BASIC METABOLIC PANEL
Anion gap: 6 (ref 5–15)
BUN: 8 mg/dL (ref 6–20)
CO2: 30 mmol/L (ref 22–32)
Calcium: 8.7 mg/dL — ABNORMAL LOW (ref 8.9–10.3)
Chloride: 99 mmol/L (ref 98–111)
Creatinine, Ser: 0.59 mg/dL (ref 0.44–1.00)
GFR, Estimated: >60 mL/min (ref >60)
Glucose, Bld: 201 mg/dL — ABNORMAL HIGH (ref 70–99)
Potassium: 3.4 mmol/L — ABNORMAL LOW (ref 3.5–5.1)
Sodium: 135 mmol/L (ref 135–145)

## 2021-05-10 MED ORDER — HYDROMORPHONE HCL 1 MG/ML IJ SOLN
0.5000 mg | INTRAMUSCULAR | Status: DC | PRN
  Administered 2021-05-10 – 2021-05-11 (×4): 0.5 mg via INTRAVENOUS
  Filled 2021-05-10 (×4): qty 0.5

## 2021-05-10 MED ORDER — OXYCODONE HCL 5 MG PO TABS
5.0000 mg | ORAL_TABLET | ORAL | Status: DC | PRN
  Administered 2021-05-10 – 2021-05-11 (×5): 10 mg via ORAL
  Administered 2021-05-11: 5 mg via ORAL
  Filled 2021-05-10 (×6): qty 2

## 2021-05-10 NOTE — Progress Notes (Signed)
Patient had what appeared to be a seizure witness by charge nurse.  patient states she peed her pants as well.  She is alert and oriented at this time. She states she has these at home and still wants to be discharged later.  Ativan given by charge. MD made aware.  Patient instructed to stay on unit for safety, fall precautions continued.

## 2021-05-10 NOTE — Progress Notes (Signed)
PROGRESS NOTE    Acelynn Dejonge  QHU:[id number] DOB: 03/15/1986 DOA: 05/07/2021 PCP: Lyn Records, MD    Brief Narrative:  35 y.o. female with medical history significant of chronic pain on Subutex, back pain, anxiety, GERD, hypertension, mitral valve prolapse who presents for evaluation of abdominal pain associated with intractable nausea and vomiting.  Patient presented initially to the emergency room on the day prior to presentation.  At that time she was told she has gallstones and given outpatient follow-up with general surgery.  She presents back the day after stating worsening abdominal pain.  Laboratory investigation significant for elevated lipase.  Abdominal ultrasound performed in ED demonstrates cholelithiasis without evidence of acute cholecystitis.  Patient has significant amount of abdominal pain.  Has not tried to eat anything.  Hemodynamically stable.  CT imaging survey consistent with acute pancreatitis.   Assessment & Plan:   Active Problems:   Pancreatitis  Acute pancreatitis Cholelithiasis CT imaging survey consistent with above diagnosis Likely secondary to gallstones No history of alcohol intake No recent changes in medication No other obvious inciting factors 11/3: Slight uptrend in lipase 11/4: Lipase 77 11/5: Lipase 83 Plan: Continue IV fluids Multimodal pain control, limit IV narcotic use Continue soft diet as tolerated As needed antiemetics Anticipated discharge 11/6    Chronic pain Patient on outpatient pain contract Takes Subutex 3 times daily Verified through pharmacy Plan: Pharmacy assistance appreciated with Subutex dosing.  Also on concomitant narcotics.  This makes pain control quite challenging as patient is extremely opioid tolerant.  Will not be able to send a large amount of narcotics as patient is on a pain contract.  Will send 1 week of oxycodone.  The on that I strongly suggest patient to return to her primary care physician in  case refills are indicated.  Unfortunately patient is opioid tolerant which makes pain control quite an issue.  Per the patient her pain management physicians will not provide pain medication for anything beyond what they are already treating which is her chronic back pain.  As an inpatient provider I can not provide an extended course of narcotics as I am unable to follow the patient outside of the hospital.   Anxiety/depression Initial home MAR was inaccurate Verified by pharmacy, assistance appreciated Continue current psychiatric regimen    History of type 2 diabetes mellitus Home metformin on hold   Essential hypertension PTA labetalol stopped, patient does not take this medication As needed hydralazine    DVT prophylaxis: SQ Lovenox Code Status: Full Family Communication: Husband on phone 11/5 Disposition Plan: Status is: Inpatient  Remains inpatient appropriate because: Acute pancreatitis.  Remains symptomatic.  Possible discharge in 24 hours       Level of care: Med-Surg  Consultants:  None  Procedures:  None  Antimicrobials: None   Subjective: Patient seen and examined.  Still with abdominal pain but tolerating small amounts of p.o. intake.  No vomiting  Objective: Vitals:   05/10/21 0458 05/10/21 0907 05/10/21 1007 05/10/21 1041  BP: (!) 148/84 (!) 161/82 (!) 172/84 (!) 152/83  Pulse: 84 98 (!) 108 100  Resp: 15 18 14 16   Temp: 97.9 F (36.6 C) 98.3 F (36.8 C) 98.4 F (36.9 C) 98.6 F (37 C)  TempSrc: Oral Oral Oral Oral  SpO2: 97% 98% 98% 99%  Weight:      Height:        Intake/Output Summary (Last 24 hours) at 05/10/2021 1401 Last data filed at 05/09/2021 1855 Gross per 24 hour  Intake 480 ml  Output --  Net 480 ml   Filed Weights   05/07/21 1118 05/08/21 0145  Weight: 108.9 kg 112.5 kg    Examination:  General exam: Mild distress due to pain Respiratory system: Clear to auscultation. Respiratory effort normal. Cardiovascular  system: S1-S2, RRR, no murmurs, no pedal edema Gastrointestinal system: Obese, soft, tender to palpation epigastrium, normal bowel sounds Central nervous system: Alert and oriented. No focal neurological deficits. Extremities: Symmetric 5 x 5 power. Skin: No rashes, lesions or ulcers Psychiatry: Judgement and insight appear normal. Mood & affect appropriate.     Data Reviewed: I have personally reviewed following labs and imaging studies  CBC: Recent Labs  Lab 05/07/21 0713 05/08/21 0650 05/09/21 0844 05/10/21 0735  WBC 19.6* 11.9* 8.3 7.2  NEUTROABS  --   --  4.5 3.8  HGB 15.4* 12.7 11.9* 12.2  HCT 42.8 37.5 35.5* 36.2  MCV 90.3 94.0 94.2 94.0  PLT 453* 345 315 282   Basic Metabolic Panel: Recent Labs  Lab 05/07/21 0713 05/08/21 0650 05/09/21 0844 05/10/21 0735  NA 132* 134* 134* 135  K 3.7 3.5 3.5 3.4*  CL 103 103 102 99  CO2 18* 24 24 30   GLUCOSE 225* 164* 189* 201*  BUN 10 9 6 8   CREATININE 0.46 0.50 0.58 0.59  CALCIUM 8.7* 8.0* 8.3* 8.7*   GFR: Estimated Creatinine Clearance: 128.7 mL/min (by C-G formula based on SCr of 0.59 mg/dL). Liver Function Tests: Recent Labs  Lab 05/07/21 0713 05/08/21 0650  AST 19 23  ALT 19 16  ALKPHOS 64 55  BILITOT 0.7 0.6  PROT 8.1 6.9  ALBUMIN 4.4 3.8   Recent Labs  Lab 05/07/21 0713 05/08/21 0650 05/09/21 0844 05/10/21 0735  LIPASE 249* 291* 77* 83*   No results for input(s): AMMONIA in the last 168 hours. Coagulation Profile: No results for input(s): INR, PROTIME in the last 168 hours. Cardiac Enzymes: No results for input(s): CKTOTAL, CKMB, CKMBINDEX, TROPONINI in the last 168 hours. BNP (last 3 results) No results for input(s): PROBNP in the last 8760 hours. HbA1C: No results for input(s): HGBA1C in the last 72 hours. CBG: Recent Labs  Lab 05/09/21 1944  GLUCAP 217*   Lipid Profile: No results for input(s): CHOL, HDL, LDLCALC, TRIG, CHOLHDL, LDLDIRECT in the last 72 hours. Thyroid Function  Tests: No results for input(s): TSH, T4TOTAL, FREET4, T3FREE, THYROIDAB in the last 72 hours. Anemia Panel: No results for input(s): VITAMINB12, FOLATE, FERRITIN, TIBC, IRON, RETICCTPCT in the last 72 hours. Sepsis Labs: No results for input(s): PROCALCITON, LATICACIDVEN in the last 168 hours.  Recent Results (from the past 240 hour(s))  Resp Panel by RT-PCR (Flu A&B, Covid) Nasopharyngeal Swab     Status: None   Collection Time: 05/07/21  8:53 PM   Specimen: Nasopharyngeal Swab; Nasopharyngeal(NP) swabs in vial transport medium  Result Value Ref Range Status   SARS Coronavirus 2 by RT PCR NEGATIVE NEGATIVE Final    Comment: (NOTE) SARS-CoV-2 target nucleic acids are NOT DETECTED.  The SARS-CoV-2 RNA is generally detectable in upper respiratory specimens during the acute phase of infection. The lowest concentration of SARS-CoV-2 viral copies this assay can detect is 138 copies/mL. A negative result does not preclude SARS-Cov-2 infection and should not be used as the sole basis for treatment or other patient management decisions. A negative result may occur with  improper specimen collection/handling, submission of specimen other than nasopharyngeal swab, presence of viral mutation(s) within the areas targeted by  this assay, and inadequate number of viral copies(<138 copies/mL). A negative result must be combined with clinical observations, patient history, and epidemiological information. The expected result is Negative.  Fact Sheet for Patients:  BloggerCourse.com  Fact Sheet for Healthcare Providers:  [employer]it  This test is no t yet approved or cleared by the [place] FDA and  has been authorized for detection and/or diagnosis of SARS-CoV-2 by FDA under an Emergency Use Authorization (EUA). This EUA will remain  in effect (meaning this test can be used) for the duration of the COVID-19 declaration under Section  564(b)(1) of the Act, 21 U.S.C.section 360bbb-3(b)(1), unless the authorization is terminated  or revoked sooner.       Influenza A by PCR NEGATIVE NEGATIVE Final   Influenza B by PCR NEGATIVE NEGATIVE Final    Comment: (NOTE) The Xpert Xpress SARS-CoV-2/FLU/RSV plus assay is intended as an aid in the diagnosis of influenza from Nasopharyngeal swab specimens and should not be used as a sole basis for treatment. Nasal washings and aspirates are unacceptable for Xpert Xpress SARS-CoV-2/FLU/RSV testing.  Fact Sheet for Patients: BloggerCourse.com  Fact Sheet for Healthcare Providers: [employer]ker[employer]it  This test is not yet approved or cleared by the [place] FDA and has been authorized for detection and/or diagnosis of SARS-CoV-2 by FDA under an Emergency Use Authorization (EUA). This EUA will remain in effect (meaning this test can be used) for the duration of the COVID-19 declaration under Section 564(b)(1) of the Act, 21 U.S.C. section 360bbb-3(b)(1), unless the authorization is terminated or revoked.  Performed at [facility] [address] [place], [place] [address]          Radiology Studies: No results found.      Scheduled Meds:  ARIPiprazole  2 mg Oral Daily   aspirin  81 mg Oral BID   buprenorphine  8 mg Sublingual TID   carisoprodol  350 mg Oral TID   diphenhydrAMINE  50 mg Oral TID   enoxaparin (LOVENOX) injection  0.5 mg/kg Subcutaneous Q24H   FLUoxetine  20 mg Oral Daily   irbesartan  300 mg Oral Daily   And   hydrochlorothiazide  25 mg Oral Daily   nicotine  21 mg Transdermal Daily   pantoprazole  40 mg Oral Daily   Continuous Infusions:  lactated ringers 75 mL/hr at 05/09/21 1742     LOS: 3 days    Time spent: 25 minutes    Tresa Moore, MD [facility]   If 7PM-7AM, please contact night-coverage  05/10/2021, 2:01 PM

## 2021-05-11 MED ORDER — OXYCODONE-ACETAMINOPHEN 10-325 MG PO TABS: 1.0000 | ORAL_TABLET | ORAL | 0 refills | Status: AC | PRN

## 2021-05-11 MED ORDER — NICOTINE 21 MG/24HR TD PT24: 21.0000 mg | MEDICATED_PATCH | Freq: Every day | TRANSDERMAL | 0 refills | Status: AC

## 2021-05-11 MED ORDER — PANTOPRAZOLE SODIUM 40 MG PO TBEC: 40.0000 mg | DELAYED_RELEASE_TABLET | Freq: Every day | ORAL | 0 refills | Status: DC

## 2021-05-11 NOTE — Discharge Summary (Signed)
Physician Discharge Summary  Breanna Mathews ZOX:[id number] DOB: 1986-03-11 DOA: 05/07/2021  PCP: Lyn Records, MD  Admit date: 05/07/2021 Discharge date: 05/11/2021  Admitted From: Home Disposition: Home  Recommendations for Outpatient Follow-up:  Follow up with PCP in 1-2 weeks Follow-up outpatient pain management  Home Health: No Equipment/Devices: None  Discharge Condition: Stable CODE STATUS: Full Diet recommendation: Soft/bland  Brief/Interim Summary:  35 y.o. female with medical history significant of chronic pain on Subutex, back pain, anxiety, GERD, hypertension, mitral valve prolapse who presents for evaluation of abdominal pain associated with intractable nausea and vomiting.  Patient presented initially to the emergency room on the day prior to presentation.  At that time she was told she has gallstones and given outpatient follow-up with general surgery.  She presents back the day after stating worsening abdominal pain.  Laboratory investigation significant for elevated lipase.  Abdominal ultrasound performed in ED demonstrates cholelithiasis without evidence of acute cholecystitis.  Patient has significant amount of abdominal pain.  Has not tried to eat anything.  Hemodynamically stable.  CT imaging survey consistent with acute pancreatitis.  Patient progress clinically however pain control remained quite a challenge.  The patient is extremely opioid tolerant due to chronic opioid, benzodiazepine, muscle relaxant therapy.  She is followed outpatient by pain management however.  The patient pain management doctors are unwilling to write any pain medication for any acute issues beyond what they are already treating.  Patient also informed me that her primary care physician does not write narcotics.  This is a difficult situation because I am limited in the quantity and duration of narcotic therapy I can provide.  I had a lengthy discussion with patient on the day of discharge  regarding this issue.  I prescribed what I feel is a substantial amount of pain medication taking her opioid tolerance into account.  I also recommended patient strongly that she ensure adequate p.o. intake, bland soft foods, plenty of fluids.  Also advised to discontinue smoking immediately as this can worsen symptoms of pancreatitis.  Patient is recommended to seek consultation with primary care or pain management and discuss her situation and need for any ongoing pain medication.   Discharge Diagnoses:  Active Problems:   Pancreatitis  Acute pancreatitis Cholelithiasis CT imaging survey consistent with above diagnosis Likely secondary to gallstones No history of alcohol intake No recent changes in medication No other obvious inciting factors 11/3: Slight uptrend in lipase 11/4: Lipase 77 11/5: Lipase 83 Plan: Clinical pancreatitis is improved at time of discharge.  Patient tolerating a soft diet.  No vomiting or nausea noted.  Patient still has a substantial amount of pain.  This is affected by her chronic opioid use and resultant opioid tolerance.  I have provided substantial amount of pain medication on discharge but I am unable to provide any refills.  Patient needs to return to her outpatient providers if ongoing narcotic therapy is indicated.     Chronic pain Patient on outpatient pain contract Takes Subutex 3 times daily Verified through pharmacy Plan: Pharmacy assistance appreciated with Subutex dosing.  Also on concomitant narcotics.  This makes pain control quite challenging as patient is extremely opioid tolerant.  Will not be able to send a large amount of narcotics as patient is on a pain contract.  Will send 1 week of oxycodone.  The on that I strongly suggest patient to return to her primary care physician in case refills are indicated.  Unfortunately patient is opioid tolerant which makes pain control  quite an issue.  Per the patient her pain management physicians will not  provide pain medication for anything beyond what they are already treating which is her chronic back pain.  As an inpatient provider I can not provide an extended course of narcotics as I am unable to follow the patient outside of the hospital.  Discharge Instructions  Discharge Instructions     Diet - low sodium heart healthy   Complete by: As directed    Increase activity slowly   Complete by: As directed       Allergies as of 05/11/2021       Reactions   Keflex [cephalexin] Rash   Lithium Palpitations   Seizures    Opana [oxymorphone Hcl] Palpitations   Oxymorphone Other (See Comments)        Medication List     TAKE these medications    acetaminophen 500 MG tablet Commonly known as: TYLENOL Take 1,000 mg by mouth 3 (three) times daily.   ARIPiprazole 2 MG tablet Commonly known as: ABILIFY Take 2 mg by mouth daily.   aspirin 81 MG chewable tablet Chew 81 mg by mouth 2 (two) times daily.   buprenorphine 8 MG Subl SL tablet Commonly known as: SUBUTEX Place 8 mg under the tongue 2 (two) times daily.   Buprenorphine HCl-Naloxone HCl 8-2 MG Film Place 1 Film under the tongue daily. Takes at lunch   carisoprodol 350 MG tablet Commonly known as: SOMA Take 1 tablet by mouth 3 (three) times daily.   diphenhydrAMINE 50 MG capsule Commonly known as: BENADRYL Take 50 mg by mouth 3 (three) times daily.   FLUoxetine 20 MG capsule Commonly known as: PROZAC Take 1 capsule (20 mg total) by mouth daily.   labetalol 100 MG tablet Commonly known as: NORMODYNE Take 2 tablets (200 mg total) by mouth 2 (two) times daily.   metFORMIN 500 MG 24 hr tablet Commonly known as: GLUCOPHAGE-XR Take 500 mg by mouth 2 (two) times daily.   nicotine 21 mg/24hr patch Commonly known as: NICODERM CQ - dosed in mg/24 hours Place 1 patch (21 mg total) onto the skin daily. Start taking on: May 12, 2021   olmesartan-hydrochlorothiazide 40-25 MG tablet Commonly known as: BENICAR  HCT Take 1 tablet by mouth daily.   oxyCODONE-acetaminophen 10-325 MG tablet Commonly known as: Percocet Take 1 tablet by mouth every 4 (four) hours as needed for up to 7 days for pain.   pantoprazole 40 MG tablet Commonly known as: PROTONIX Take 1 tablet (40 mg total) by mouth daily for 14 days. Start taking on: May 12, 2021   pyridOXINE 50 MG tablet Commonly known as: VITAMIN B-6 Take 1 tablet (50 mg total) by mouth 3 (three) times daily.   sertraline 50 MG tablet Commonly known as: ZOLOFT Take 150 mg by mouth daily.        Allergies  Allergen Reactions   Keflex [Cephalexin] Rash   Lithium Palpitations    Seizures    Opana [Oxymorphone Hcl] Palpitations   Oxymorphone Other (See Comments)    Consultations: None   Procedures/Studies: CT ABDOMEN PELVIS WO CONTRAST  Result Date: 05/07/2021 CLINICAL DATA:  Epigastric abdominal pain. EXAM: CT ABDOMEN AND PELVIS WITHOUT CONTRAST TECHNIQUE: Multidetector CT imaging of the abdomen and pelvis was performed following the standard protocol without IV contrast. COMPARISON:  Ultrasound, same date. FINDINGS: Lower chest: The lung bases are clear of acute process. No pleural effusion or pulmonary lesions. The heart is normal in size. No  pericardial effusion. The distal esophagus and aorta are unremarkable. Hepatobiliary: No hepatic lesions are identified without contrast. No intrahepatic biliary dilatation. The ultrasound suggested fatty infiltration of the liver but this is not evident on the CT scan. The gallbladder demonstrates small layering gallstones but no CT findings to suggest acute cholecystitis. No common bile duct dilatation. Pancreas: The body and body tail junction region of the pancreas is enlarged, inflamed and edematous. There is also surrounding inflammatory changes. Findings consistent with acute focal pancreatitis. Spleen: Normal size.  No focal lesions. Adrenals/Urinary Tract: Adrenal glands and kidneys are  unremarkable. No renal calculi or hydronephrosis. No renal or bladder lesions are identified without contrast. Stomach/Bowel: The stomach, duodenum, small bowel and colon are grossly normal. No acute inflammatory process, mass lesions or obstructive findings. The terminal ileum is normal. The appendix is surgically absent. Vascular/Lymphatic: Age advanced distal aortic and iliac artery calcifications. No aneurysm. No mesenteric or retroperitoneal mass or adenopathy. Reproductive: The uterus and ovaries are unremarkable. Other: No pelvic mass or adenopathy. No free pelvic fluid collections. No inguinal mass or adenopathy. No abdominal wall hernia or subcutaneous lesions. Moderate debris in the vagina likely due to menses and possible tampon. Musculoskeletal: Stable fusion hardware at T11-12. 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 IMPRESSION: 1. CT findings consistent with acute focal pancreatitis involving the body and body tail junction region of the pancreas. 2. Cholelithiasis. 3. Age advanced distal aortic and iliac artery calcifications. Aortic Atherosclerosis (ICD10-I70.0). Electronically Signed   By: Rudie Meyer M.D.   On: 05/07/2021 11:51   US Abdomen Limited RUQ (LIVER/GB)  Result Date: 05/07/2021 CLINICAL DATA:  Right upper quadrant/epigastric pain. EXAM: ULTRASOUND ABDOMEN LIMITED RIGHT UPPER QUADRANT COMPARISON:  Ultrasounds 01/09/2017 FINDINGS: Gallbladder: Multiple echogenic shadowing gallstones are noted. No gallbladder wall thickening, pericholecystic fluid or sonographic Murphy sign to suggest acute cholecystitis. The largest calculus measures 9 mm. Common bile duct: Diameter: 4.3 mm Liver: Diffuse increased echogenicity of the liver suggesting fatty infiltration. No hepatic lesions or intrahepatic biliary dilatation. Portal vein is patent on color Doppler imaging with normal direction of blood flow towards the liver. Other: None. IMPRESSION: 1. Cholelithiasis without  sonographic findings for acute cholecystitis. 2. No intra or extrahepatic biliary dilatation. 3. Diffuse increased echogenicity of the liver suggesting fatty infiltration. Electronically Signed   By: Rudie Meyer M.D.   On: 05/07/2021 10:41      Subjective: Patient seen and examined on the day of discharge.  Still some abdominal pain but otherwise stable.  Discharge home at this time.  Discharge Exam: Vitals:   05/11/21 0519 05/11/21 0735  BP: 133/85 (!) 188/105  Pulse: 67 95  Resp: 14 16  Temp: 98 F (36.7 C) 98.2 F (36.8 C)  SpO2: 97% 98%   Vitals:   05/10/21 1525 05/10/21 2033 05/11/21 0519 05/11/21 0735  BP: 136/81 140/80 133/85 (!) 188/105  Pulse: 81 81 67 95  Resp: 12 16 14 16   Temp: 98.2 F (36.8 C) 97.9 F (36.6 C) 98 F (36.7 C) 98.2 F (36.8 C)  TempSrc: Oral Oral Oral Oral  SpO2: 97% 97% 97% 98%  Weight:      Height:        General: Pt is alert, awake, not in acute distress Cardiovascular: RRR, S1/S2 +, no rubs, no gallops Respiratory: CTA bilaterally, no wheezing, no rhonchi Abdominal: Soft, NT, ND, bowel sounds + Extremities: no edema, no cyanosis    The results of significant diagnostics from this hospitalization (including imaging, microbiology, ancillary  and laboratory) are listed below for reference.     Microbiology: Recent Results (from the past 240 hour(s))  Resp Panel by RT-PCR (Flu A&B, Covid) Nasopharyngeal Swab     Status: None   Collection Time: 05/07/21  8:53 PM   Specimen: Nasopharyngeal Swab; Nasopharyngeal(NP) swabs in vial transport medium  Result Value Ref Range Status   SARS Coronavirus 2 by RT PCR NEGATIVE NEGATIVE Final    Comment: (NOTE) SARS-CoV-2 target nucleic acids are NOT DETECTED.  The SARS-CoV-2 RNA is generally detectable in upper respiratory specimens during the acute phase of infection. The lowest concentration of SARS-CoV-2 viral copies this assay can detect is 138 copies/mL. A negative result does not preclude  SARS-Cov-2 infection and should not be used as the sole basis for treatment or other patient management decisions. A negative result may occur with  improper specimen collection/handling, submission of specimen other than nasopharyngeal swab, presence of viral mutation(s) within the areas targeted by this assay, and inadequate number of viral copies(<138 copies/mL). A negative result must be combined with clinical observations, patient history, and epidemiological information. The expected result is Negative.  Fact Sheet for Patients:  BloggerCourse.com  Fact Sheet for Healthcare Providers:  SeriousBroker.it  This test is no t yet approved or cleared by the [place] FDA and  has been authorized for detection and/or diagnosis of SARS-CoV-2 by FDA under an Emergency Use Authorization (EUA). This EUA will remain  in effect (meaning this test can be used) for the duration of the COVID-19 declaration under Section 564(b)(1) of the Act, 21 U.S.C.section 360bbb-3(b)(1), unless the authorization is terminated  or revoked sooner.       Influenza A by PCR NEGATIVE NEGATIVE Final   Influenza B by PCR NEGATIVE NEGATIVE Final    Comment: (NOTE) The Xpert Xpress SARS-CoV-2/FLU/RSV plus assay is intended as an aid in the diagnosis of influenza from Nasopharyngeal swab specimens and should not be used as a sole basis for treatment. Nasal washings and aspirates are unacceptable for Xpert Xpress SARS-CoV-2/FLU/RSV testing.  Fact Sheet for Patients: BloggerCourse.com  Fact Sheet for Healthcare Providers: SeriousBroker.it  This test is not yet approved or cleared by the [place] FDA and has been authorized for detection and/or diagnosis of SARS-CoV-2 by FDA under an Emergency Use Authorization (EUA). This EUA will remain in effect (meaning this test can be used) for the duration of  the COVID-19 declaration under Section 564(b)(1) of the Act, 21 U.S.C. section 360bbb-3(b)(1), unless the authorization is terminated or revoked.  Performed at [facility], [address] [place], [place] [address]      Labs: BNP (last 3 results) No results for input(s): BNP in the last 8760 hours. Basic Metabolic Panel: Recent Labs  Lab 05/07/21 0713 05/08/21 0650 05/09/21 0844 05/10/21 0735  NA 132* 134* 134* 135  K 3.7 3.5 3.5 3.4*  CL 103 103 102 99  CO2 18* 24 24 30   GLUCOSE 225* 164* 189* 201*  BUN 10 9 6 8   CREATININE 0.46 0.50 0.58 0.59  CALCIUM 8.7* 8.0* 8.3* 8.7*   Liver Function Tests: Recent Labs  Lab 05/07/21 0713 05/08/21 0650  AST 19 23  ALT 19 16  ALKPHOS 64 55  BILITOT 0.7 0.6  PROT 8.1 6.9  ALBUMIN 4.4 3.8   Recent Labs  Lab 05/07/21 0713 05/08/21 0650 05/09/21 0844 05/10/21 0735  LIPASE 249* 291* 77* 83*   No results for input(s): AMMONIA in the last 168 hours. CBC: Recent Labs  Lab 05/07/21 0713 05/08/21 0650 05/09/21 0844 05/10/21 0735  WBC 19.6* 11.9* 8.3 7.2  NEUTROABS  --   --  4.5 3.8  HGB 15.4* 12.7 11.9* 12.2  HCT 42.8 37.5 35.5* 36.2  MCV 90.3 94.0 94.2 94.0  PLT 453* 345 315 282   Cardiac Enzymes: No results for input(s): CKTOTAL, CKMB, CKMBINDEX, TROPONINI in the last 168 hours. BNP: Invalid input(s): POCBNP CBG: Recent Labs  Lab 05/09/21 1944  GLUCAP 217*   D-Dimer No results for input(s): DDIMER in the last 72 hours. Hgb A1c No results for input(s): HGBA1C in the last 72 hours. Lipid Profile No results for input(s): CHOL, HDL, LDLCALC, TRIG, CHOLHDL, LDLDIRECT in the last 72 hours. Thyroid function studies No results for input(s): TSH, T4TOTAL, T3FREE, THYROIDAB in the last 72 hours.  Invalid input(s): FREET3 Anemia work up No results for input(s): VITAMINB12, FOLATE, FERRITIN, TIBC, IRON, RETICCTPCT in the last 72 hours. Urinalysis    Component Value Date/Time   COLORURINE YELLOW (A)  05/07/2021 0957   APPEARANCEUR HAZY (A) 05/07/2021 0957   LABSPEC 1.028 05/07/2021 0957   PHURINE 5.0 05/07/2021 0957   GLUCOSEU 150 (A) 05/07/2021 0957   HGBUR NEGATIVE 05/07/2021 0957   BILIRUBINUR NEGATIVE 05/07/2021 0957   KETONESUR 5 (A) 05/07/2021 0957   PROTEINUR 30 (A) 05/07/2021 0957   NITRITE NEGATIVE 05/07/2021 0957   LEUKOCYTESUR NEGATIVE 05/07/2021 0957   Sepsis Labs Invalid input(s): PROCALCITONIN,  WBC,  LACTICIDVEN Microbiology Recent Results (from the past 240 hour(s))  Resp Panel by RT-PCR (Flu A&B, Covid) Nasopharyngeal Swab     Status: None   Collection Time: 05/07/21  8:53 PM   Specimen: Nasopharyngeal Swab; Nasopharyngeal(NP) swabs in vial transport medium  Result Value Ref Range Status   SARS Coronavirus 2 by RT PCR NEGATIVE NEGATIVE Final    Comment: (NOTE) SARS-CoV-2 target nucleic acids are NOT DETECTED.  The SARS-CoV-2 RNA is generally detectable in upper respiratory specimens during the acute phase of infection. The lowest concentration of SARS-CoV-2 viral copies this assay can detect is 138 copies/mL. A negative result does not preclude SARS-Cov-2 infection and should not be used as the sole basis for treatment or other patient management decisions. A negative result may occur with  improper specimen collection/handling, submission of specimen other than nasopharyngeal swab, presence of viral mutation(s) within the areas targeted by this assay, and inadequate number of viral copies(<138 copies/mL). A negative result must be combined with clinical observations, patient history, and epidemiological information. The expected result is Negative.  Fact Sheet for Patients:  BloggerCourse.com  Fact Sheet for Healthcare Providers:  SeriousBroker.it  This test is no t yet approved or cleared by the [place] FDA and  has been authorized for detection and/or diagnosis of SARS-CoV-2 by FDA under an  Emergency Use Authorization (EUA). This EUA will remain  in effect (meaning this test can be used) for the duration of the COVID-19 declaration under Section 564(b)(1) of the Act, 21 U.S.C.section 360bbb-3(b)(1), unless the authorization is terminated  or revoked sooner.       Influenza A by PCR NEGATIVE NEGATIVE Final   Influenza B by PCR NEGATIVE NEGATIVE Final    Comment: (NOTE) The Xpert Xpress SARS-CoV-2/FLU/RSV plus assay is intended as an aid in the diagnosis of influenza from Nasopharyngeal swab specimens and should not be used as a sole basis for treatment. Nasal washings and aspirates are unacceptable for Xpert Xpress SARS-CoV-2/FLU/RSV testing.  Fact Sheet for Patients: BloggerCourse.com  Fact Sheet for Healthcare Providers:  SeriousBroker.it  This test is not yet approved or cleared by the [place] and has been authorized for detection and/or diagnosis of SARS-CoV-2 by FDA under an Emergency Use Authorization (EUA). This EUA will remain in effect (meaning this test can be used) for the duration of the COVID-19 declaration under Section 564(b)(1) of the Act, 21 U.S.C. section 360bbb-3(b)(1), unless the authorization is terminated or revoked.  Performed at [facility], [address], [place], [place] [address]      Time coordinating discharge: Over 30 minutes  SIGNED:   Tresa Moore, MD  [facility] 05/11/2021, 1:54 PM Pager   If 7PM-7AM, please contact night-coverage

## 2021-05-18 ENCOUNTER — Emergency Department
Admission: EM | Admit: 2021-05-18 | Discharge: 2021-05-18 | Disposition: A | Discharge status: Home / Self Care | Payer: 59 | Attending: Emergency Medicine | Admitting: Emergency Medicine

## 2021-05-18 ENCOUNTER — Emergency Department: Payer: 59

## 2021-05-18 ENCOUNTER — Other Ambulatory Visit: Payer: Self-pay

## 2021-05-18 DIAGNOSIS — Z96652 Presence of left artificial knee joint: Secondary | ICD-10-CM | POA: Insufficient documentation

## 2021-05-18 DIAGNOSIS — Z7984 Long term (current) use of oral hypoglycemic drugs: Secondary | ICD-10-CM | POA: Diagnosis not present

## 2021-05-18 DIAGNOSIS — F17210 Nicotine dependence, cigarettes, uncomplicated: Secondary | ICD-10-CM | POA: Diagnosis not present

## 2021-05-18 DIAGNOSIS — R600 Localized edema: Secondary | ICD-10-CM | POA: Insufficient documentation

## 2021-05-18 DIAGNOSIS — K861 Other chronic pancreatitis: Secondary | ICD-10-CM | POA: Diagnosis not present

## 2021-05-18 DIAGNOSIS — Z7982 Long term (current) use of aspirin: Secondary | ICD-10-CM | POA: Diagnosis not present

## 2021-05-18 DIAGNOSIS — G5731 Lesion of lateral popliteal nerve, right lower limb: Secondary | ICD-10-CM | POA: Diagnosis not present

## 2021-05-18 DIAGNOSIS — R2241 Localized swelling, mass and lump, right lower limb: Secondary | ICD-10-CM | POA: Diagnosis present

## 2021-05-18 DIAGNOSIS — I10 Essential (primary) hypertension: Secondary | ICD-10-CM | POA: Diagnosis not present

## 2021-05-18 DIAGNOSIS — R609 Edema, unspecified: Secondary | ICD-10-CM

## 2021-05-18 LAB — COMPREHENSIVE METABOLIC PANEL
ALT: 25 U/L (ref 0–44)
AST: 29 U/L (ref 15–41)
Albumin: 3.7 g/dL (ref 3.5–5.0)
Alkaline Phosphatase: 51 U/L (ref 38–126)
Anion gap: 8 (ref 5–15)
BUN: 8 mg/dL (ref 6–20)
CO2: 23 mmol/L (ref 22–32)
Calcium: 8.5 mg/dL — ABNORMAL LOW (ref 8.9–10.3)
Chloride: 102 mmol/L (ref 98–111)
Creatinine, Ser: 0.49 mg/dL (ref 0.44–1.00)
GFR, Estimated: >60 mL/min (ref >60)
Glucose, Bld: 182 mg/dL — ABNORMAL HIGH (ref 70–99)
Potassium: 3.7 mmol/L (ref 3.5–5.1)
Sodium: 133 mmol/L — ABNORMAL LOW (ref 135–145)
Total Bilirubin: 0.6 mg/dL (ref 0.3–1.2)
Total Protein: 7 g/dL (ref 6.5–8.1)

## 2021-05-18 LAB — CBC
HCT: 36.9 % (ref 36.0–46.0)
Hemoglobin: 12.2 g/dL (ref 12.0–15.0)
MCH: 31 pg (ref 26.0–34.0)
MCHC: 33.1 g/dL (ref 30.0–36.0)
MCV: 93.9 fL (ref 80.0–100.0)
Platelets: 261 10*3/uL (ref 150–400)
RBC: 3.93 MIL/uL (ref 3.87–5.11)
RDW: 14.6 % (ref 11.5–15.5)
WBC: 8 10*3/uL (ref 4.0–10.5)
nRBC: 0 % (ref 0.0–0.2)

## 2021-05-18 LAB — BRAIN NATRIURETIC PEPTIDE: B Natriuretic Peptide: 32.5 pg/mL (ref 0.0–100.0)

## 2021-05-18 NOTE — Discharge Instructions (Signed)
Wear the walking boot while awake to maintain mobility in your ankle joint while your nerve is healing.  You should start doing gentle exercises for your leg to help recovery.  Continuing to walk (wearing the boot) is important.

## 2021-05-18 NOTE — ED Provider Notes (Signed)
[facility] Emergency Department Provider Note  ____________________________________________  Time seen: Approximately 9:30 PM  I have reviewed the triage vital signs and the nursing notes.   HISTORY  Chief Complaint Leg Swelling    HPI Breanna Mathews is a 35 y.o. female with a history of bipolar disorder, GERD, hypertension, opioid dependence, chronic pancreatitis who comes ED complaining of right lower leg numbness and inability to dorsiflex the ankle.  She reports that she has been sitting upright on the edge of her bed throughout the night the last several nights due to her persistent abdominal pain for which she was recently hospitalized and is following up with specialists.  It is not getting worse, and is gradually getting better.  However, last night while sitting upright on the edge of her bed she fell asleep, and when she woke up she noted bruising at the right lateral proximal lower leg just below the knee and the above symptoms.  No aggravating or alleviating factors.  No acute pain.  No chest pain or shortness of breath, no fever.  Otherwise in her baseline state of health.    Past Medical History:  Diagnosis Date   Angina of effort (HCC)    Anxiety    Bipolar disorder with depression (HCC)    Chronic back pain    GERD (gastroesophageal reflux disease)    Hypertension    Mitral valve prolapse      Patient Active Problem List   Diagnosis Date Noted   Pancreatitis 05/07/2021   Abdominal pain in pregnancy 01/09/2017   Drug-seeking behavior 07/30/2016   Opioid use agreement exists 07/30/2016   Acute low back pain 06/19/2016   Chronic jaw pain 01/16/2016   Oropharyngeal lesion 01/16/2016   Mid sternal chest pain 10/04/2015   Bipolar disorder with depression (HCC) 09/03/2015   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   Spell of altered consciousness 05/15/2015   Dependence on nicotine from cigarettes 04/10/2015   History of  hypertension 04/10/2015   Intentional fentanyl overdose (HCC) 04/10/2015   Sinus tachycardia 04/10/2015   Suicidal ideation 04/10/2015   Chronic pain disorder 04/09/2015   Dissociative episodes 04/09/2015   Mood disorder (HCC) 04/09/2015   Opioid use disorder, severe, dependence (HCC) 04/09/2015   Chronic bilateral low back pain with bilateral sciatica 03/08/2015   Borderline personality disorder (HCC) 01/16/2015   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) 06/22/2014   Uncomplicated opioid dependence (HCC) 06/22/2014   Essential (primary) hypertension 03/17/2013   Migraine with aura and without status migrainosus, not intractable 03/17/2013   Other specified anxiety disorders 03/17/2013   Spondylosis of cervical region without myelopathy or radiculopathy 07/11/2012   Spondylosis of thoracic region without myelopathy or radiculopathy 07/11/2012   Cannabis abuse, uncomplicated 11/27/2011     Past Surgical History:  Procedure Laterality Date   APPENDECTOMY     BACK SURGERY     CESAREAN SECTION     KNEE ARTHROPLASTY Left    x 2   POLYPECTOMY     TONSILLECTOMY       Prior to Admission medications   Medication Sig Start Date End Date Taking? Authorizing Provider  acetaminophen (TYLENOL) 500 MG tablet Take 1,000 mg by mouth 3 (three) times daily.    [provider]  ARIPiprazole (ABILIFY) 2 MG tablet Take 2 mg by mouth daily. 02/17/21   [provider]  aspirin 81 MG chewable tablet Chew 81 mg by mouth 2 (two) times daily.    [provider]  buprenorphine (SUBUTEX) 8 MG SUBL SL tablet Place 8 mg under the tongue 2 (two) times daily. 11/02/16   [provider]  Buprenorphine HCl-Naloxone HCl 8-2 MG FILM Place 1 Film under the tongue daily. Takes at lunch 05/10/21   [provider]  carisoprodol (SOMA) 350 MG tablet Take 1 tablet by mouth 3 (three) times daily. 04/21/21   [provider]  diphenhydrAMINE  (BENADRYL) 50 MG capsule Take 50 mg by mouth 3 (three) times daily.    [provider]  FLUoxetine (PROZAC) 20 MG capsule Take 1 capsule (20 mg total) by mouth daily. 08/03/20 08/03/21  Phineas Semen, MD  labetalol (NORMODYNE) 100 MG tablet Take 2 tablets (200 mg total) by mouth 2 (two) times daily. Patient not taking: Reported on 05/08/2021 12/20/16   Willy Eddy, MD  metFORMIN (GLUCOPHAGE-XR) 500 MG 24 hr tablet Take 500 mg by mouth 2 (two) times daily. 04/29/21   [provider]  nicotine (NICODERM CQ - DOSED IN MG/24 HOURS) 21 mg/24hr patch Place 1 patch (21 mg total) onto the skin daily. 05/12/21   Tresa Moore, MD  olmesartan-hydrochlorothiazide (BENICAR HCT) 40-25 MG tablet Take 1 tablet by mouth daily.    [provider]  oxyCODONE-acetaminophen (PERCOCET) 10-325 MG tablet Take 1 tablet by mouth every 4 (four) hours as needed for up to 7 days for pain. 05/11/21 05/18/21  Tresa Moore, MD  pantoprazole (PROTONIX) 40 MG tablet Take 1 tablet (40 mg total) by mouth daily for 14 days. 05/12/21 05/26/21  Tresa Moore, MD  pyridOXINE (VITAMIN B-6) 50 MG tablet Take 1 tablet (50 mg total) by mouth 3 (three) times daily. Patient not taking: Reported on 05/08/2021 11/17/16   Ward, Elenora Fender, MD  sertraline (ZOLOFT) 50 MG tablet Take 150 mg by mouth daily. Patient not taking: Reported on 05/08/2021 09/07/16   [provider]     Allergies Keflex [cephalexin], Lithium, Opana [oxymorphone hcl], and Oxymorphone   No family history on file.  Social History Social History   Tobacco Use   Smoking status: Every Day    Packs/day: 0.50    Types: Cigarettes   Smokeless tobacco: Never   Tobacco comments:    2 cigarettes/day  Vaping Use   Vaping Use: Never used  Substance Use Topics   Alcohol use: No   Drug use: No    Review of Systems  Constitutional:   No fever or chills.  ENT:   No sore throat. No rhinorrhea. Cardiovascular:   No  chest pain or syncope. Respiratory:   No dyspnea or cough. Gastrointestinal:   Negative for abdominal pain, vomiting and diarrhea.  Musculoskeletal:   Right leg numbness and weakness All other systems reviewed and are negative except as documented above in ROS and HPI.  ____________________________________________   PHYSICAL EXAM:  VITAL SIGNS: ED Triage Vitals  Enc Vitals Group     BP 05/18/21 1729 (!) 127/98     Pulse Rate 05/18/21 1729 (!) 102     Resp 05/18/21 1729 20     Temp 05/18/21 1733 98.1 F (36.7 C)     Temp Source 05/18/21 1733 Oral     SpO2 05/18/21 1729 99 %     Weight 05/18/21 1730 250 lb (113.4 kg)     Height 05/18/21 1730 5\' 9"  (1.753 m)     Head Circumference --      Peak Flow --      Pain Score 05/18/21 1730 0  Pain Loc --      Pain Edu? --      Excl. in [place]? --     Vital signs reviewed, nursing assessments reviewed.   Constitutional:   Alert and oriented. Non-toxic appearance. Eyes:   Conjunctivae are normal. EOMI.  ENT      Head:   Normocephalic and atraumatic.      Nose:   Wearing a mask.      Mouth/Throat:   Wearing a mask.      Neck:   No meningismus. Full ROM.  Cardiovascular:   RRR. Symmetric bilateral  DP pulses.  Cap refill less than 2 seconds. Respiratory:   Normal respiratory effort without tachypnea/retractions. Breath sounds are clear and equal bilaterally.  Gastrointestinal:   Soft and nontender. Non distended. There is no CVA tenderness.  No rebound, rigidity, or guarding. Genitourinary:   deferred Musculoskeletal:   There is 1+ pitting edema bilateral lower extremities, which is symmetric.  No calf tenderness.  She has weakness of dorsiflexion of the right ankle.  She is able to plantarflex.  There is numbness in the distribution of the common peroneal nerve.  There is a linear shaped compression wound overlying the lateral aspect of the fibular neck. Neurologic:   Normal speech and language.  Motor grossly intact. No acute focal  neurologic deficits are appreciated.  Skin:    Skin is warm, dry . No rash noted.  No petechiae, purpura, or bullae.  ____________________________________________    LABS (pertinent positives/negatives) (all labs ordered are listed, but only abnormal results are displayed) Labs Reviewed  COMPREHENSIVE METABOLIC PANEL - Abnormal; Notable for the following components:      Result Value   Sodium 133 (*)    Glucose, Bld 182 (*)    Calcium 8.5 (*)    All other components within normal limits  CBC  BRAIN NATRIURETIC PEPTIDE   ____________________________________________   EKG    ____________________________________________    RADIOLOGY  US Venous Img Lower Unilateral Right  Result Date: 05/18/2021 CLINICAL DATA:  Right lower extremity paresthesias, edema EXAM: RIGHT LOWER EXTREMITY VENOUS DOPPLER ULTRASOUND TECHNIQUE: Gray-scale sonography with compression, as well as color and duplex ultrasound, were performed to evaluate the deep venous system(s) from the level of the common femoral vein through the popliteal and proximal calf veins. COMPARISON:  None. FINDINGS: VENOUS Normal compressibility of the common femoral, superficial femoral, and popliteal veins, as well as the visualized calf veins. Visualized portions of profunda femoral vein and great saphenous vein unremarkable. No filling defects to suggest DVT on grayscale or color Doppler imaging. Doppler waveforms show normal direction of venous flow, normal respiratory plasticity and response to augmentation. Limited views of the contralateral common femoral vein are unremarkable. OTHER None. Limitations: none IMPRESSION: Negative. Electronically Signed   By: Rolm Baptise M.D.   On: 05/18/2021 18:56    ____________________________________________   PROCEDURES Procedures  ____________________________________________    CLINICAL IMPRESSION / ASSESSMENT AND PLAN / ED COURSE  Medications ordered in the ED: Medications - No  data to display  Pertinent labs & imaging results that were available during my care of the patient were reviewed by me and considered in my medical decision making (see chart for details).  Breanna Mathews was evaluated in Emergency Department on 05/18/2021 for the symptoms described in the history of present illness. She was evaluated in the context of the global COVID-19 pandemic, which necessitated consideration that the patient might be at risk for infection with the SARS-CoV-2 virus  that causes COVID-19. Institutional protocols and algorithms that pertain to the evaluation of patients at risk for COVID-19 are in a state of rapid change based on information released by regulatory bodies including the CDC and federal and state organizations. These policies and algorithms were followed during the patient's care in the ED.   Patient presents with symptoms of right common peroneal nerve palsy.  Labs are normal, vital signs unremarkable.  Ultrasound negative for DVT.  No symptoms to suggest PE.  She does not have cellulitis or others soft tissue infection, doubt septic arthritis or osteomyelitis.  Cam walking boot provided.  Follow-up with orthopedics.  Rehab information provided.      ____________________________________________   FINAL CLINICAL IMPRESSION(S) / ED DIAGNOSES    Final diagnoses:  Right peroneal nerve palsy  Dependent edema  Chronic pancreatitis, unspecified pancreatitis type [facility])     ED Discharge Orders     None       Portions of this note were generated with dragon dictation software. Dictation errors may occur despite best attempts at proofreading.    Carrie Mew, MD 05/18/21 2135

## 2021-05-18 NOTE — ED Notes (Signed)
Pt given dc ppw. Pt given followup information. Pt given cam boot and instructions for care and use. Pt gave verbal consent for dc. Pt assisted off unit in wheelchair to front entrance.
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
## 2021-05-30 ENCOUNTER — Emergency Department
Admission: EM | Admit: 2021-05-30 | Discharge: 2021-05-30 | Disposition: A | Discharge status: Home / Self Care | Payer: 59 | Attending: Emergency Medicine | Admitting: Emergency Medicine

## 2021-05-30 ENCOUNTER — Encounter: Payer: Self-pay | Admitting: Emergency Medicine

## 2021-05-30 ENCOUNTER — Emergency Department: Payer: 59

## 2021-05-30 ENCOUNTER — Other Ambulatory Visit: Payer: Self-pay

## 2021-05-30 DIAGNOSIS — G8929 Other chronic pain: Secondary | ICD-10-CM | POA: Insufficient documentation

## 2021-05-30 DIAGNOSIS — F17210 Nicotine dependence, cigarettes, uncomplicated: Secondary | ICD-10-CM | POA: Diagnosis not present

## 2021-05-30 DIAGNOSIS — Z79899 Other long term (current) drug therapy: Secondary | ICD-10-CM | POA: Diagnosis not present

## 2021-05-30 DIAGNOSIS — R112 Nausea with vomiting, unspecified: Secondary | ICD-10-CM | POA: Diagnosis not present

## 2021-05-30 DIAGNOSIS — R739 Hyperglycemia, unspecified: Secondary | ICD-10-CM | POA: Diagnosis not present

## 2021-05-30 DIAGNOSIS — R1013 Epigastric pain: Secondary | ICD-10-CM | POA: Diagnosis not present

## 2021-05-30 DIAGNOSIS — Z96652 Presence of left artificial knee joint: Secondary | ICD-10-CM | POA: Insufficient documentation

## 2021-05-30 DIAGNOSIS — Z7982 Long term (current) use of aspirin: Secondary | ICD-10-CM | POA: Insufficient documentation

## 2021-05-30 DIAGNOSIS — I10 Essential (primary) hypertension: Secondary | ICD-10-CM | POA: Insufficient documentation

## 2021-05-30 LAB — URINALYSIS, ROUTINE W REFLEX MICROSCOPIC
Bilirubin Urine: NEGATIVE
Glucose, UA: NEGATIVE mg/dL
Hgb urine dipstick: NEGATIVE
Ketones, ur: NEGATIVE mg/dL
Leukocytes,Ua: NEGATIVE
Nitrite: NEGATIVE
Protein, ur: NEGATIVE mg/dL
Specific Gravity, Urine: 1.025 (ref 1.005–1.030)
pH: 5 (ref 5.0–8.0)

## 2021-05-30 LAB — TROPONIN I (HIGH SENSITIVITY): Troponin I (High Sensitivity): 4 ng/L (ref <18)

## 2021-05-30 LAB — CBC
HCT: 42 % (ref 36.0–46.0)
Hemoglobin: 14.1 g/dL (ref 12.0–15.0)
MCH: 31.5 pg (ref 26.0–34.0)
MCHC: 33.6 g/dL (ref 30.0–36.0)
MCV: 93.8 fL (ref 80.0–100.0)
Platelets: 324 10*3/uL (ref 150–400)
RBC: 4.48 MIL/uL (ref 3.87–5.11)
RDW: 13.9 % (ref 11.5–15.5)
WBC: 11.4 10*3/uL — ABNORMAL HIGH (ref 4.0–10.5)
nRBC: 0 % (ref 0.0–0.2)

## 2021-05-30 LAB — COMPREHENSIVE METABOLIC PANEL
ALT: 15 U/L (ref 0–44)
AST: 15 U/L (ref 15–41)
Albumin: 4.3 g/dL (ref 3.5–5.0)
Alkaline Phosphatase: 59 U/L (ref 38–126)
Anion gap: 6 (ref 5–15)
BUN: 8 mg/dL (ref 6–20)
CO2: 23 mmol/L (ref 22–32)
Calcium: 9.1 mg/dL (ref 8.9–10.3)
Chloride: 102 mmol/L (ref 98–111)
Creatinine, Ser: 0.7 mg/dL (ref 0.44–1.00)
GFR, Estimated: >60 mL/min (ref >60)
Glucose, Bld: 211 mg/dL — ABNORMAL HIGH (ref 70–99)
Potassium: 4.3 mmol/L (ref 3.5–5.1)
Sodium: 131 mmol/L — ABNORMAL LOW (ref 135–145)
Total Bilirubin: 0.4 mg/dL (ref 0.3–1.2)
Total Protein: 7.8 g/dL (ref 6.5–8.1)

## 2021-05-30 LAB — LIPASE, BLOOD: Lipase: 94 U/L — ABNORMAL HIGH (ref 11–51)

## 2021-05-30 LAB — PREGNANCY, URINE: Preg Test, Ur: NEGATIVE

## 2021-05-30 MED ORDER — MORPHINE SULFATE (PF) 4 MG/ML IV SOLN
6.0000 mg | Freq: Once | INTRAVENOUS | Status: AC
  Administered 2021-05-30: 6 mg via INTRAVENOUS
  Filled 2021-05-30: qty 2

## 2021-05-30 MED ORDER — ONDANSETRON HCL 4 MG/2ML IJ SOLN
4.0000 mg | Freq: Once | INTRAMUSCULAR | Status: AC
  Administered 2021-05-30: 4 mg via INTRAVENOUS
  Filled 2021-05-30: qty 2

## 2021-05-30 MED ORDER — ALUM & MAG HYDROXIDE-SIMETH 200-200-20 MG/5ML PO SUSP
30.0000 mL | Freq: Once | ORAL | Status: AC
  Administered 2021-05-30: 30 mL via ORAL
  Filled 2021-05-30: qty 30

## 2021-05-30 MED ORDER — PROCHLORPERAZINE EDISYLATE 10 MG/2ML IJ SOLN
10.0000 mg | Freq: Once | INTRAMUSCULAR | Status: AC
  Administered 2021-05-30: 10 mg via INTRAVENOUS
  Filled 2021-05-30: qty 2

## 2021-05-30 MED ORDER — KETOROLAC TROMETHAMINE 30 MG/ML IJ SOLN
15.0000 mg | Freq: Once | INTRAMUSCULAR | Status: AC
  Administered 2021-05-30: 15 mg via INTRAVENOUS
  Filled 2021-05-30: qty 1

## 2021-05-30 MED ORDER — OXYCODONE-ACETAMINOPHEN 10-325 MG PO TABS: 1.0000 | ORAL_TABLET | Freq: Four times a day (QID) | ORAL | 0 refills | Status: DC | PRN

## 2021-05-30 MED ORDER — MORPHINE SULFATE (PF) 2 MG/ML IV SOLN
2.0000 mg | Freq: Once | INTRAVENOUS | Status: AC
  Administered 2021-05-30: 2 mg via INTRAVENOUS
  Filled 2021-05-30: qty 1

## 2021-05-30 MED ORDER — LIDOCAINE VISCOUS HCL 2 % MT SOLN
15.0000 mL | Freq: Once | OROMUCOSAL | Status: AC
  Administered 2021-05-30: 15 mL via ORAL
  Filled 2021-05-30: qty 15

## 2021-05-30 MED ORDER — PANTOPRAZOLE SODIUM 40 MG PO TBEC: 40.0000 mg | DELAYED_RELEASE_TABLET | Freq: Every day | ORAL | 1 refills | Status: DC

## 2021-05-30 MED ORDER — LACTATED RINGERS IV BOLUS
1000.0000 mL | Freq: Once | INTRAVENOUS | Status: AC
  Administered 2021-05-30: 1000 mL via INTRAVENOUS

## 2021-05-30 MED ORDER — IOHEXOL 300 MG/ML  SOLN
100.0000 mL | Freq: Once | INTRAMUSCULAR | Status: AC | PRN
  Administered 2021-05-30: 100 mL via INTRAVENOUS

## 2021-05-30 MED ORDER — ONDANSETRON 4 MG PO TBDP
4.0000 mg | ORAL_TABLET | Freq: Three times a day (TID) | ORAL | 0 refills | Status: DC | PRN
Start: 2021-05-30 — End: 2022-07-23

## 2021-05-30 MED ORDER — METOCLOPRAMIDE HCL 5 MG/ML IJ SOLN
10.0000 mg | Freq: Once | INTRAMUSCULAR | Status: AC
  Administered 2021-05-30: 10 mg via INTRAVENOUS
  Filled 2021-05-30: qty 2

## 2021-05-30 NOTE — ED Provider Notes (Signed)
[facility] Emergency Department Provider Note  ____________________________________________   Event Date/Time   First MD Initiated Contact with Patient 05/30/21 1319     (approximate)  I have reviewed the triage vital signs and the nursing notes.   HISTORY  Chief Complaint Abdominal Pain   HPI Breanna Mathews is a 35 y.o. female with medical history significant of chronic pain on Subutex, back pain, anxiety, GERD, hypertension, mitral valve prolapse and recent admission 11/2-11/6 for acute pancreatitis of unclear etiology who presents for assessment stating she has had acute worsening of her epigastric pain associate with nausea and vomiting starting yesterday.  Patient states she has not felt well since leaving the hospital and has been spacing out her prescribed Percocets which she is taking with her Subutex and those last until yesterday.  She denies any diarrhea, urinary symptoms, lower abdominal pain, chest pain, cough, shortness of breath, headache, earache, sore throat, rash or recent injuries or falls.  Denies any illicit drug use, EtOH use or significant NSAID use.  No other acute concerns at this time.         Past Medical History:  Diagnosis Date   Angina of effort ([place])    Anxiety    Bipolar disorder with depression ([place])    Chronic back pain    GERD (gastroesophageal reflux disease)    Hypertension    Mitral valve prolapse     Patient Active Problem List   Diagnosis Date Noted   Pancreatitis 05/07/2021   Abdominal pain in pregnancy 01/09/2017   Drug-seeking behavior 07/30/2016   Opioid use agreement exists 07/30/2016   Acute low back pain 06/19/2016   Chronic jaw pain 01/16/2016   Oropharyngeal lesion 01/16/2016   Mid sternal chest pain 10/04/2015   Bipolar disorder with depression ([place]) 09/03/2015   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   Spell of altered consciousness 05/15/2015   Dependence on nicotine from cigarettes  04/10/2015   History of hypertension 04/10/2015   Intentional fentanyl overdose ([place]) 04/10/2015   Sinus tachycardia 04/10/2015   Suicidal ideation 04/10/2015   Chronic pain disorder 04/09/2015   Dissociative episodes 04/09/2015   Mood disorder ([place]) 04/09/2015   Opioid use disorder, severe, dependence ([place]) 04/09/2015   Chronic bilateral low back pain with bilateral sciatica 03/08/2015   Borderline personality disorder ([place]) 01/16/2015   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 ([place]) [id number]   Uncomplicated opioid dependence ([place]) 06/22/2014   Essential (primary) hypertension 03/17/2013   Migraine with aura and without status migrainosus, not intractable 03/17/2013   Other specified anxiety disorders 03/17/2013   Spondylosis of cervical region without myelopathy or radiculopathy 07/11/2012   Spondylosis of thoracic region without myelopathy or radiculopathy 07/11/2012   Cannabis abuse, uncomplicated 0000000    Past Surgical History:  Procedure Laterality Date   APPENDECTOMY     BACK SURGERY     CESAREAN SECTION     KNEE ARTHROPLASTY Left    x 2   POLYPECTOMY     TONSILLECTOMY      Prior to Admission medications   Medication Sig Start Date End Date Taking? Authorizing Provider  acetaminophen (TYLENOL) 500 MG tablet Take 1,000 mg by mouth 3 (three) times daily.    [provider]  ARIPiprazole (ABILIFY) 2 MG tablet Take 2 mg by mouth daily. 02/17/21   [provider]  aspirin 81 MG chewable tablet Chew 81 mg by mouth 2 (two) times daily.    [provider]  buprenorphine (  SUBUTEX) 8 MG SUBL SL tablet Place 8 mg under the tongue 2 (two) times daily. 11/02/16   [provider]  Buprenorphine HCl-Naloxone HCl 8-2 MG FILM Place 1 Film under the tongue daily. Takes at lunch 05/10/21   [provider]  carisoprodol (SOMA) 350 MG tablet Take 1 tablet by mouth 3 (three) times daily. 04/21/21   [provider]  diphenhydrAMINE (BENADRYL) 50 MG capsule Take 50 mg by mouth 3 (three) times daily.    [provider]  FLUoxetine (PROZAC) 20 MG capsule Take 1 capsule (20 mg total) by mouth daily. 08/03/20 08/03/21  Phineas Semen, MD  labetalol (NORMODYNE) 100 MG tablet Take 2 tablets (200 mg total) by mouth 2 (two) times daily. Patient not taking: Reported on 05/08/2021 12/20/16   Willy Eddy, MD  metFORMIN (GLUCOPHAGE-XR) 500 MG 24 hr tablet Take 500 mg by mouth 2 (two) times daily. 04/29/21   [provider]  nicotine (NICODERM CQ - DOSED IN MG/24 HOURS) 21 mg/24hr patch Place 1 patch (21 mg total) onto the skin daily. 05/12/21   Tresa Moore, MD  olmesartan-hydrochlorothiazide (BENICAR HCT) 40-25 MG tablet Take 1 tablet by mouth daily.    [provider]  pantoprazole (PROTONIX) 40 MG tablet Take 1 tablet (40 mg total) by mouth daily for 14 days. 05/12/21 05/26/21  Tresa Moore, MD  pyridOXINE (VITAMIN B-6) 50 MG tablet Take 1 tablet (50 mg total) by mouth 3 (three) times daily. Patient not taking: Reported on 05/08/2021 11/17/16   Ward, Elenora Fender, MD  sertraline (ZOLOFT) 50 MG tablet Take 150 mg by mouth daily. Patient not taking: Reported on 05/08/2021 09/07/16   [provider]    Allergies Keflex [cephalexin], Lithium, Opana [oxymorphone hcl], and Oxymorphone  No family history on file.  Social History Social History   Tobacco Use   Smoking status: Every Day    Packs/day: 0.50    Types: Cigarettes   Smokeless tobacco: Never   Tobacco comments:    2 cigarettes/day  Vaping Use   Vaping Use: Never used  Substance Use Topics   Alcohol use: No   Drug use: No    Review of Systems  Review of Systems  Constitutional:  Negative for chills and fever.  HENT:  Negative for sore throat.   Eyes:  Negative for pain.  Respiratory:  Negative for cough and stridor.   Cardiovascular:  Negative for chest pain.  Gastrointestinal:  Positive for  abdominal pain, nausea and vomiting.  Genitourinary:  Negative for dysuria.  Musculoskeletal:  Negative for myalgias.  Skin:  Negative for rash.  Neurological:  Negative for seizures, loss of consciousness and headaches.  Psychiatric/Behavioral:  Negative for suicidal ideas.   All other systems reviewed and are negative.    ____________________________________________   PHYSICAL EXAM:  VITAL SIGNS: ED Triage Vitals  Enc Vitals Group     BP 05/30/21 1204 (!) 147/93     Pulse Rate 05/30/21 1204 94     Resp 05/30/21 1204 18     Temp 05/30/21 1204 98.2 F (36.8 C)     Temp Source 05/30/21 1204 Oral     SpO2 05/30/21 1204 98 %     Weight 05/30/21 1042 251 lb 5.2 oz (114 kg)     Height 05/30/21 1042 5\' 9"  (1.753 m)     Head Circumference --      Peak Flow --      Pain Score 05/30/21 1042 7  Pain Loc --      Pain Edu? --      Excl. in [place]? --    Vitals:   05/30/21 1204  BP: (!) 147/93  Pulse: 94  Resp: 18  Temp: 98.2 F (36.8 C)  SpO2: 98%   Physical Exam Vitals and nursing note reviewed.  Constitutional:      General: She is not in acute distress.    Appearance: She is well-developed. She is ill-appearing.  HENT:     Head: Normocephalic and atraumatic.     Right Ear: External ear normal.     Left Ear: External ear normal.     Nose: Nose normal.     Mouth/Throat:     Mouth: Mucous membranes are dry.  Eyes:     Conjunctiva/sclera: Conjunctivae normal.  Cardiovascular:     Rate and Rhythm: Normal rate and regular rhythm.     Heart sounds: No murmur heard. Pulmonary:     Effort: Pulmonary effort is normal. No respiratory distress.     Breath sounds: Normal breath sounds.  Abdominal:     Palpations: Abdomen is soft.     Tenderness: There is abdominal tenderness in the epigastric area. There is no right CVA tenderness or left CVA tenderness.  Musculoskeletal:        General: No swelling.     Cervical back: Neck supple.  Skin:    General: Skin is warm and  dry.     Capillary Refill: Capillary refill takes 2 to 3 seconds.  Neurological:     Mental Status: She is alert and oriented to person, place, and time.  Psychiatric:        Mood and Affect: Mood normal.     ____________________________________________   LABS (all labs ordered are listed, but only abnormal results are displayed)  Labs Reviewed  LIPASE, BLOOD - Abnormal; Notable for the following components:      Result Value   Lipase 94 (*)    All other components within normal limits  COMPREHENSIVE METABOLIC PANEL - Abnormal; Notable for the following components:   Sodium 131 (*)    Glucose, Bld 211 (*)    All other components within normal limits  CBC - Abnormal; Notable for the following components:   WBC 11.4 (*)    All other components within normal limits  URINALYSIS, ROUTINE W REFLEX MICROSCOPIC - Abnormal; Notable for the following components:   Color, Urine YELLOW (*)    APPearance CLEAR (*)    Bacteria, UA MANY (*)    All other components within normal limits  URINE CULTURE  PREGNANCY, URINE   ____________________________________________  EKG  ____________________________________________  RADIOLOGY  ED MD interpretation:   Official radiology report(s): No results found.  ____________________________________________   PROCEDURES  Procedure(s) performed (including Critical Care):  Procedures   ____________________________________________   INITIAL IMPRESSION / ASSESSMENT AND PLAN / ED COURSE        Patient presents with above-stated history exam for assessment of recurrence of fairly severe epigastric pain rating to the back associate with nausea and vomiting that she states feels similar to acute pancreatitis requiring hospitalization earlier this month.  She states she ran of her Percocet yesterday and that her pain clinic would not prescribe her any additional pain medicines.  She states it feels like her pancreatitis is back.  On arrival  she is afebrile hemodynamically stable.  She is tender epigastrium appears mildly dehydrated.  However she has no significant lower abdominal tenderness.  Differential  includes possible recurrence of pancreatitis, gastritis, cystitis, pyelonephritis, kidney stone, cholecystitis, choledocholithiasis and metabolic derangements.  CMP shows a glucose of 211 without other significant electrolyte metabolic derangements.  CBC shows a WBC count of 11.4 without evidence of acute anemia and normal platelets.  Lipase at 94 slightly elevated but not greater than 3 times upper limit of normal.  UA with some bacteria and 11-20 WBCs but given absence of any lower abdominal pain or dysuria lower suspicion for significant cystitis at this time.  Pregnancy test is negative.  Will obtain a CT to assess for possible ongoing pancreatitis versus other acute abdominal pathologies including cholecystitis, appendicitis, pyelonephritis, kidney stone.  Care patient signed over to assuming father approximately 1500.  Plan is to follow-up CT and reassess.      ____________________________________________   FINAL CLINICAL IMPRESSION(S) / ED DIAGNOSES  Final diagnoses:  Epigastric pain  Nausea and vomiting, unspecified vomiting type  Other chronic pain  Hyperglycemia  Hypertension, unspecified type    Medications  iohexol (OMNIPAQUE) 300 MG/ML solution 100 mL (has no administration in time range)  metoCLOPramide (REGLAN) injection 10 mg (has no administration in time range)  morphine 4 MG/ML injection 6 mg (has no administration in time range)  ketorolac (TORADOL) 30 MG/ML injection 15 mg (has no administration in time range)  morphine 4 MG/ML injection 6 mg (6 mg Intravenous Given 05/30/21 1410)  ondansetron (ZOFRAN) injection 4 mg (4 mg Intravenous Given 05/30/21 1409)  lactated ringers bolus 1,000 mL (1,000 mLs Intravenous New Bag/Given 05/30/21 1410)     ED Discharge Orders     None        Note:   This document was prepared using Dragon voice recognition software and may include unintentional dictation errors.    Lucrezia Starch, MD 05/30/21 [phone]

## 2021-05-30 NOTE — ED Notes (Signed)
Pt reported continued vomiting despite antinausea medication before and that pain has returned. She c/o pain as 6/10 in her abdomen radiating to her back and said the pain medication worked for approx. 45 minutes then wore off. EDP notified

## 2021-05-30 NOTE — ED Provider Notes (Addendum)
Patient reports horrible pain with any meals now.  Pain is epigastric and runs around to the right side to the back.  Also pain in the right upper quadrant.  She says she has been taking her Percocet tens of breaking them in half.  She wants some more.  She is ran out of them yesterday.  I told her I can only give her a few.  I have given her also prescription for Zofran and some Protonix in case it is her stomach.  I will have her follow-up with GI and also referred her back to her surgeon.   Arnaldo Natal, MD 05/30/21 2042 Patient also had a history of effort angina.  Troponin was done was negative.  EKG was done x2 initially showed normal sinus rhythm rate of 79 normal axis slight flipped T in 1 and L but no other changes second 1 also showed normal sinus rhythm rate of 79 normal axis slight for flipped T in 1 and L with no other changes no change between EKGs these changes were not present in April of this year April 24.  EKGs done in November may have had these changes present but the baseline was very irregular and difficult to tell.  There are no obvious acute changes and again the troponin was negative so I do not believe this will need further follow-up at this time.Arnaldo Natal, MD 05/30/21 2056

## 2021-05-30 NOTE — ED Notes (Signed)
Pt reported return of severe abdominal pain and nausea. EDP notified.

## 2021-05-30 NOTE — ED Notes (Signed)
Pt walked to ED room 21. Steady gait noted. Pt now in bed, sitting, rocking back and forth in pain. Pt states was recently hospitalized for pancreatitis and gallstones (2-3 wk ago) and was discharged with PO percocet rx for 1 week which she made last until yesterday.   States has felt bad since discharge but now the pain is intolerable, upper  abdomen/epigastric area, sharp and constant shooting to mid back.   LBM yesterday. Denies urinary symptoms. Feels constant nausea. Vomited 2X yesterday.

## 2021-05-30 NOTE — ED Notes (Signed)
Pt to CT

## 2021-05-30 NOTE — Discharge Instructions (Addendum)
Please call your surgeon again and asked them about when they will schedule your surgery.  Also please call Dr. Okey Dupre on the gastroenterologist for follow-up.  It probably would be best if you would back to clear liquid diet for the next day or 2 that would be clear broth Jell-O or anything you can see through.  Progress from that till full liquid diet with limited fat.  Do that for couple days as well.  Then try regular food.  Please return for increasing pain, fever, vomiting or any other problems. Take the Protonix 1 pill a day just in case you are having any trouble with ulcers that will help.

## 2021-05-30 NOTE — ED Notes (Signed)
Lab called to add on urine preg  

## 2021-05-30 NOTE — ED Notes (Addendum)
Pt reports increased pain and nausea. Reports Dilaudid helped better previously and lasted longer, up to a few hours. Pt also inquiring about Buprenorphine because has not had in two days. Let pt know I would inform doctor

## 2021-05-30 NOTE — ED Notes (Addendum)
Messaged doctor regarding pt concerned about missing Wellbutrin for two days and requesting med.

## 2021-05-30 NOTE — ED Notes (Signed)
EDP made aware of pts pain. Orders for medications, repeat EKG and trop added

## 2021-06-01 ENCOUNTER — Emergency Department
Admission: EM | Admit: 2021-06-01 | Discharge: 2021-06-01 | Disposition: A | Discharge status: Home / Self Care | Payer: 59 | Attending: Emergency Medicine | Admitting: Emergency Medicine

## 2021-06-01 DIAGNOSIS — Z5321 Procedure and treatment not carried out due to patient leaving prior to being seen by health care provider: Secondary | ICD-10-CM | POA: Diagnosis not present

## 2021-06-01 DIAGNOSIS — R109 Unspecified abdominal pain: Secondary | ICD-10-CM | POA: Insufficient documentation

## 2021-06-01 LAB — URINE CULTURE

## 2022-02-28 ENCOUNTER — Other Ambulatory Visit: Payer: Self-pay

## 2022-02-28 ENCOUNTER — Emergency Department: Payer: 59

## 2022-02-28 ENCOUNTER — Encounter: Payer: Self-pay | Admitting: Emergency Medicine

## 2022-02-28 ENCOUNTER — Emergency Department
Admission: EM | Admit: 2022-02-28 | Discharge: 2022-02-28 | Discharge status: Against Medical Advice | Payer: 59 | Attending: Emergency Medicine | Admitting: Emergency Medicine

## 2022-02-28 DIAGNOSIS — Z5321 Procedure and treatment not carried out due to patient leaving prior to being seen by health care provider: Secondary | ICD-10-CM | POA: Diagnosis not present

## 2022-02-28 DIAGNOSIS — R569 Unspecified convulsions: Secondary | ICD-10-CM | POA: Insufficient documentation

## 2022-02-28 DIAGNOSIS — R55 Syncope and collapse: Secondary | ICD-10-CM | POA: Insufficient documentation

## 2022-02-28 DIAGNOSIS — M542 Cervicalgia: Secondary | ICD-10-CM | POA: Insufficient documentation

## 2022-02-28 LAB — CBC
HCT: 44.9 % (ref 36.0–46.0)
Hemoglobin: 14.9 g/dL (ref 12.0–15.0)
MCH: 29.6 pg (ref 26.0–34.0)
MCHC: 33.2 g/dL (ref 30.0–36.0)
MCV: 89.3 fL (ref 80.0–100.0)
Platelets: 331 10*3/uL (ref 150–400)
RBC: 5.03 MIL/uL (ref 3.87–5.11)
RDW: 13.4 % (ref 11.5–15.5)
WBC: 14.5 10*3/uL — ABNORMAL HIGH (ref 4.0–10.5)
nRBC: 0 % (ref 0.0–0.2)

## 2022-02-28 LAB — BASIC METABOLIC PANEL
Anion gap: 11 (ref 5–15)
BUN: 7 mg/dL (ref 6–20)
CO2: 21 mmol/L — ABNORMAL LOW (ref 22–32)
Calcium: 9.1 mg/dL (ref 8.9–10.3)
Chloride: 105 mmol/L (ref 98–111)
Creatinine, Ser: 0.78 mg/dL (ref 0.44–1.00)
GFR, Estimated: >60 mL/min (ref >60)
Glucose, Bld: 156 mg/dL — ABNORMAL HIGH (ref 70–99)
Potassium: 3.9 mmol/L (ref 3.5–5.1)
Sodium: 137 mmol/L (ref 135–145)

## 2022-02-28 NOTE — ED Triage Notes (Signed)
Pt called for CT scan, no response 

## 2022-02-28 NOTE — ED Triage Notes (Signed)
Pt called for CT scan, no response

## 2022-02-28 NOTE — ED Triage Notes (Signed)
Pt called x's 2 for CT scan, no response. Pt now called for treatment room and CT scan, no response. Not able to locate pt in bathroom or outside. Called pt cellphone no answer.

## 2022-02-28 NOTE — ED Provider Triage Note (Signed)
Emergency Medicine Provider Triage Evaluation Note  Breanna Mathews, a 36 y.o. female  was evaluated in triage.  Pt complains of a history of seizures, presents to the ED with onset of left-sided neck pain with referral into the left side of her head approximately 10 minutes prior to arrival.  She reports her son was driving, and she apparently passed out for approximately 10 minutes and then experienced seizure-like activity.  Patient reports regular Lamictal doses without any missed doses patient presents to the ED noted only a tingling to the left side of the head and neck.  Denies any bladder or bowel incontinence.  Review of Systems  Positive: Syncope, seizure activity Negative: Paralysis, weakness  Physical Exam  BP (!) 162/95 (BP Location: Left Arm)   Pulse (!) 114   Temp 98.1 F (36.7 C) (Oral)   Resp 20   Ht 5\' 9"  (1.753 m)   Wt 107 kg   LMP 02/14/2022   SpO2 98%   BMI 34.85 kg/m  Gen:   Awake, no distress  NAD Resp:  Normal effort CTA MSK:   Moves extremities without difficulty  CVS:  RRR  Medical Decision Making  Medically screening exam initiated at 11:40 AM.  Appropriate orders placed.  Shaivi Tieken was informed that the remainder of the evaluation will be completed by another provider, this initial triage assessment does not replace that evaluation, and the importance of remaining in the ED until their evaluation is complete.  Patient to the ED for evaluation of left-sided neck and head pain with onset of seizure activity 10 minutes about prior arrival.   04/16/2022, PA-C 02/28/22 1142

## 2022-02-28 NOTE — ED Triage Notes (Signed)
Pt via POV from home. Pt states she had a sudden onset on L sided neck pain that shot up the L side of her head approx 10 mins PTA, states then she passed out and then she had a seizure. States she takes Lamictal but has not missed any doses. States the pain is now a "tingle" pt is A&Ox4 and NAD

## 2022-03-24 ENCOUNTER — Other Ambulatory Visit: Payer: Self-pay | Admitting: Neurology

## 2022-03-24 DIAGNOSIS — M542 Cervicalgia: Secondary | ICD-10-CM

## 2022-04-07 ENCOUNTER — Other Ambulatory Visit: Payer: 59

## 2022-04-08 ENCOUNTER — Other Ambulatory Visit: Payer: 59

## 2022-04-26 IMAGING — US US EXTREM LOW VENOUS*R*
1 series · 14 of 24 positions shown · non-contrast
Comparison: None.

CLINICAL DATA: Right lower extremity paresthesias, edema

EXAM:
RIGHT LOWER EXTREMITY VENOUS DOPPLER ULTRASOUND
TECHNIQUE: Gray-scale sonography with compression, as well as color and duplex
ultrasound, were performed to evaluate the deep venous system(s)
from the level of the common femoral vein through the popliteal and
proximal calf veins.

[Series 1: us venous img lower uni right (dvt) · portal-venous · 14 of 34 slices shown]
[im 1/34]
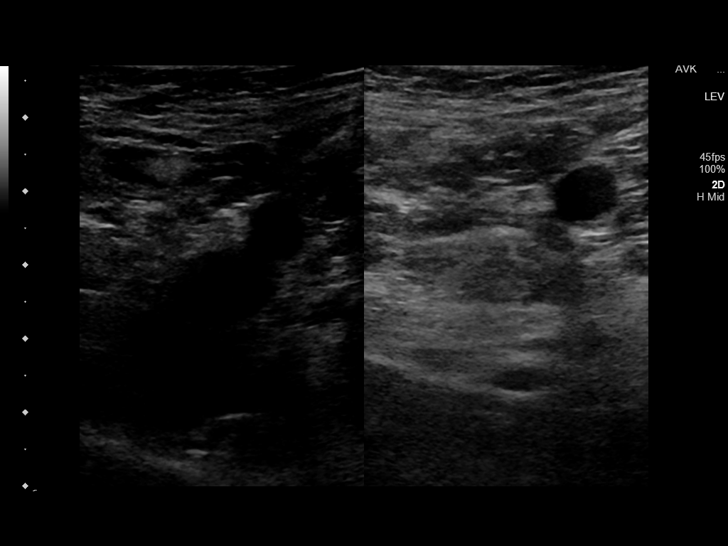
[im 3/34]
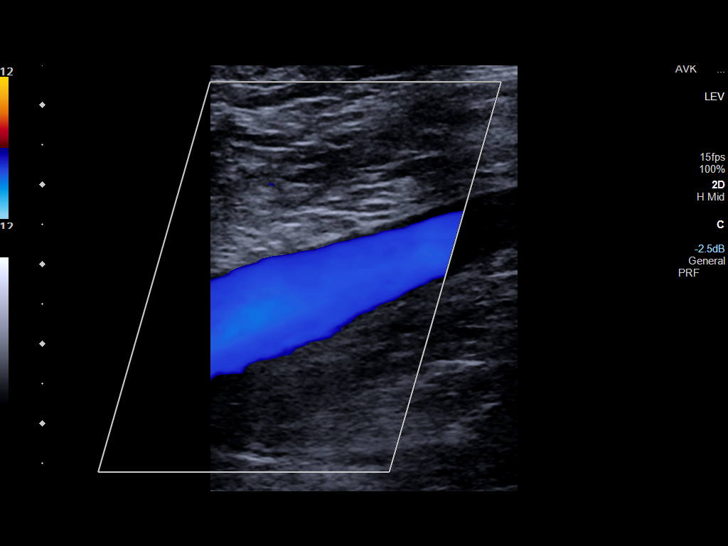
[im 6/34]
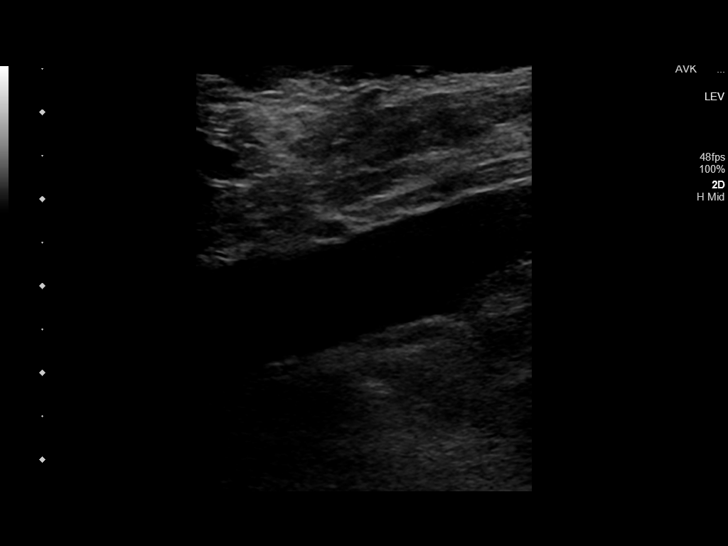
[im 9/34]
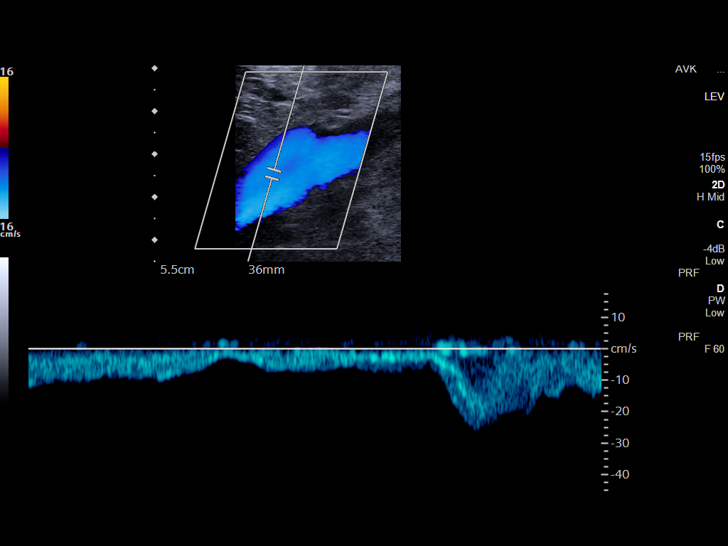
[im 11/34]
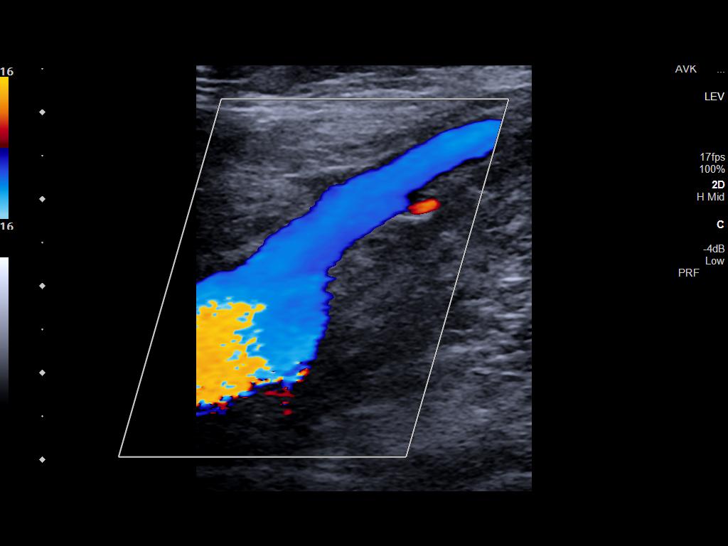
[im 13/34]
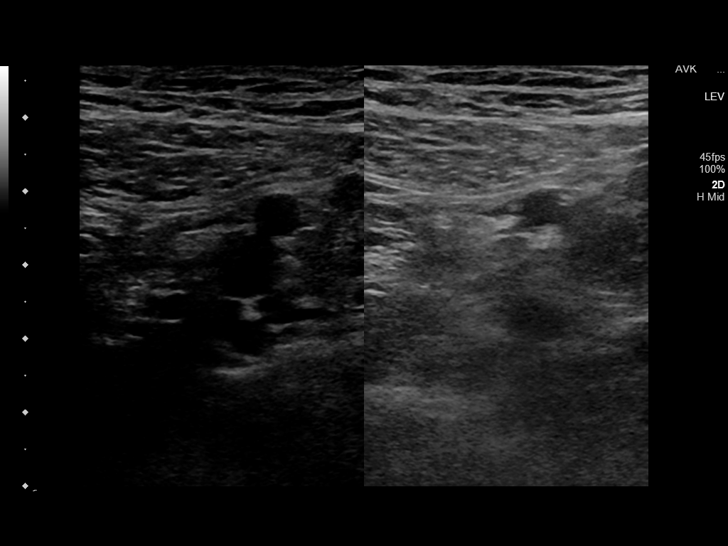
[im 16/34]
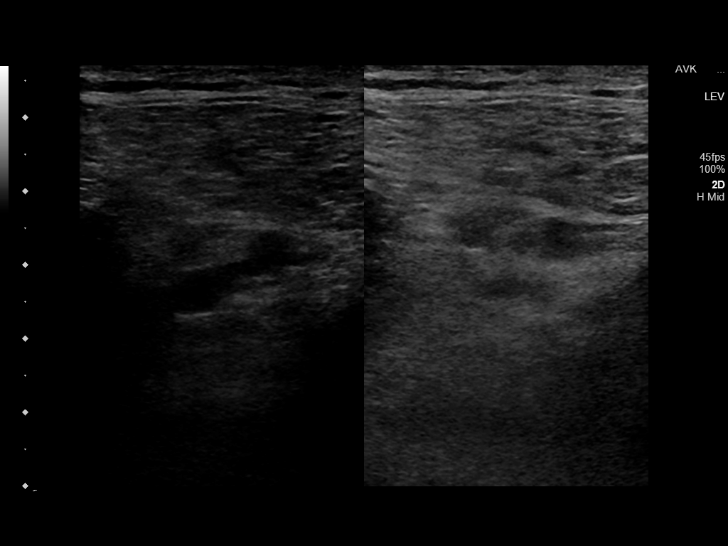
[im 18/34]
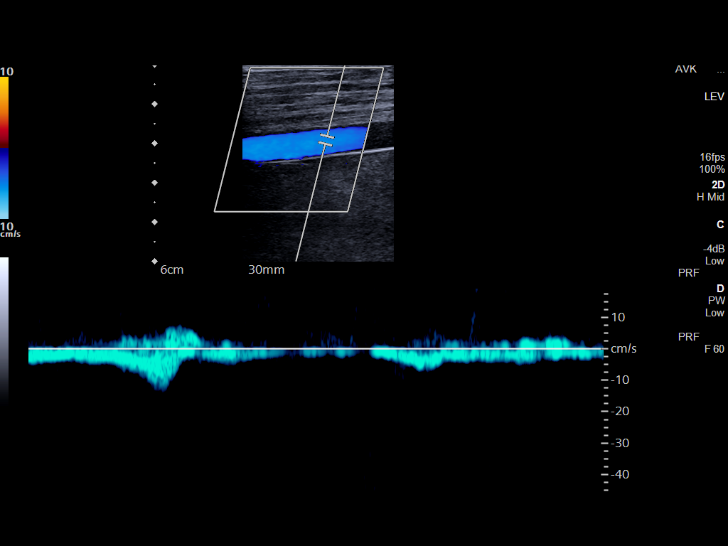
[im 21/34]
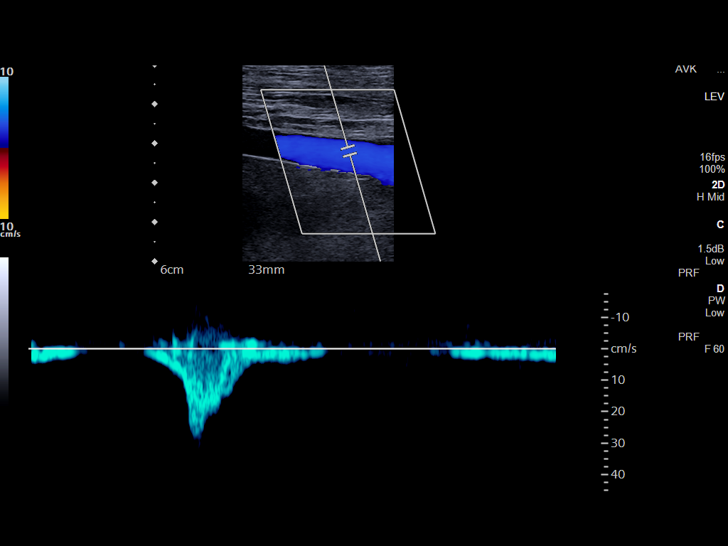
[im 23/34]
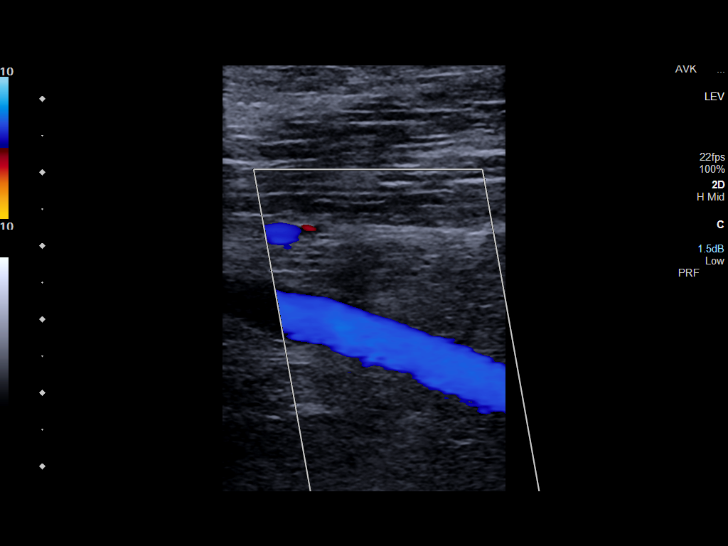
[im 26/34]
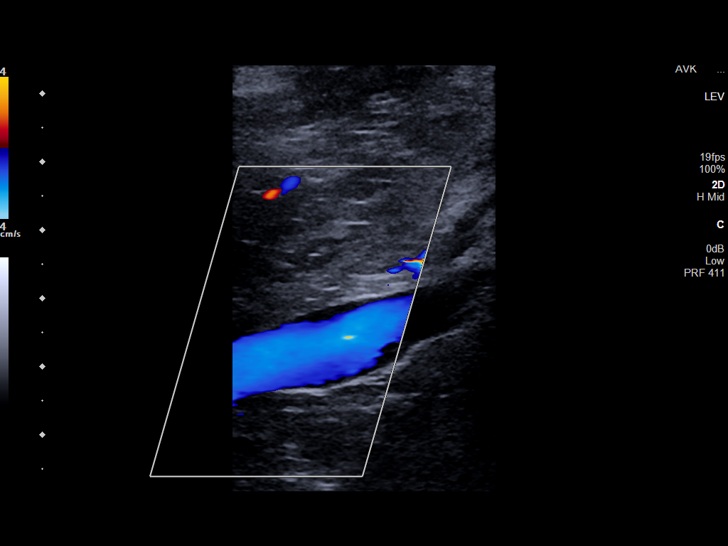
[im 28/34]
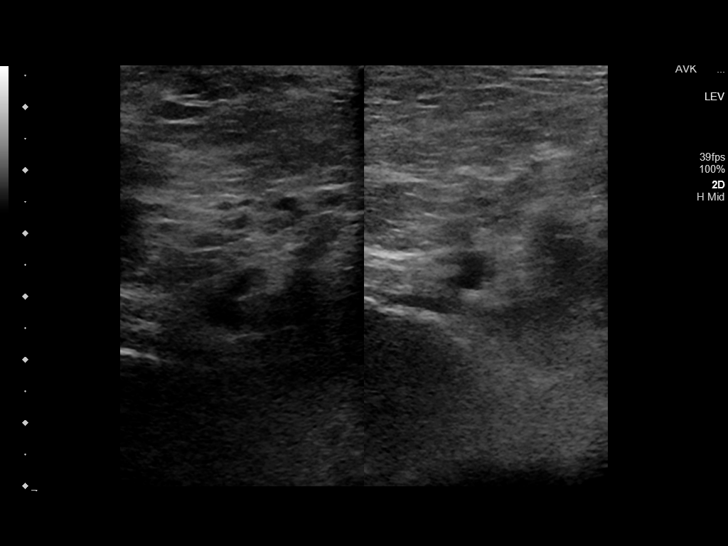
[im 31/34]
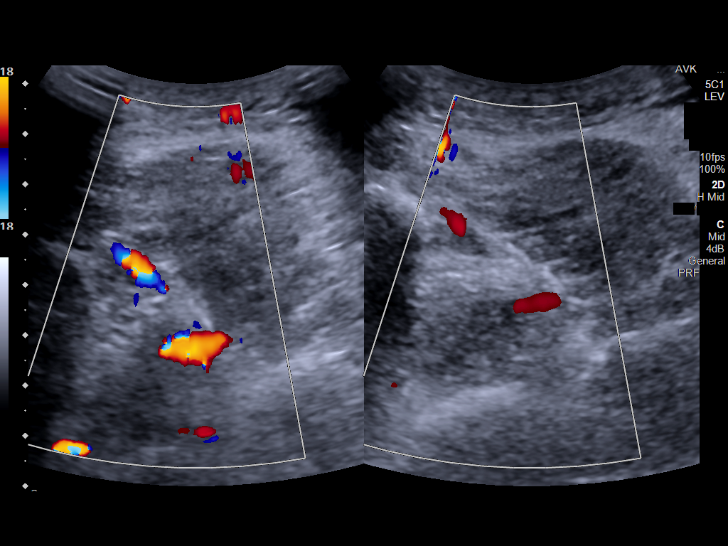
[im 34/34]
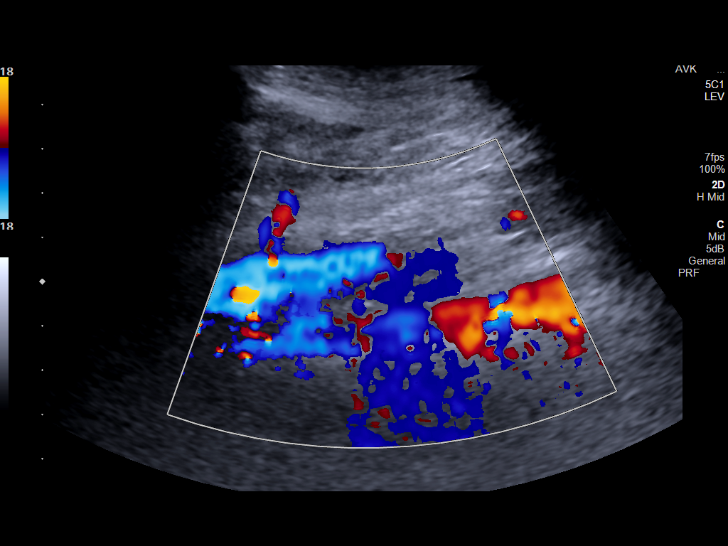

[14 of 24 positions shown; findings below may reference images not displayed]

FINDINGS: VENOUS

Normal compressibility of the common femoral, superficial femoral,
and popliteal veins, as well as the visualized calf veins.
Visualized portions of profunda femoral vein and great saphenous
vein unremarkable. No filling defects to suggest DVT on grayscale or
color Doppler imaging. Doppler waveforms show normal direction of
venous flow, normal respiratory plasticity and response to
augmentation.

Limited views of the contralateral common femoral vein are
unremarkable.

OTHER

None.

Limitations: none
IMPRESSION: Negative.

## 2022-05-08 IMAGING — CT CT ABD-PELV W/ CM
2 of 4 series · 15 of 46 positions shown, 17 images · IV contrast (APPLIED)
Comparison: CT, 05/07/2021.

CLINICAL DATA: Recent hospitalization for pancreatitis and
gallstones. Pain has now worsened in the upper abdomen and
epigastric region. Vomiting x2 yesterday. Constant nausea.

EXAM:
CT ABDOMEN AND PELVIS WITH CONTRAST
TECHNIQUE: Multidetector CT imaging of the abdomen and pelvis was performed
using the standard protocol following bolus administration of
intravenous contrast.
CONTRAST:  100mL OMNIPAQUE IOHEXOL 300 MG/ML  SOLN

[Series 2: routine abd/pel with · axial · 0.93mm/px · z∈[-531,-46]mm · 12 of 107 slices shown, 14 images]
[im 5/107  soft-tissue]
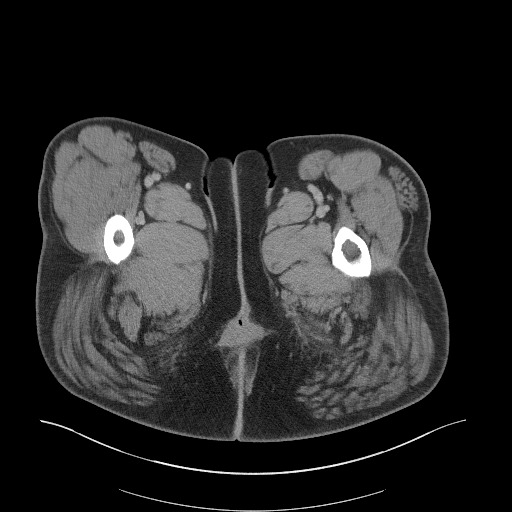
[im 5/107  bone]
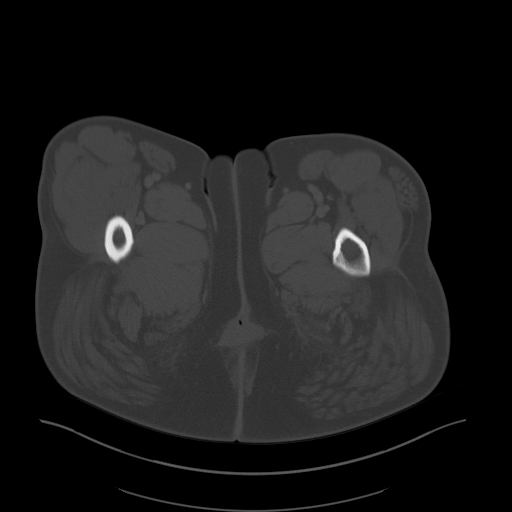
[im 14/107  soft-tissue]
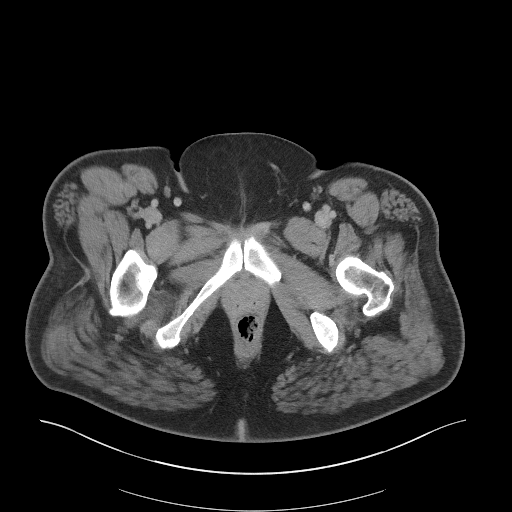
[im 24/107  soft-tissue]
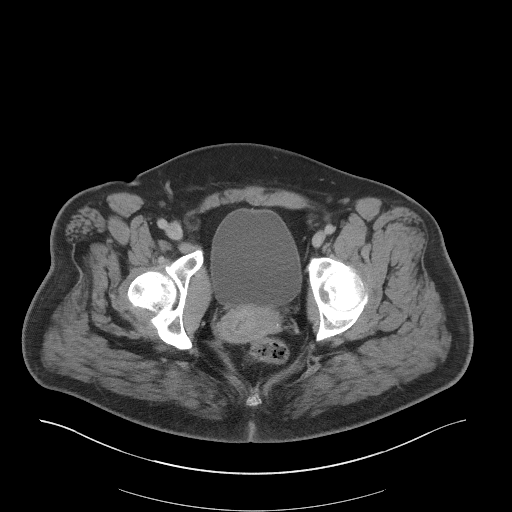
[im 33/107  soft-tissue]
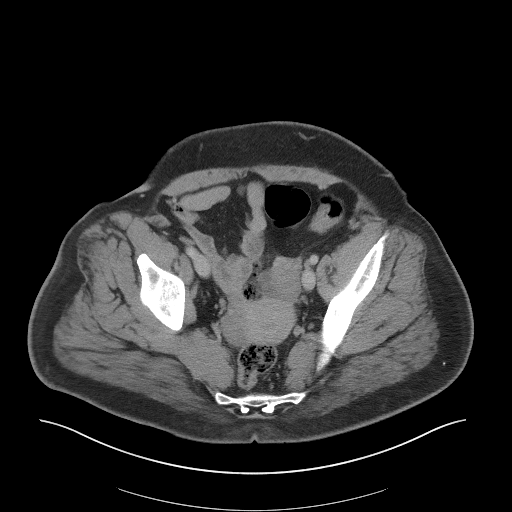
[im 42/107  soft-tissue]
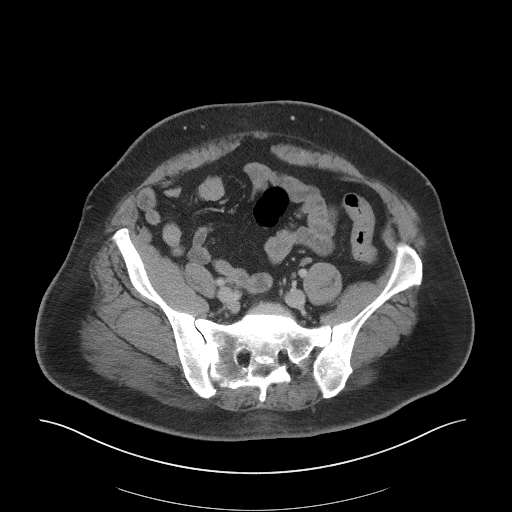
[im 51/107  soft-tissue]
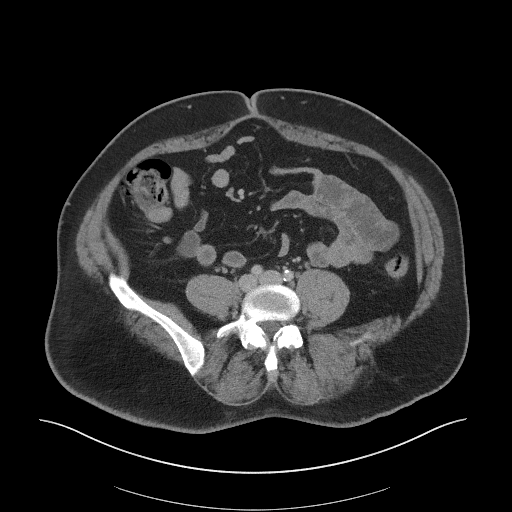
[im 56/107  soft-tissue]
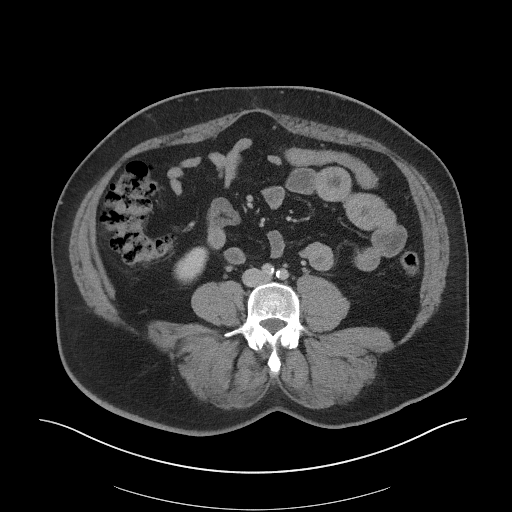
[im 65/107  soft-tissue]
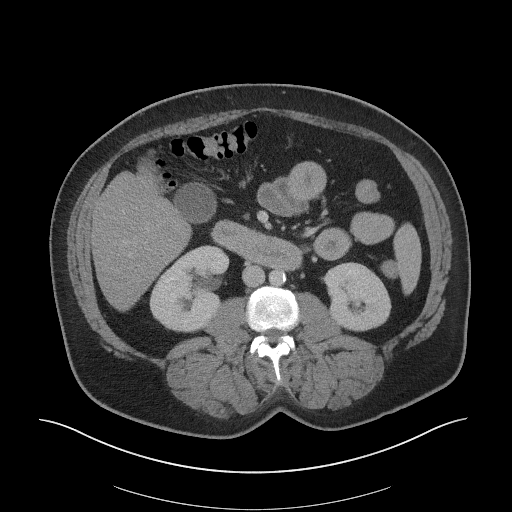
[im 74/107  soft-tissue]
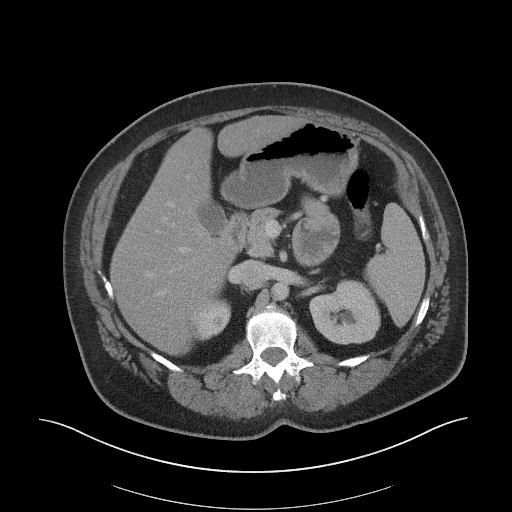
[im 74/107  bone]
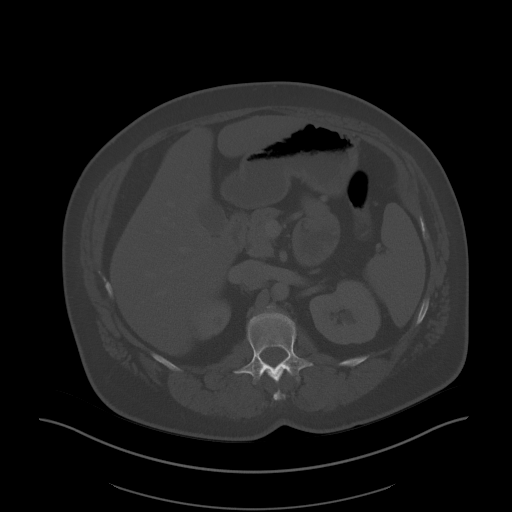
[im 83/107  soft-tissue]
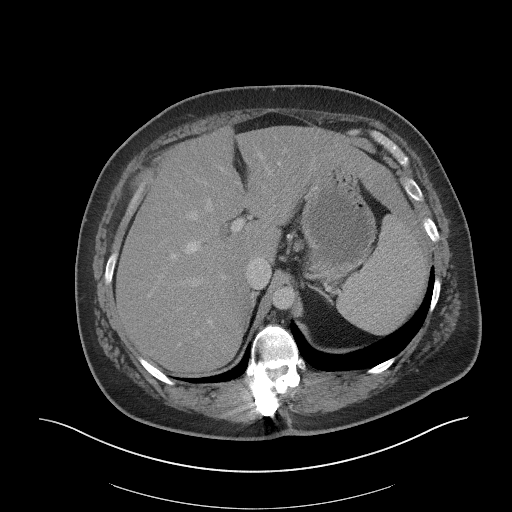
[im 93/107  soft-tissue]
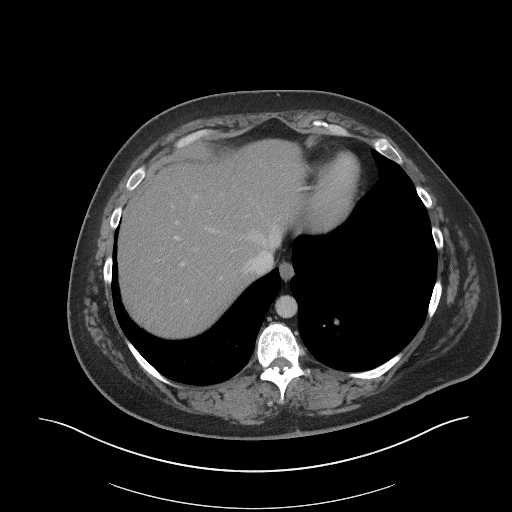
[im 102/107  soft-tissue]
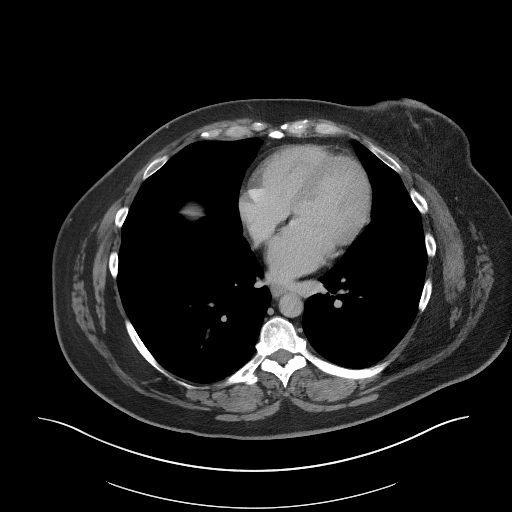

[Series 5: coronal st · coronal · 0.89mm/px · 3 of 114 slices shown]
[im 38/114  soft-tissue]
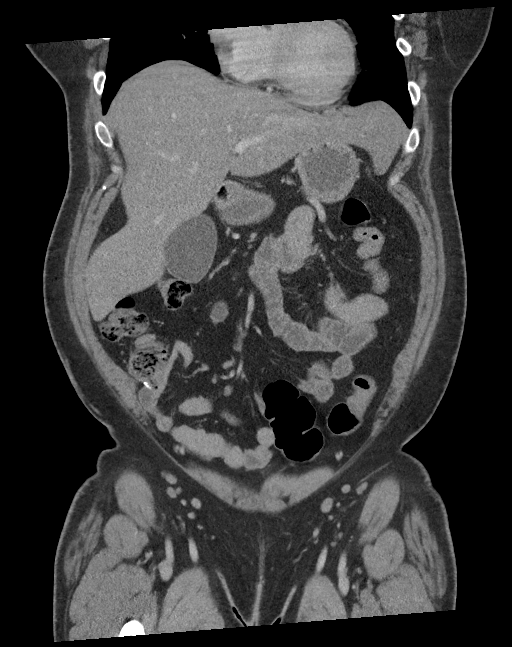
[im 51/114  soft-tissue]
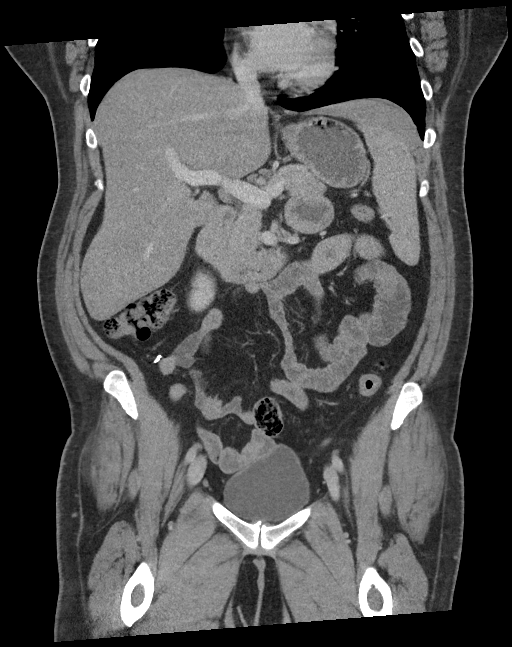
[im 63/114  soft-tissue]
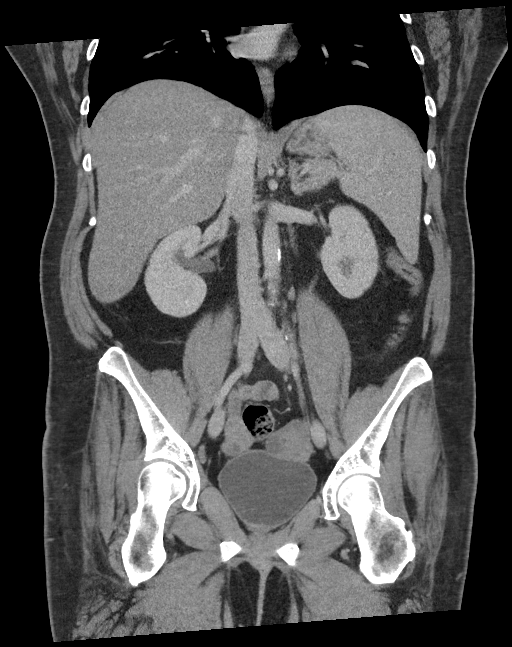

[15 of 46 positions shown; findings below may reference images not displayed]

FINDINGS: Lower chest: Clear lung bases.  Heart normal in size.

Hepatobiliary: Liver mildly enlarged. Diffuse decreased liver
attenuation consistent with fatty infiltration. No liver mass or
focal lesion. Normal gallbladder. Stones evident on the ultrasound
from 05/07/2021 are not resolved on CT. No gallbladder wall
thickening or pericholecystic inflammation. No bile duct dilation.

Pancreas: Normal appearance of the pancreas. Specifically, no mass
and no current inflammatory changes. Previously seen inflammation
along the pancreatic tail has resolved.

Spleen: Normal in size without focal abnormality.

Adrenals/Urinary Tract: No adrenal masses.

Kidneys normal in size, orientation and position. Subcentimeter
low-density lesion from the upper pole the right kidney, consistent
with a cyst. No other renal masses. No stones. Mild prominence of
the right intrarenal collecting system. Normal left intrarenal
collecting system. Ureters are normal in course and in caliber. No
ureteral stones. Normal bladder.

Stomach/Bowel: Normal stomach. Small bowel and colon are normal in
caliber. No wall thickening. No inflammation.

Vascular/Lymphatic: Aortic atherosclerosis. No aneurysm. Portal
vein, splenic vein and superior mesenteric vein are widely patent.
No enlarged lymph nodes.

Reproductive: Uterus and bilateral adnexa are unremarkable.

Other: No ascites. No mesenteric inflammation. No evidence of an
abscess or pseudocyst.

Musculoskeletal: Chronic bilateral pars defects at L5-S1 with a
minor anterolisthesis. Previous posterior fusion at T11-T12. No
fracture or acute finding. No bone lesion.
IMPRESSION: 1. No acute findings within the abdomen or pelvis.
2. Specifically, no current CT evidence of pancreatitis. Previously
seen pancreatic inflammation appears resolved.
3. Mild hepatomegaly and hepatic steatosis.
4. Aortic atherosclerosis.

## 2022-07-10 ENCOUNTER — Ambulatory Visit
Admission: RE | Admit: 2022-07-10 | Discharge: 2022-07-10 | Disposition: A | Discharge status: Home / Self Care | Payer: Self-pay | Source: Ambulatory Visit | Attending: Neurosurgery | Admitting: Neurosurgery

## 2022-07-10 ENCOUNTER — Other Ambulatory Visit: Payer: Self-pay

## 2022-07-10 DIAGNOSIS — Z049 Encounter for examination and observation for unspecified reason: Secondary | ICD-10-CM

## 2022-07-22 NOTE — Progress Notes (Signed)
Referring Physician:  Kaleen Mask, NP Breanna Mathews,  [place] [id number]  Primary Physician:  Breanna Picket, MD  History of Present Illness: 07/23/2022 [name] is here today with a chief complaint of head pain that radiates into the neck pain that radiates into the bilateral shoulders but not into the arms. She also complains of gait issues and trouble with grip strength in the bilateral hands. She has numbness in the hands as well.   She has been having neck pain for over a year but her neck pain is worsened for the past several months.  She reports pain with movement of her neck.  Coughing, sneezing, and rotation of her neck make her pain worse.  She is also having numbness in her hands.  She has some clumsiness with her hands.  She feels off balance.   She was previously seen in the neurosurgery clinic at [facility] as well as the preoperative pain clinic there.  She was recommended to have surgery, but had issues with insurance authorizations.  She was referred here for second opinion.  Bowel/Bladder Dysfunction: none  Conservative measures:  Physical therapy: has not participated in Multimodal medical therapy including regular antiinflammatories: duloxetine, soma, tylenol, gabapentin, lorazepam Injections: has not received any epidural steroid injections  Past Surgery:  Back Surgery about 13-14 years ago  Breanna Mathews has symptoms of cervical myelopathy  The symptoms are causing a significant impact on the patient's life.   I have utilized the care everywhere function in epic to review the outside records available from external health systems.  Review of Systems:  A 10 point review of systems is negative, except for the pertinent positives and negatives detailed in the HPI.  Past Medical History: Past Medical History:  Diagnosis Date   Angina of effort    Anxiety    Bipolar disorder with depression ([place])    Chronic back pain     GERD (gastroesophageal reflux disease)    Hypertension    Mitral valve prolapse     Past Surgical History: Past Surgical History:  Procedure Laterality Date   APPENDECTOMY     BACK SURGERY     CESAREAN SECTION     KNEE ARTHROPLASTY Left    x 2   POLYPECTOMY     TONSILLECTOMY      Allergies: Allergies as of 07/23/2022 - Review Complete 07/23/2022  Allergen Reaction Noted   Ondansetron Other (See Comments) 02/20/2017   Buspirone Other (See Comments) 07/23/2022   Gabapentin Other (See Comments) 06/06/2021   Keflex [cephalexin] Rash 01/16/2015   Lithium Palpitations 01/24/2016   Opana [oxymorphone hcl] Palpitations 07/14/2016   Oxymorphone Other (See Comments) 01/16/2015    Medications: Current Meds  Medication Sig   albuterol (VENTOLIN HFA) 108 (90 Base) MCG/ACT inhaler Inhale into the lungs.   DULoxetine (CYMBALTA) 60 MG capsule Take 60 mg by mouth daily.   hydrOXYzine (ATARAX) 25 MG tablet Take 25 mg by mouth in the morning and at bedtime.   LEVEMIR FLEXPEN 100 UNIT/ML FlexPen Inject into the skin.   SUMAtriptan (IMITREX) 100 MG tablet Take 100 mg by mouth once.   topiramate (TOPAMAX) 25 MG tablet Take 25 mg by mouth daily.    Social History: Social History   Tobacco Use   Smoking status: Every Day    Packs/day: 0.50    Types: Cigarettes   Smokeless tobacco: Never   Tobacco comments:    2 cigarettes/day  Vaping Use  Vaping Use: Never used  Substance Use Topics   Alcohol use: No   Drug use: No    Family Medical History: No family history on file.  Physical Examination: Vitals:   07/23/22 0917  BP: 124/78    General: Patient is well developed, well nourished, calm, collected, and in no apparent distress. Attention to examination is appropriate.  Neck:   Supple.  Limited rotation to the right and left with discomfort on rotation and extension.   Respiratory: Patient is breathing without any difficulty.   NEUROLOGICAL:     Awake, alert,  oriented to person, place, and time.  Speech is clear and fluent.   Cranial Nerves: Pupils equal round and reactive to light.  Facial tone is symmetric.  Facial sensation is symmetric. Shoulder shrug is symmetric. Tongue protrusion is midline.  There is no pronator drift.  ROM of spine: full.    Strength: Side Biceps Triceps Deltoid Interossei Grip Wrist Ext. Wrist Flex.  R 5 5 5 5  4+ 5 5  L 5 5 5 5  4+ 5 5   Side Iliopsoas Quads Hamstring PF DF EHL  R 5 5 5 5 5 5   L 5 5 5 5 5 5    Reflexes are 1+ and symmetric at the biceps, triceps, brachioradialis, patella and achilles.   Hoffman's is present on left.   Bilateral upper and lower extremity sensation is intact to light touch with exception of hands and forearms which are diminished.    No evidence of dysmetria noted.  Gait is wide-based.  She has moderate to severe difficulty with tandem walk..     Medical Decision Making  Imaging: MRI C spine 05/26/22 IMPRESSION:  1. Calcification the posterior longitudinal ligament with multiple  superimposed disc bulges from C3-4 through C6-7. There is severe spinal  canal stenosis at C4-5 and C5-6 without evidence of cord edema. Overall  findings appear similar to outside MRI 03/27/2022.  2.  No evidence of acute ligamentous injury.    Electronically Signed by:  Breanna Dock, MD, [facility]  Electronically Signed on:  05/26/2022 11:31 AM   I have personally reviewed the images and agree with the above interpretation with the exception that there does seem to be some increased T2 signal behind the C4-5 disc base consistent with possible myelomalacia..  Assessment and Plan: [name] is a pleasant 37 y.o. 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  She is also suffering from chronic pain syndrome and is on Suboxone for treatment of this.  She sees [facility] pain management.  She has clear symptomatology referable to her  spinal stenosis and myelopathy.  There is no role for conservative management for this condition.  I think that surgical intervention should be considered.  We discussed the options which include C3-7 anterior cervical discectomy and fusion versus C3-7 laminoplasty in my opinion.  1 could also consider posterior decompression and fusion.  I would be very hesitant to consider a posterior cervical decompression and fusion given that she is currently smoking.  While I feel that surgery is reasonable to pursue, we do not have an acute pain service at [facility].  I would be very concerned about her ability to control her pain in the postoperative setting given her need for Suboxone and Subutex.  Thus, I have recommended that she proceed with surgical intervention either at a facility where an acute pain service is available, or that she wean off of her  Suboxone and Subutex.  She did express to me that she does not think she can wean off of her Suboxone and Subutex.  I will communicate with her [facility] neurosurgeon Dr. Orson Slick.  I am recommending that she return to see him for additional recommendations and a second attempt at authorization of surgery for this problem, as there is no reasonable conservative management to proceed with at this point.  I spent a total of 45 minutes in this patient's care today. This time was spent reviewing pertinent records including imaging studies, obtaining and confirming history, performing a directed evaluation, formulating and discussing my recommendations, and documenting the visit within the medical record.     Thank you for involving me in the care of this patient.      Izaia Say K. Myer Haff MD, [facility] Neurosurgery

## 2022-07-23 ENCOUNTER — Encounter: Payer: Self-pay | Admitting: Neurosurgery

## 2022-07-23 ENCOUNTER — Ambulatory Visit (INDEPENDENT_AMBULATORY_CARE_PROVIDER_SITE_OTHER): Payer: 59 | Admitting: Neurosurgery

## 2022-07-23 VITALS — BP 124/78 | Ht 69.0 in | Wt 224.8 lb

## 2022-07-23 DIAGNOSIS — M5412 Radiculopathy, cervical region: Secondary | ICD-10-CM

## 2022-07-23 DIAGNOSIS — M4802 Spinal stenosis, cervical region: Secondary | ICD-10-CM

## 2022-07-23 DIAGNOSIS — G959 Disease of spinal cord, unspecified: Secondary | ICD-10-CM | POA: Diagnosis not present

## 2024-01-07 ENCOUNTER — Inpatient Hospital Stay
Admission: EM | Admit: 2024-01-07 | Discharge: 2024-01-10 | DRG: 308 | Disposition: A | Discharge status: Home / Self Care | Attending: Internal Medicine | Admitting: Internal Medicine

## 2024-01-07 ENCOUNTER — Emergency Department

## 2024-01-07 ENCOUNTER — Inpatient Hospital Stay (HOSPITAL_COMMUNITY)
Admit: 2024-01-07 | Discharge: 2024-01-07 | Disposition: A | Discharge status: Home / Self Care | Attending: Pulmonary Disease | Admitting: Pulmonary Disease

## 2024-01-07 ENCOUNTER — Other Ambulatory Visit: Payer: Self-pay

## 2024-01-07 DIAGNOSIS — I493 Ventricular premature depolarization: Secondary | ICD-10-CM | POA: Diagnosis present

## 2024-01-07 DIAGNOSIS — I10 Essential (primary) hypertension: Secondary | ICD-10-CM | POA: Diagnosis present

## 2024-01-07 DIAGNOSIS — F17210 Nicotine dependence, cigarettes, uncomplicated: Secondary | ICD-10-CM | POA: Diagnosis present

## 2024-01-07 DIAGNOSIS — I472 Ventricular tachycardia, unspecified: Principal | ICD-10-CM | POA: Diagnosis present

## 2024-01-07 DIAGNOSIS — I341 Nonrheumatic mitral (valve) prolapse: Secondary | ICD-10-CM | POA: Diagnosis present

## 2024-01-07 DIAGNOSIS — Z96652 Presence of left artificial knee joint: Secondary | ICD-10-CM | POA: Diagnosis present

## 2024-01-07 DIAGNOSIS — Z79899 Other long term (current) drug therapy: Secondary | ICD-10-CM

## 2024-01-07 DIAGNOSIS — G8929 Other chronic pain: Secondary | ICD-10-CM | POA: Diagnosis present

## 2024-01-07 DIAGNOSIS — Z981 Arthrodesis status: Secondary | ICD-10-CM | POA: Diagnosis not present

## 2024-01-07 DIAGNOSIS — L03115 Cellulitis of right lower limb: Secondary | ICD-10-CM | POA: Diagnosis present

## 2024-01-07 DIAGNOSIS — I4729 Other ventricular tachycardia: Secondary | ICD-10-CM

## 2024-01-07 DIAGNOSIS — E119 Type 2 diabetes mellitus without complications: Secondary | ICD-10-CM | POA: Diagnosis present

## 2024-01-07 DIAGNOSIS — Z881 Allergy status to other antibiotic agents status: Secondary | ICD-10-CM

## 2024-01-07 DIAGNOSIS — M329 Systemic lupus erythematosus, unspecified: Secondary | ICD-10-CM | POA: Diagnosis present

## 2024-01-07 DIAGNOSIS — Z885 Allergy status to narcotic agent status: Secondary | ICD-10-CM

## 2024-01-07 DIAGNOSIS — R570 Cardiogenic shock: Secondary | ICD-10-CM | POA: Diagnosis present

## 2024-01-07 DIAGNOSIS — R079 Chest pain, unspecified: Secondary | ICD-10-CM | POA: Diagnosis not present

## 2024-01-07 DIAGNOSIS — M549 Dorsalgia, unspecified: Secondary | ICD-10-CM | POA: Diagnosis present

## 2024-01-07 DIAGNOSIS — G40909 Epilepsy, unspecified, not intractable, without status epilepticus: Secondary | ICD-10-CM | POA: Diagnosis present

## 2024-01-07 DIAGNOSIS — G43909 Migraine, unspecified, not intractable, without status migrainosus: Secondary | ICD-10-CM | POA: Diagnosis present

## 2024-01-07 DIAGNOSIS — F319 Bipolar disorder, unspecified: Secondary | ICD-10-CM | POA: Diagnosis present

## 2024-01-07 DIAGNOSIS — Z860100 Personal history of colon polyps, unspecified: Secondary | ICD-10-CM

## 2024-01-07 DIAGNOSIS — Z7984 Long term (current) use of oral hypoglycemic drugs: Secondary | ICD-10-CM

## 2024-01-07 DIAGNOSIS — K219 Gastro-esophageal reflux disease without esophagitis: Secondary | ICD-10-CM | POA: Diagnosis present

## 2024-01-07 DIAGNOSIS — F419 Anxiety disorder, unspecified: Secondary | ICD-10-CM | POA: Diagnosis present

## 2024-01-07 DIAGNOSIS — Z794 Long term (current) use of insulin: Secondary | ICD-10-CM | POA: Diagnosis not present

## 2024-01-07 DIAGNOSIS — I959 Hypotension, unspecified: Secondary | ICD-10-CM | POA: Diagnosis not present

## 2024-01-07 DIAGNOSIS — F840 Autistic disorder: Secondary | ICD-10-CM | POA: Diagnosis present

## 2024-01-07 DIAGNOSIS — Z888 Allergy status to other drugs, medicaments and biological substances status: Secondary | ICD-10-CM

## 2024-01-07 DIAGNOSIS — E669 Obesity, unspecified: Secondary | ICD-10-CM | POA: Diagnosis present

## 2024-01-07 DIAGNOSIS — M4802 Spinal stenosis, cervical region: Secondary | ICD-10-CM | POA: Diagnosis present

## 2024-01-07 LAB — BASIC METABOLIC PANEL WITH GFR
Anion gap: 10 (ref 5–15)
BUN: 16 mg/dL (ref 6–20)
CO2: 25 mmol/L (ref 22–32)
Calcium: 8.5 mg/dL — ABNORMAL LOW (ref 8.9–10.3)
Chloride: 102 mmol/L (ref 98–111)
Creatinine, Ser: 0.67 mg/dL (ref 0.44–1.00)
GFR, Estimated: >60 mL/min (ref >60)
Glucose, Bld: 108 mg/dL — ABNORMAL HIGH (ref 70–99)
Potassium: 3.4 mmol/L — ABNORMAL LOW (ref 3.5–5.1)
Sodium: 137 mmol/L (ref 135–145)

## 2024-01-07 LAB — COMPREHENSIVE METABOLIC PANEL WITH GFR
ALT: 25 U/L (ref 0–44)
AST: 22 U/L (ref 15–41)
Albumin: 3.8 g/dL (ref 3.5–5.0)
Alkaline Phosphatase: 101 U/L (ref 38–126)
Anion gap: 11 (ref 5–15)
BUN: 18 mg/dL (ref 6–20)
CO2: 27 mmol/L (ref 22–32)
Calcium: 9.4 mg/dL (ref 8.9–10.3)
Chloride: 98 mmol/L (ref 98–111)
Creatinine, Ser: 0.88 mg/dL (ref 0.44–1.00)
GFR, Estimated: >60 mL/min (ref >60)
Glucose, Bld: 140 mg/dL — ABNORMAL HIGH (ref 70–99)
Potassium: 3.7 mmol/L (ref 3.5–5.1)
Sodium: 136 mmol/L (ref 135–145)
Total Bilirubin: 0.7 mg/dL (ref 0.0–1.2)
Total Protein: 7.5 g/dL (ref 6.5–8.1)

## 2024-01-07 LAB — ECHOCARDIOGRAM COMPLETE
AR max vel: 2.43 cm2
AV Peak grad: 6.5 mmHg
Ao pk vel: 1.27 m/s
Area-P 1/2: 3.19 cm2
Height: 68 in
S' Lateral: 3.3 cm
Weight: 3360 [oz_av]

## 2024-01-07 LAB — PROCALCITONIN: Procalcitonin: <0.1 ng/mL

## 2024-01-07 LAB — CBC WITH DIFFERENTIAL/PLATELET
Abs Immature Granulocytes: 0.02 K/uL (ref 0.00–0.07)
Basophils Absolute: 0.1 K/uL (ref 0.0–0.1)
Basophils Relative: 1 %
Eosinophils Absolute: 0.3 K/uL (ref 0.0–0.5)
Eosinophils Relative: 3 %
HCT: 39.9 % (ref 36.0–46.0)
Hemoglobin: 13.3 g/dL (ref 12.0–15.0)
Immature Granulocytes: 0 %
Lymphocytes Relative: 41 %
Lymphs Abs: 4.5 K/uL — ABNORMAL HIGH (ref 0.7–4.0)
MCH: 30 pg (ref 26.0–34.0)
MCHC: 33.3 g/dL (ref 30.0–36.0)
MCV: 90.1 fL (ref 80.0–100.0)
Monocytes Absolute: 0.7 K/uL (ref 0.1–1.0)
Monocytes Relative: 6 %
Neutro Abs: 5.5 K/uL (ref 1.7–7.7)
Neutrophils Relative %: 49 %
Platelets: 403 K/uL — ABNORMAL HIGH (ref 150–400)
RBC: 4.43 MIL/uL (ref 3.87–5.11)
RDW: 12.9 % (ref 11.5–15.5)
WBC: 11 K/uL — ABNORMAL HIGH (ref 4.0–10.5)
nRBC: 0 % (ref 0.0–0.2)

## 2024-01-07 LAB — MAGNESIUM
Magnesium: 2.1 mg/dL (ref 1.7–2.4)
Magnesium: 2 mg/dL (ref 1.7–2.4)

## 2024-01-07 LAB — HIV ANTIBODY (ROUTINE TESTING W REFLEX): HIV Screen 4th Generation wRfx: NONREACTIVE

## 2024-01-07 LAB — LIPID PANEL
Cholesterol: 166 mg/dL (ref 0–200)
HDL: 24 mg/dL — ABNORMAL LOW (ref >40)
LDL Cholesterol: 88 mg/dL (ref 0–99)
Total CHOL/HDL Ratio: 6.9 ratio
Triglycerides: 272 mg/dL — ABNORMAL HIGH (ref <150)
VLDL: 54 mg/dL — ABNORMAL HIGH (ref 0–40)

## 2024-01-07 LAB — CBC
HCT: 33.8 % — ABNORMAL LOW (ref 36.0–46.0)
Hemoglobin: 11.4 g/dL — ABNORMAL LOW (ref 12.0–15.0)
MCH: 30 pg (ref 26.0–34.0)
MCHC: 33.7 g/dL (ref 30.0–36.0)
MCV: 88.9 fL (ref 80.0–100.0)
Platelets: 310 K/uL (ref 150–400)
RBC: 3.8 MIL/uL — ABNORMAL LOW (ref 3.87–5.11)
RDW: 12.7 % (ref 11.5–15.5)
WBC: 9.6 K/uL (ref 4.0–10.5)
nRBC: 0 % (ref 0.0–0.2)

## 2024-01-07 LAB — GLUCOSE, CAPILLARY
Glucose-Capillary: 113 mg/dL — ABNORMAL HIGH (ref 70–99)
Glucose-Capillary: 115 mg/dL — ABNORMAL HIGH (ref 70–99)
Glucose-Capillary: 106 mg/dL — ABNORMAL HIGH (ref 70–99)
Glucose-Capillary: 93 mg/dL (ref 70–99)
Glucose-Capillary: 91 mg/dL (ref 70–99)
Glucose-Capillary: 149 mg/dL — ABNORMAL HIGH (ref 70–99)
Glucose-Capillary: 109 mg/dL — ABNORMAL HIGH (ref 70–99)

## 2024-01-07 LAB — PHOSPHORUS: Phosphorus: 3.3 mg/dL (ref 2.5–4.6)

## 2024-01-07 LAB — APTT: aPTT: 34 s (ref 24–36)

## 2024-01-07 LAB — TROPONIN I (HIGH SENSITIVITY)
Troponin I (High Sensitivity): 3 ng/L (ref <18)
Troponin I (High Sensitivity): 4 ng/L (ref <18)

## 2024-01-07 LAB — LACTIC ACID, PLASMA: Lactic Acid, Venous: 1.8 mmol/L (ref 0.5–1.9)

## 2024-01-07 LAB — PROTIME-INR
INR: 0.9 (ref 0.8–1.2)
Prothrombin Time: 12.9 s (ref 11.4–15.2)

## 2024-01-07 LAB — BRAIN NATRIURETIC PEPTIDE: B Natriuretic Peptide: 8 pg/mL (ref 0.0–100.0)

## 2024-01-07 LAB — MRSA NEXT GEN BY PCR, NASAL: MRSA by PCR Next Gen: NOT DETECTED

## 2024-01-07 LAB — HEMOGLOBIN A1C
Hgb A1c MFr Bld: 6.4 % — ABNORMAL HIGH (ref 4.8–5.6)
Mean Plasma Glucose: 136.98 mg/dL

## 2024-01-07 MED ORDER — AMIODARONE HCL IN DEXTROSE 360-4.14 MG/200ML-% IV SOLN
30.0000 mg/h | INTRAVENOUS | Status: DC
  Administered 2024-01-07: 30 mg/h via INTRAVENOUS

## 2024-01-07 MED ORDER — LORAZEPAM 1 MG PO TABS: 1.0000 mg | ORAL_TABLET | Freq: Four times a day (QID) | ORAL | Status: DC | PRN

## 2024-01-07 MED ORDER — DULOXETINE HCL 30 MG PO CPEP
60.0000 mg | ORAL_CAPSULE | Freq: Every day | ORAL | Status: DC
  Administered 2024-01-07 – 2024-01-10 (×4): 60 mg via ORAL
  Filled 2024-01-07 (×4): qty 2

## 2024-01-07 MED ORDER — SODIUM CHLORIDE 0.9 % IV BOLUS
1000.0000 mL | Freq: Once | INTRAVENOUS | Status: AC
  Administered 2024-01-07: 1000 mL via INTRAVENOUS

## 2024-01-07 MED ORDER — POTASSIUM CHLORIDE CRYS ER 20 MEQ PO TBCR
40.0000 meq | EXTENDED_RELEASE_TABLET | Freq: Once | ORAL | Status: AC
  Administered 2024-01-07: 40 meq via ORAL

## 2024-01-07 MED ORDER — PHENYLEPHRINE HCL-NACL 20-0.9 MG/250ML-% IV SOLN
25.0000 ug/min | INTRAVENOUS | Status: DC
  Administered 2024-01-07 (×2): 25 ug/min via INTRAVENOUS
  Filled 2024-01-07 (×2): qty 250

## 2024-01-07 MED ORDER — POTASSIUM CHLORIDE CRYS ER 20 MEQ PO TBCR
20.0000 meq | EXTENDED_RELEASE_TABLET | Freq: Once | ORAL | Status: AC
  Administered 2024-01-07: 20 meq via ORAL
  Filled 2024-01-07: qty 1

## 2024-01-07 MED ORDER — LIDOCAINE HCL (CARDIAC) PF 100 MG/5ML IV SOSY: 100.0000 mg | PREFILLED_SYRINGE | Freq: Once | INTRAVENOUS | Status: DC

## 2024-01-07 MED ORDER — LORAZEPAM 1 MG PO TABS
1.0000 mg | ORAL_TABLET | Freq: Three times a day (TID) | ORAL | Status: DC | PRN
  Administered 2024-01-08 – 2024-01-10 (×7): 1 mg via ORAL
  Filled 2024-01-07 (×7): qty 1

## 2024-01-07 MED ORDER — LACTATED RINGERS IV BOLUS
500.0000 mL | Freq: Once | INTRAVENOUS | Status: AC
  Administered 2024-01-07: 500 mL via INTRAVENOUS

## 2024-01-07 MED ORDER — AMIODARONE HCL IN DEXTROSE 360-4.14 MG/200ML-% IV SOLN
60.0000 mg/h | INTRAVENOUS | Status: AC
  Administered 2024-01-07 (×2): 60 mg/h via INTRAVENOUS
  Filled 2024-01-07 (×2): qty 200

## 2024-01-07 MED ORDER — LIDOCAINE IN D5W 4-5 MG/ML-% IV SOLN
1.0000 mg/min | INTRAVENOUS | Status: AC
  Administered 2024-01-07 – 2024-01-08 (×2): 1 mg/min via INTRAVENOUS
  Filled 2024-01-07 (×2): qty 500

## 2024-01-07 MED ORDER — KETOROLAC TROMETHAMINE 30 MG/ML IJ SOLN
30.0000 mg | Freq: Four times a day (QID) | INTRAMUSCULAR | Status: DC
  Administered 2024-01-07 (×2): 30 mg via INTRAVENOUS
  Filled 2024-01-07 (×2): qty 1

## 2024-01-07 MED ORDER — DIPHENHYDRAMINE HCL 50 MG/ML IJ SOLN
25.0000 mg | Freq: Once | INTRAMUSCULAR | Status: AC
  Administered 2024-01-07: 25 mg via INTRAVENOUS
  Filled 2024-01-07: qty 1

## 2024-01-07 MED ORDER — METHOCARBAMOL 500 MG PO TABS
500.0000 mg | ORAL_TABLET | Freq: Four times a day (QID) | ORAL | Status: DC
  Administered 2024-01-07 – 2024-01-10 (×14): 500 mg via ORAL
  Filled 2024-01-07 (×14): qty 1

## 2024-01-07 MED ORDER — BUPRENORPHINE HCL 8 MG SL SUBL
8.0000 mg | SUBLINGUAL_TABLET | Freq: Two times a day (BID) | SUBLINGUAL | Status: DC
  Administered 2024-01-07 – 2024-01-08 (×3): 8 mg via SUBLINGUAL
  Filled 2024-01-07 (×3): qty 1

## 2024-01-07 MED ORDER — KETOROLAC TROMETHAMINE 30 MG/ML IJ SOLN
30.0000 mg | Freq: Three times a day (TID) | INTRAMUSCULAR | Status: DC | PRN
  Administered 2024-01-07 – 2024-01-08 (×2): 30 mg via INTRAVENOUS
  Filled 2024-01-07 (×2): qty 1

## 2024-01-07 MED ORDER — POLYETHYLENE GLYCOL 3350 17 G PO PACK: 17.0000 g | PACK | Freq: Every day | ORAL | Status: DC | PRN

## 2024-01-07 MED ORDER — DOCUSATE SODIUM 100 MG PO CAPS: 100.0000 mg | ORAL_CAPSULE | Freq: Two times a day (BID) | ORAL | Status: DC | PRN

## 2024-01-07 MED ORDER — BUPRENORPHINE HCL-NALOXONE HCL 8-2 MG SL SUBL: 1.0000 | SUBLINGUAL_TABLET | Freq: Two times a day (BID) | SUBLINGUAL | Status: DC

## 2024-01-07 MED ORDER — POTASSIUM CHLORIDE CRYS ER 20 MEQ PO TBCR
40.0000 meq | EXTENDED_RELEASE_TABLET | Freq: Once | ORAL | Status: DC
  Filled 2024-01-07: qty 2

## 2024-01-07 MED ORDER — INSULIN ASPART 100 UNIT/ML IJ SOLN
0.0000 [IU] | INTRAMUSCULAR | Status: DC
  Administered 2024-01-07 – 2024-01-08 (×2): 2 [IU] via SUBCUTANEOUS
  Filled 2024-01-07 (×2): qty 1

## 2024-01-07 MED ORDER — POTASSIUM CHLORIDE CRYS ER 20 MEQ PO TBCR
40.0000 meq | EXTENDED_RELEASE_TABLET | Freq: Once | ORAL | Status: AC
  Administered 2024-01-07: 40 meq via ORAL
  Filled 2024-01-07: qty 2

## 2024-01-07 MED ORDER — LORAZEPAM 1 MG PO TABS
0.5000 mg | ORAL_TABLET | Freq: Three times a day (TID) | ORAL | Status: DC | PRN
  Administered 2024-01-07: 0.5 mg via ORAL
  Filled 2024-01-07: qty 1

## 2024-01-07 MED ORDER — LACTATED RINGERS IV SOLN: INTRAVENOUS | Status: AC

## 2024-01-07 MED ORDER — BUPRENORPHINE HCL-NALOXONE HCL 8-2 MG SL SUBL
1.0000 | SUBLINGUAL_TABLET | Freq: Four times a day (QID) | SUBLINGUAL | Status: DC | PRN
  Administered 2024-01-07 – 2024-01-10 (×12): 1 via SUBLINGUAL
  Filled 2024-01-07 (×14): qty 1

## 2024-01-07 MED ORDER — ENOXAPARIN SODIUM 60 MG/0.6ML IJ SOSY
0.5000 mg/kg | PREFILLED_SYRINGE | INTRAMUSCULAR | Status: DC
  Administered 2024-01-07 – 2024-01-10 (×4): 47.5 mg via SUBCUTANEOUS
  Filled 2024-01-07 (×4): qty 0.6

## 2024-01-07 MED ORDER — AMOXICILLIN 500 MG PO CAPS
1000.0000 mg | ORAL_CAPSULE | Freq: Two times a day (BID) | ORAL | Status: DC
  Administered 2024-01-07 – 2024-01-10 (×7): 1000 mg via ORAL
  Filled 2024-01-07 (×7): qty 2

## 2024-01-07 MED ORDER — CHLORHEXIDINE GLUCONATE CLOTH 2 % EX PADS
6.0000 | MEDICATED_PAD | Freq: Every day | CUTANEOUS | Status: DC
  Administered 2024-01-07 – 2024-01-09 (×3): 6 via TOPICAL

## 2024-01-07 MED ORDER — LIDOCAINE IN D5W 4-5 MG/ML-% IV SOLN
1.0000 mg/min | INTRAVENOUS | Status: DC
  Filled 2024-01-07 (×3): qty 500

## 2024-01-07 NOTE — ED Notes (Signed)
POC urine preg negative 

## 2024-01-07 NOTE — Progress Notes (Signed)
 Anticoagulation monitoring(Lovenox ):  38 yo female ordered Lovenox  40 mg Q24h    Filed Weights   01/07/24 0124  Weight: 95.3 kg (210 lb)   BMI 32   Lab Results  Component Value Date   CREATININE 0.88 01/07/2024   CREATININE 0.78 02/28/2022   CREATININE 0.70 05/30/2021   Estimated Creatinine Clearance: 103.7 mL/min (by C-G formula based on SCr of 0.88 mg/dL). Hemoglobin & Hematocrit     Component Value Date/Time   HGB 13.3 01/07/2024 0128   HCT 39.9 01/07/2024 0128     Per Protocol for Patient with estCrcl > 30 ml/min and BMI > 30, will transition to Lovenox  47.5 mg Q24h.

## 2024-01-07 NOTE — Progress Notes (Signed)
 eLink Physician-Brief Progress Note Patient Name: Breanna Mathews DOB: Dec 09, 1985 MRN: [id number]   Date of Service  01/07/2024  HPI/Events of Note  38 yo F with a history of cervical stenosis, diabetes mellitus, and bipolar disorder presenting to [facility] ED from home via EMS for evaluation of chest pain.  She was found to have persistent VT complicated by shock.  On examination, is mildly hypotensive but otherwise normal vitals saturating 99% on room air.  Results show minor electrolyte abnormalities, leukocytosis, thrombocytosis.  No obvious acute ischemic changes in EKG.  eICU Interventions  Maintain amiodarone  infusion, crystalloid infusion, and phenylephrine  as needed.  Echocardiogram pending, cardiology consultation pending  DVT prophylaxis enoxaparin  GI prophylaxis not indicated     Intervention Category Evaluation Type: New Patient Evaluation  Breanna Mathews 01/07/2024, 3:19 AM

## 2024-01-07 NOTE — ED Triage Notes (Signed)
 Chest pain x1 day worsening today. Given 325 aspirin  by EMS. Runs of [facility] in ambulance. RLL swelling. Recent neck surgery. Hx autism

## 2024-01-07 NOTE — Consult Note (Addendum)
 PHARMACY CONSULT NOTE - FOLLOW UP  Pharmacy Consult for Electrolyte Monitoring and Replacement   Recent Labs: Potassium (mmol/L)  Date Value  01/07/2024 3.4 (L)   Magnesium  (mg/dL)  Date Value  92/95/7974 2.0   Calcium  (mg/dL)  Date Value  92/95/7974 8.5 (L)   Albumin (g/dL)  Date Value  92/95/7974 3.8   Phosphorus (mg/dL)  Date Value  92/95/7974 3.3   Sodium (mmol/L)  Date Value  01/07/2024 137     Assessment: 38 yo F presenting to [facility] ED from home via EMS for evaluation of chest pain. She has had some re-occurring RLE edema. Pt had persistent ventricular tachycardia.   On amio gtt.  LR @ 75 ml/hr x 24 hours.   Goal of Therapy:  K+ > 4 Mg > 2.   Plan:  Kcl 40 mEq x 1 per medical team. Will order Kcl 20 mEq x 1 in the PM.  F/u with AM labs.    Cathaleen GORMAN Blanch ,PharmD Clinical Pharmacist 01/07/2024 7:18 AM

## 2024-01-07 NOTE — H&P (Addendum)
 NAME:  Breanna Mathews, MRN:  [id number], DOB:  05/13/1986, LOS: 0 ADMISSION DATE:  01/07/2024, CONSULTATION DATE:  01/07/24 REFERRING MD:  Dr. Gordan, CHIEF COMPLAINT:  Chest pain   History of Present Illness:  38 yo F presenting to [facility] ED from home via EMS for evaluation of chest pain.  History provided per chart review and patient bedside report. Patient describes being in her normal state of health until a few days ago when her heart started to feel weird. She describes this as intermittent 6/10 chest pain that aches with pressure on the right-medial side of her chest. She also describes palpitations with correlating vision changes where she sees black spots. No rhyme or reason to these events that she has noticed. At her post-op appt last week, someone noted that her HR was bouncing around between 20 and 120, but her BP was stable. Around 22:00 on 7/3 she describes persistent chest pain 6/10 unable to get comfortable, pressure/ squeezing internally, constant ache. She does endorse a headache and some dyspnea earlier when laying on her back- but otherwise no shortness of breath. Only other pain is at her surgical site, site is clean dry and intact and she has not noticed any drainage or redness. She does have recent RLE edema and redness with discomfort- unclear because initially reported pain, then denied pain in leg. She has had some re-occurring RLE edema which she is on lasix for. She denies falls, LOC, cough, fever/chills, abdominal pain/ nausea/ vomiting/ diarrhea or dizziness. She has been taking all her medication as prescribed including a significant chronic pain medication regimen.   ED course: Upon arrival patient alert and responsive with self-sustained runs of Vtach, transitioning in and out of bigeminy/ trigeminy with underlying NSR. Amiodarone  drip started. Las largely unremarkable with mild leukocytosis. Cardiology consulted due to difficulty controlling Vtach and concern for  increased frequency since arrival. Recommendation for lidocaine  bolus and drip in addition to amiodarone  drip.  Medications given: amiodarone  drip & 1 L IVF bolus Initial Vitals: 98.2, 16, 50, 100/49 & 94% on RA Significant labs: (Labs/ Imaging personally reviewed) I, Jenita Ruth Rust-Chester, AGACNP-BC, personally viewed and interpreted this ECG. EKG Interpretation: Date: 01/07/24, EKG Time: 01:19, Rate: 108, Rhythm: ST, QRS Axis:  normal, Intervals: borderline prolonged PR, ST/T Wave abnormalities: none, Narrative Interpretation: ST with bigeminy Chemistry: Na+: 136, K+: 3.7, BUN/Cr.: 18/ 0.88, Serum CO2/ AG: 27/ 11, Mg: 2.1 Hematology: WBC: 11.1, Hgb: 13.3,  Troponin: 3, BNP: 8, Lactic/ PCT: 1.8/ <0.10,   CXR 01/07/24: no active disease BLE Venous US  01/07/24: negative for DVT  PCCM consulted for admission due to persistent Ventricular Tachycardia on amiodarone  drip.  Pertinent  Medical History  Cervical Stenosis s/p C4 corpectomy & C5-6, C6-7 ACDF (12/2023) Bipolar 1 disorder Anxiety & depression Chronic Migraine Seizure disorder (not on AED- remote history) HTN Lupus T2DM insulin  dependent Every day smoker (30 pack history) Chronic pain  Significant Hospital Events: Including procedures, antibiotic start and stop dates in addition to other pertinent events   01/07/24: Admit to ICU with persistent Ventricular Tachycardia on amiodarone    Interim History / Subjective:  Patient alert and oriented, but fatigued. Plan of care discussed, all questions and concerns answered at this time.  Objective    Blood pressure (!) 98/50, pulse (!) 44, temperature 98.2 F (36.8 C), temperature source Oral, resp. rate 16, height 5' 8 (1.727 m), weight 95.3 kg, SpO2 95%, unknown if currently breastfeeding.       No intake or  output data in the 24 hours ending 01/07/24 0223 Filed Weights   01/07/24 0124  Weight: 95.3 kg    Examination: General: Adult female, acutely ill, lying in bed  NAD HEENT: MM pink/moist, anicteric, atraumatic, neck supple, surgical site on anterior neck clean/dry/intact Neuro: A&O x 4, able to follow commands, PERRL +3, MAE CV: s1s2 regular , NSR with intermittent runs of Vtach, bigeminy and trigeminy on monitor, no r/m/g Pulm: Regular, non labored on RA , breath sounds clear-BUL & diminished-BLL GI: soft, rounded, non tender, bs x 4 Skin: surgical site on anterior neck: clean/dry and intact, mild redness on RLE ankle and calf, no rashes/lesions noted Extremities: warm/dry, pulses + 2 R/P, trace edema noted RLE  Resolved problem list   Assessment and Plan  Persistent Ventricular Tachycardia Hypotension - improved PMHx: HTN - continue amiodarone  drip, lidocaine  bolus followed by drip ordered by cardiology on call due to persistent tachycardia ~ upon arrival to ICU patient in NSR without any PVC's or runs of VTach. Lidocaine  bolus had not been administered yet- will hold for now, consider lidocaine  if patient develops persistent Vtach. - continuous cardiac monitoring - daily BMP, maintain K+ > 4.0 and Mg >2.0 - 40 meq K+ ordered  - consider phenylephrine  drip to maintain MAP > 65 - hold outpatient anti-hypertensive medications, consider restarting as patient stabilizes - Echocardiogram ordered - Cardiology consulted, appreciate input  Cervical Stenosis s/p C4 corpectomy & C5-6, C6-7 ACDF (12/2023) Chronic Pain - continue Methocarbamol , reduce dose to 500 mg Q 6 instead of 1500 mg BID. Consider increasing dose to outpatient regimen as patient stabilizes - continue Subutek 8 mg 1 tab BID with 8 mg/2mg  SL film QID PRN with instructions to hold PRN if SBP <100, - consider increasing Subutek back to outpatient regimen of 1 tab QID as patient stabilizes - will add ketorolac  Q 6 to assist in pain control with reduced regimen - monitor for signs of withdrawal   Type 2 Diabetes Mellitus Hemoglobin A1C: pending - Monitor CBG Q 4 hours - SSI moderate  dosing - target range while in ICU: 140-180 - follow ICU hyper/hypo-glycemia protocol  Anxiety & Depression Bipolar 1 Disorder - continue outpatient Cymbalta  - continue PRN ativan  PO  Best Practice (right click and Reselect all SmartList Selections daily)  Diet/type: NPO w/ oral meds DVT prophylaxis LMWH Pressure ulcer(s): N/A GI prophylaxis: N/A Lines: N/A Foley:  N/A Code Status:  full code Last date of multidisciplinary goals of care discussion [01/07/24]  Labs   CBC: Recent Labs  Lab 01/07/24 0128  WBC 11.0*  NEUTROABS 5.5  HGB 13.3  HCT 39.9  MCV 90.1  PLT 403*    Basic Metabolic Panel: Recent Labs  Lab 01/07/24 0128  NA 136  K 3.7  CL 98  CO2 27  GLUCOSE 140*  BUN 18  CREATININE 0.88  CALCIUM  9.4  MG 2.1   GFR: Estimated Creatinine Clearance: 103.7 mL/min (by C-G formula based on SCr of 0.88 mg/dL). Recent Labs  Lab 01/07/24 0128  WBC 11.0*  LATICACIDVEN 1.8    Liver Function Tests: Recent Labs  Lab 01/07/24 0128  AST 22  ALT 25  ALKPHOS 101  BILITOT 0.7  PROT 7.5  ALBUMIN 3.8   No results for input(s): LIPASE, AMYLASE in the last 168 hours. No results for input(s): AMMONIA in the last 168 hours.  ABG No results found for: PHART, PCO2ART, PO2ART, HCO3, TCO2, ACIDBASEDEF, O2SAT   Coagulation Profile: Recent Labs  Lab 01/07/24 0128  INR 0.9    Cardiac Enzymes: No results for input(s): CKTOTAL, CKMB, CKMBINDEX, TROPONINI in the last 168 hours.  HbA1C: No results found for: HGBA1C  CBG: No results for input(s): GLUCAP in the last 168 hours.  Review of Systems: positives in BOLD  Gen: Denies fever, chills, weight change, fatigue, night sweats HEENT: Denies blurred vision, double vision, hearing loss, tinnitus, sinus congestion, rhinorrhea, sore throat, neck stiffness, dysphagia, black spots in vision PULM: Denies shortness of breath, cough, sputum production, hemoptysis, wheezing CV: Denies  chest pain, edema, orthopnea, paroxysmal nocturnal dyspnea, palpitations GI: Denies abdominal pain, nausea, vomiting, diarrhea, hematochezia, melena, constipation, change in bowel habits GU: Denies dysuria, hematuria, polyuria, oliguria, urethral discharge Endocrine: Denies hot or cold intolerance, polyuria, polyphagia or appetite change Derm: Denies rash, dry skin, scaling or peeling skin change Heme: Denies easy bruising, bleeding, bleeding gums Neuro: Denies headache, numbness, weakness, slurred speech, loss of memory or consciousness  Past Medical History:  She,  has a past medical history of Angina of effort, Anxiety, Bipolar disorder with depression (HCC), Chronic back pain, GERD (gastroesophageal reflux disease), Hypertension, and Mitral valve prolapse.   Surgical History:   Past Surgical History:  Procedure Laterality Date   APPENDECTOMY     BACK SURGERY     CESAREAN SECTION     KNEE ARTHROPLASTY Left    x 2   POLYPECTOMY     TONSILLECTOMY       Social History:   reports that she has been smoking cigarettes. She has never used smokeless tobacco. She reports that she does not drink alcohol and does not use drugs.   Family History:  Her family history is not on file.   Allergies Allergies  Allergen Reactions   Ondansetron  Other (See Comments)    Prolonged QT syndrome - must monitor ECG if patient is taking this medication   Buspirone Other (See Comments)   Gabapentin  Other (See Comments)    Pt states it makes seizures worse per pt   Keflex [Cephalexin] Rash   Lithium Palpitations    Seizures    Opana [Oxymorphone Hcl] Palpitations   Oxymorphone Other (See Comments)     Home Medications  Prior to Admission medications   Medication Sig Start Date End Date Taking? Authorizing Provider  buprenorphine  (SUBUTEX ) 8 MG SUBL SL tablet Place 8 mg under the tongue 2 (two) times daily. 11/02/16   [provider]  Buprenorphine  HCl-Naloxone  HCl 8-2 MG FILM Place 1  Film under the tongue daily. Takes at lunch 05/10/21   [provider]  carisoprodol  (SOMA ) 350 MG tablet Take 1 tablet by mouth 3 (three) times daily. 04/21/21   [provider]  diphenhydrAMINE  (BENADRYL ) 50 MG capsule Take 50 mg by mouth 3 (three) times daily.    [provider]  DULoxetine  (CYMBALTA ) 60 MG capsule Take 60 mg by mouth daily. 12/13/12   [provider]  LEVEMIR FLEXPEN 100 UNIT/ML FlexPen Inject into the skin. 05/30/22 05/30/23  [provider]  LORazepam  (ATIVAN ) 0.5 MG tablet Take 0.5 mg by mouth every 6 (six) hours as needed.    [provider]  metFORMIN (GLUCOPHAGE-XR) 500 MG 24 hr tablet Take 500 mg by mouth 2 (two) times daily. 04/29/21   [provider]  nicotine  (NICODERM CQ  - DOSED IN MG/24 HOURS) 21 mg/24hr patch Place 1 patch (21 mg total) onto the skin daily. 05/12/21   Jhonny Calvin NOVAK, MD  olmesartan -hydrochlorothiazide  (BENICAR  HCT) 40-25 MG tablet Take 1 tablet by  mouth daily.    [provider]  sucralfate (CARAFATE) 1 g tablet Take 1 g by mouth 4 (four) times daily.    [provider]  SUMAtriptan (IMITREX) 100 MG tablet Take 100 mg by mouth once. 03/19/22 09/15/22  [provider]  topiramate (TOPAMAX) 25 MG tablet Take 25 mg by mouth daily. 03/19/22   [provider]     Critical care time: 65 minutes     Jenita Jama Meek, AGACNP-BC Acute Care Nurse Practitioner [facility]   [phone] / [phone] Please see Amion for details.

## 2024-01-07 NOTE — Progress Notes (Signed)
  Echocardiogram 2D Echocardiogram has been performed. Unable to use Definity IV ultrasound imaging agent at this time. Per the RN unable to pause IV meds at this time.  Breanna Mathews 01/07/2024, 8:09 AM

## 2024-01-07 NOTE — ED Provider Notes (Signed)
 [facility] Provider Note    Event Date/Time   First MD Initiated Contact with Patient 01/07/24 0122     (approximate)   History   Chest Pain   HPI Breanna Mathews is a 38 y.o. female with an extensive chronic medical history including a form of autism, recent neck surgery, chronic pain, obesity, diabetes, and hypertension.  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    She presents by EMS for chest discomfort and rapid heart rate and palpitations.  EMS arrived and found that she was having intermittent runs of ventricular tachycardia.  Initially, she had a sinus rhythm with PVCs.  This devolved to bigeminy, then trigeminy, then sustained ventricular tachycardia.  She had no significant clinical change during that time, though she could feel her heart pounding.  They initiated a bolus of amiodarone  150 mg IV which she was still receiving when she arrived.  She said she could still feel her heart speeding up and slowing down.  She has a little bit of chest discomfort but not severe pain.  She denies any shortness of breath.  She said she is not on any new medications.       Physical Exam   Triage Vital Signs: ED Triage Vitals  Encounter Vitals Group     BP 01/07/24 0120 139/60     Girls Systolic BP Percentile --      Girls Diastolic BP Percentile --      Boys Systolic BP Percentile --      Boys Diastolic BP Percentile --      Pulse Rate 01/07/24 0120 (!) 49     Resp 01/07/24 0120 13     Temp 01/07/24 0123 98.2 F (36.8 C)     Temp Source 01/07/24 0123 Oral     SpO2 01/07/24 0118 99 %     Weight 01/07/24 0124 95.3 kg (210 lb)     Height 01/07/24 0124 1.727 m (5' 8)     Head Circumference --      Peak Flow --      Pain Score 01/07/24 0124 3     Pain Loc --      Pain Education --      Exclude from Growth Chart --     Most recent vital signs: Vitals:   01/07/24 0249  01/07/24 0700  BP: (!) 109/43 (!) 91/54  Pulse: (!) 29 91  Resp: 13 20  Temp:    SpO2: 98% 92%    General: Awake, alert, no apparent distress.  Conversant and pleasant demeanor. CV:  Good peripheral perfusion.  Irregular but tachycardic rhythm upon auscultation. Resp:  Normal effort. Speaking easily and comfortably, no accessory muscle usage nor intercostal retractions.  Lungs clear to auscultation bilaterally. Abd:  No distention.  Obese, nontender to palpation. Other:  Recent well-appearing surgical scar on anterior neck.   ED Results / Procedures / Treatments   Labs (all labs ordered are listed, but only abnormal results are displayed) Labs Reviewed  HEMOGLOBIN A1C - Abnormal; Notable for the following components:      Result Value   Hgb A1c MFr Bld 6.4 (*)    All other components within normal limits  CBC WITH DIFFERENTIAL/PLATELET - Abnormal; Notable for the following components:   WBC 11.0 (*)    Platelets 403 (*)    Lymphs Abs 4.5 (*)    All  other components within normal limits  COMPREHENSIVE METABOLIC PANEL WITH GFR - Abnormal; Notable for the following components:   Glucose, Bld 140 (*)    All other components within normal limits  LIPID PANEL - Abnormal; Notable for the following components:   Triglycerides 272 (*)    HDL 24 (*)    VLDL 54 (*)    All other components within normal limits  CBC - Abnormal; Notable for the following components:   RBC 3.80 (*)    Hemoglobin 11.4 (*)    HCT 33.8 (*)    All other components within normal limits  BASIC METABOLIC PANEL WITH GFR - Abnormal; Notable for the following components:   Potassium 3.4 (*)    Glucose, Bld 108 (*)    Calcium  8.5 (*)    All other components within normal limits  GLUCOSE, CAPILLARY - Abnormal; Notable for the following components:   Glucose-Capillary 113 (*)    All other components within normal limits  GLUCOSE, CAPILLARY - Abnormal; Notable for the following components:   Glucose-Capillary  115 (*)    All other components within normal limits  MRSA NEXT GEN BY PCR, NASAL  MAGNESIUM   PROTIME-INR  APTT  LACTIC ACID, PLASMA  BRAIN NATRIURETIC PEPTIDE  PROCALCITONIN  MAGNESIUM   PHOSPHORUS  HIV ANTIBODY (ROUTINE TESTING W REFLEX)  POC URINE PREG, ED  TROPONIN I (HIGH SENSITIVITY)  TROPONIN I (HIGH SENSITIVITY)     EKG  ED ECG REPORT I, Darleene Dome, the attending physician, personally viewed and interpreted this ECG.  Date: 01/07/2024 EKG Time: 1:19 AM Rate: 108 Rhythm: Sinus tachycardia with ventricular bigeminy QRS Axis: normal Intervals: normal ST/T Wave abnormalities: Non-specific ST segment / T-wave changes, but no clear evidence of acute ischemia. Narrative Interpretation: no definitive evidence of acute ischemia; does not meet STEMI criteria.     RADIOLOGY See ED course for details   PROCEDURES:  Critical Care performed: Yes, see critical care procedure note(s)  .Critical Care  Performed by: Dome Darleene, MD Authorized by: Dome Darleene, MD   Critical care provider statement:    Critical care time (minutes):  60   Critical care time was exclusive of:  Separately billable procedures and treating other patients   Critical care was necessary to treat or prevent imminent or life-threatening deterioration of the following conditions:  Circulatory failure   Critical care was time spent personally by me on the following activities:  Development of treatment plan with patient or surrogate, evaluation of patient's response to treatment, examination of patient, obtaining history from patient or surrogate, ordering and performing treatments and interventions, ordering and review of laboratory studies, ordering and review of radiographic studies, pulse oximetry, re-evaluation of patient's condition and review of old charts .1-3 Lead EKG Interpretation  Performed by: Dome Darleene, MD Authorized by: Dome Darleene, MD     Interpretation: abnormal     ECG  rate:  120   ECG rate assessment: tachycardic     Rhythm: ventricular tachycardia     Conduction: normal       IMPRESSION / MDM / ASSESSMENT AND PLAN / ED COURSE  I reviewed the triage vital signs and the nursing notes.                              Differential diagnosis includes, but is not limited to, electrolyte abnormality, cardiac conduction abnormality, medication or drug side effect, PE, ACS.  Patient's presentation is most consistent with  acute presentation with potential threat to life or bodily function.  Labs/studies ordered: High-sensitivity troponin, magnesium , CBC with differential, CMP, hemoglobin A1c, lipid panel, coagulation studies, lactic acid, BNP  Interventions/Medications given:  Medications  amiodarone  (NEXTERONE  PREMIX) 360-4.14 MG/200ML-% (1.8 mg/mL) IV infusion (60 mg/hr Intravenous New Bag/Given 01/07/24 0344)    Followed by  amiodarone  (NEXTERONE  PREMIX) 360-4.14 MG/200ML-% (1.8 mg/mL) IV infusion (has no administration in time range)  docusate sodium  (COLACE) capsule 100 mg (has no administration in time range)  polyethylene glycol (MIRALAX  / GLYCOLAX ) packet 17 g (has no administration in time range)  enoxaparin  (LOVENOX ) injection 47.5 mg (has no administration in time range)  Chlorhexidine  Gluconate Cloth 2 % PADS 6 each (has no administration in time range)  methocarbamol  (ROBAXIN ) tablet 500 mg (500 mg Oral Given 01/07/24 0400)  LORazepam  (ATIVAN ) tablet 0.5 mg (has no administration in time range)  DULoxetine  (CYMBALTA ) DR capsule 60 mg (has no administration in time range)  insulin  aspart (novoLOG ) injection 0-15 Units ( Subcutaneous Not Given 01/07/24 0446)  ketorolac  (TORADOL ) 30 MG/ML injection 30 mg (30 mg Intravenous Given 01/07/24 0517)  buprenorphine  (SUBUTEX ) sublingual tablet 8 mg (8 mg Sublingual Given 01/07/24 0407)  buprenorphine -naloxone  (SUBOXONE ) 8-2 mg per SL tablet 1 tablet (has no administration in time range)  phenylephrine   (NEO-SYNEPHRINE) 20mg /NS premix infusion ( Intravenous Not Given 01/07/24 0503)  lactated ringers  infusion ( Intravenous New Bag/Given 01/07/24 0503)  potassium chloride  SA (KLOR-CON  M) CR tablet 40 mEq (has no administration in time range)  potassium chloride  SA (KLOR-CON  M) CR tablet 20 mEq (has no administration in time range)  sodium chloride  0.9 % bolus 1,000 mL (1,000 mLs Intravenous New Bag/Given 01/07/24 0214)  potassium chloride  SA (KLOR-CON  M) CR tablet 40 mEq (40 mEq Oral Given 01/07/24 0400)  lactated ringers  bolus 500 mL (500 mLs Intravenous New Bag/Given 01/07/24 0424)    (Note:  hospital course my include additional interventions and/or labs/studies not listed above.)   I watched the patient on the monitor after her arrival and could see her developing more PVCs and then developing into bigeminy and trigeminy and then V. tach as described by EMS.  The patient is actively getting the amiodarone  bolus followed by initiation of infusion as per protocol.  Patient's blood pressure is stable and her mental status is stable even when she goes into nonsustained V. tach which may range from 4-5 beats to as many as 20 beats.  No indication for electrical cardioversion at this time.  Her QTc is normal which is reassuring.  The patient is on the cardiac monitor to evaluate for evidence of arrhythmia and/or significant heart rate changes.  Delayed documentation: Electrolytes are normal including magnesium .  Normal renal function, normal lactic acid.  No significant leukocytosis and hemoglobin is normal.  Patient is having increasingly long runs of nonsustained ventricular tachycardia.  I will discuss case with PCCM.  She remains stable despite her tachyarrhythmia.  Clinical Course as of 01/07/24 0730  Fri Jan 07, 2024  0212 Blood pressure trending downward, likely the result of the amiodarone , ordering 1 L normal saline IV bolus [CF]  0225 Consulted Jenita Ruth Rust-Chester, ICU NP, and discussed  case in person.  Agreed with plan for amio, recommended calling cardiology as well to discuss repeat amio 150 mg bolus.  Paged Dr. Gardenia. [CF]  0236 US  Venous Img Lower Bilateral I independently viewed and interpreted the patient's DVT ultrasound, and I see no evidence of thrombus.  Confirmed by radiology. [CF]  0301 Consulted by phone with Dr. Lovell [CF]    Clinical Course User Index [CF] Gordan Huxley, MD     FINAL CLINICAL IMPRESSION(S) / ED DIAGNOSES   Final diagnoses:  Ventricular tachycardia (HCC)     Rx / DC Orders   ED Discharge Orders     None        Note:  This document was prepared using Dragon voice recognition software and may include unintentional dictation errors.   Gordan Huxley, MD 01/07/24 0730

## 2024-01-07 NOTE — Consult Note (Signed)
 Cardiology Consultation   Patient ID: Beautiful Pensyl MRN: [id number]; DOB: 1985-10-15  Admit date: 01/07/2024 Date of Consult: 01/07/2024  PCP:  Amil Sovereign, MD   [facility] Providers Cardiologist:  None      New consult completed by Dr Perla  Patient Profile: Breanna Mathews is a 38 y.o. female with a hx of cervical stenosis s/p C4 corpectomy and C5-6, C6-7 ACDF (12/2023), bipolar disorder, anxiety and depression, chronic migraine, seizure disorder, hypertension, lupus, insulin -dependent type 2 diabetes, current smoker, and chronic pain who is being seen 01/07/2024 for the evaluation of nonsustained V. tach at the request of B. Rust-Chester, NP.  History of Present Illness: Breanna Mathews presented to the [facility] emergency department from home via EMS for the evaluation of chest pain.  She describes being in her normal state of health until a few days ago when her heart started to feel like it was racing.  She said this was accompanied with intermittent chest pain rated 6 out of 10 on the pain scale.  It was a pressure on the right medial side of her chest.  She also had palpitations with correlating vision changes where she would see black spots.  She is 10 days postop from neck surgery.  She said that her postoperative appointment the week prior someone noted her heart rate was bouncing between 20 and 120 but her blood pressure was stable.  Around 10:00 on 7/3 she started having persistent chest discomfort rated 6 out of 10 unable to get comfortable.  She endorsed a headache and some dyspnea early when lying on her back but otherwise no shortness of breath.  The only pain she continued to have was at her surgical site.  She also noted recent right lower extremity edema and redness with discomfort she had some reoccurring edema which she was on Lasix for.  Denies any other associated symptoms.  Initial vital signs revealed blood pressure 139/60, pulse of 49, respirations of 13,  temperature of 98.2.  Pertinent labs revealed hemoglobin A1c of 6.4, WBCs of 11.0, platelets 4 3, blood glucose 140, triglycerides 272, HDL 24, LDL 54, hemoglobin of 11.4, potassium 3.4, blood glucose 108, calcium  8.5.  She was placed on cardiac monitor and developed more PVCs developing into bigeminy and trigeminy and then to nonsustained V. tach.  She was started on amiodarone  after a bolus and then drip per protocol.  Nonsustained V. tach was noted to range from 45 beats as many as 20 beats.  No indication for cardioversion at the time QTc was normal which was reassuring.  Electrolytes were normal including magnesium  and phosphorus.  Bilateral lower extremity ultrasound was negative for DVT.  She was treated with IV fluid, potassium supplementation, and repeat amiodarone  bolus.  Cardiology was consulted to assist with ongoing management of nonsustained VT.   Past Medical History:  Diagnosis Date   Angina of effort    Anxiety    Bipolar disorder with depression (HCC)    Chronic back pain    GERD (gastroesophageal reflux disease)    Hypertension    Mitral valve prolapse     Past Surgical History:  Procedure Laterality Date   APPENDECTOMY     BACK SURGERY     CESAREAN SECTION     KNEE ARTHROPLASTY Left    x 2   POLYPECTOMY     TONSILLECTOMY       Home Medications:  Prior to Admission medications   Medication Sig Start Date End Date Taking? Authorizing Provider  buprenorphine  (SUBUTEX ) 8 MG SUBL SL tablet Place 8 mg under the tongue 2 (two) times daily. 11/02/16   [provider]  Buprenorphine  HCl-Naloxone  HCl 8-2 MG FILM Place 1 Film under the tongue daily. Takes at lunch 05/10/21   [provider]  carisoprodol  (SOMA ) 350 MG tablet Take 1 tablet by mouth 3 (three) times daily. 04/21/21   [provider]  diphenhydrAMINE  (BENADRYL ) 50 MG capsule Take 50 mg by mouth 3 (three) times daily.    [provider]  DULoxetine  (CYMBALTA ) 60 MG capsule Take  60 mg by mouth daily. 12/13/12   [provider]  LEVEMIR FLEXPEN 100 UNIT/ML FlexPen Inject into the skin. 05/30/22 05/30/23  [provider]  LORazepam  (ATIVAN ) 0.5 MG tablet Take 0.5 mg by mouth every 6 (six) hours as needed.    [provider]  metFORMIN (GLUCOPHAGE-XR) 500 MG 24 hr tablet Take 500 mg by mouth 2 (two) times daily. 04/29/21   [provider]  nicotine  (NICODERM CQ  - DOSED IN MG/24 HOURS) 21 mg/24hr patch Place 1 patch (21 mg total) onto the skin daily. 05/12/21   Breanna Calvin NOVAK, MD  olmesartan -hydrochlorothiazide  (BENICAR  HCT) 40-25 MG tablet Take 1 tablet by mouth daily.    [provider]  sucralfate (CARAFATE) 1 g tablet Take 1 g by mouth 4 (four) times daily.    [provider]  SUMAtriptan (IMITREX) 100 MG tablet Take 100 mg by mouth once. 03/19/22 09/15/22  [provider]  topiramate (TOPAMAX) 25 MG tablet Take 25 mg by mouth daily. 03/19/22   [provider]    Scheduled Meds:  buprenorphine   8 mg Sublingual BID   Chlorhexidine  Gluconate Cloth  6 each Topical Daily   DULoxetine   60 mg Oral Daily   enoxaparin  (LOVENOX ) injection  0.5 mg/kg Subcutaneous Q24H   insulin  aspart  0-15 Units Subcutaneous Q4H   ketorolac   30 mg Intravenous Q6H   methocarbamol   500 mg Oral Q6H   potassium chloride   20 mEq Oral Once   potassium chloride   40 mEq Oral Once   Continuous Infusions:  amiodarone      lactated ringers  75 mL/hr at 01/07/24 0503   phenylephrine  (NEO-SYNEPHRINE) Adult infusion 25 mcg/min (01/07/24 0740)   PRN Meds: buprenorphine -naloxone , docusate sodium , LORazepam , polyethylene glycol  Allergies:    Allergies  Allergen Reactions   Ondansetron  Other (See Comments)    Prolonged QT syndrome - must monitor ECG if patient is taking this medication   Buspirone Other (See Comments)   Gabapentin  Other (See Comments)    Pt states it makes seizures worse per pt   Keflex [Cephalexin] Rash    Lithium Palpitations    Seizures    Opana [Oxymorphone Hcl] Palpitations   Oxymorphone Other (See Comments)    Social History:   Social History   Socioeconomic History   Marital status: Married    Spouse name: Not on file   Number of children: Not on file   Years of education: Not on file   Highest education level: Not on file  Occupational History   Not on file  Tobacco Use   Smoking status: Every Day    Current packs/day: 0.50    Types: Cigarettes   Smokeless tobacco: Never   Tobacco comments:    2 cigarettes/day  Vaping Use   Vaping status: Never Used  Substance and Sexual Activity   Alcohol use: No   Drug use: No   Sexual activity: Yes  Other Topics Concern  Not on file  Social History Narrative   Not on file   Social Drivers of Health   Financial Resource Strain: Low Risk  (12/28/2023)   Received from [facility]   Overall Financial Resource Strain (CARDIA)    Difficulty of Paying Living Expenses: Not hard at all  Food Insecurity: No Food Insecurity (12/28/2023)   Received from [facility]   Hunger Vital Sign    Within the past 12 months, you worried that your food would run out before you got the money to buy more.: Never true    Within the past 12 months, the food you bought just didn't last and you didn't have money to get more.: Never true  Transportation Needs: No Transportation Needs (12/28/2023)   Received from [facility] - Transportation    In the past 12 months, has lack of transportation kept you from medical appointments or from getting medications?: No    Lack of Transportation (Non-Medical): No  Physical Activity: Insufficiently Active (08/27/2023)   Received from [facility]   Exercise Vital Sign    On average, how many days per week do you engage in moderate to strenuous exercise (like a brisk walk)?: 3 days    On average, how many minutes do you engage in  exercise at this level?: 30 min  Stress: Stress Concern Present (08/27/2023)   Received from [facility] - Occupational Stress Questionnaire    Feeling of Stress : Rather much  Social Connections: Moderately Isolated (08/27/2023)   Received from [facility]   Social Connection and Isolation Panel    In a typical week, how many times do you talk on the phone with family, friends, or neighbors?: More than three times a week    How often do you get together with friends or relatives?: Never    How often do you attend church or religious services?: Never    Do you belong to any clubs or organizations such as church groups, unions, fraternal or athletic groups, or school groups?: No    How often do you attend meetings of the clubs or organizations you belong to?: Never    Are you married, widowed, divorced, separated, never married, or living with a partner?: Married  Intimate Partner Violence: Not on file    Family History:   No family history on file.   ROS:  Please see the history of present illness.  Review of Systems  Constitutional:  Positive for malaise/fatigue.  Respiratory:  Positive for shortness of breath.   Cardiovascular:  Positive for chest pain and palpitations.  Musculoskeletal:  Positive for back pain.  Neurological:  Positive for weakness.    All other ROS reviewed and negative.     Physical Exam/Data: Vitals:   01/07/24 0215 01/07/24 0230 01/07/24 0249 01/07/24 0700  BP: (!) 98/50 (!) 108/35 (!) 109/43 (!) 91/54  Pulse: (!) 44  (!) 29 91  Resp: 16 13 13 20   Temp:      TempSrc:      SpO2: 95% 99% 98% 92%  Weight:      Height:       No intake or output data in the 24 hours ending 01/07/24 0741    01/07/2024    1:24 AM 07/23/2022    9:17 AM 02/28/2022   11:26 AM  Last 3 Weights  Weight (lbs) 210 lb 224  lb 12.8 oz 236 lb  Weight (kg) 95.255 kg 101.969 kg 107.049 kg     Body mass  index is 31.93 kg/m.  General:  Well nourished, well developed, in no acute distress, ill-appearing HEENT: normal Neck: no JVD Vascular: No carotid bruits; Distal pulses 2+ bilaterally Cardiac:  normal S1, S2; RRR; no murmur  Lungs:  clear to auscultation bilaterally, no wheezing, rhonchi or rales, respirations are unlabored at rest on room air Abd: soft, nontender, no hepatomegaly  Ext: trace edema BLE Musculoskeletal:  No deformities, BUE and BLE strength normal and equal Skin: warm and dry  Neuro:  CNs 2-12 intact, no focal abnormalities noted Psych:Flat affect   EKG:  The EKG was personally reviewed and demonstrates: Sinus tachycardia with rate of 108 with first-degree AV block and ventricular bigeminy Telemetry:  Telemetry was personally reviewed and demonstrates: Sinus rhythm rates of 70-80 with short bursts of NSVT 4-6 beats with unifocal PVCs in bigeminal and trigeminal patterns noted  Relevant CV Studies: Echocardiogram ordered and pending with further recommendations to follow  Laboratory Data: High Sensitivity Troponin:   Recent Labs  Lab 01/07/24 0128 01/07/24 0505  TROPONINIHS 3 4     Chemistry Recent Labs  Lab 01/07/24 0128 01/07/24 0505  NA 136 137  K 3.7 3.4*  CL 98 102  CO2 27 25  GLUCOSE 140* 108*  BUN 18 16  CREATININE 0.88 0.67  CALCIUM  9.4 8.5*  MG 2.1 2.0  GFRNONAA >60 >60  ANIONGAP 11 10    Recent Labs  Lab 01/07/24 0128  PROT 7.5  ALBUMIN 3.8  AST 22  ALT 25  ALKPHOS 101  BILITOT 0.7   Lipids  Recent Labs  Lab 01/07/24 0127  CHOL 166  TRIG 272*  HDL 24*  LDLCALC 88  CHOLHDL 6.9    Hematology Recent Labs  Lab 01/07/24 0128 01/07/24 0505  WBC 11.0* 9.6  RBC 4.43 3.80*  HGB 13.3 11.4*  HCT 39.9 33.8*  MCV 90.1 88.9  MCH 30.0 30.0  MCHC 33.3 33.7  RDW 12.9 12.7  PLT 403* 310   Thyroid No results for input(s): TSH, FREET4 in the last 168 hours.  BNP Recent Labs  Lab 01/07/24 0127  BNP 8.0    DDimer No  results for input(s): DDIMER in the last 168 hours.  Radiology/Studies:  US  Venous Img Lower Bilateral Result Date: 01/07/2024 CLINICAL DATA:  redness, swelling, pain in RLE, hx of recent surgery and extended recovery EXAM: BILATERAL LOWER EXTREMITY VENOUS DOPPLER ULTRASOUND TECHNIQUE: Gray-scale sonography with graded compression, as well as color Doppler and duplex ultrasound were performed to evaluate the lower extremity deep venous systems from the level of the common femoral vein and including the common femoral, femoral, profunda femoral, popliteal and calf veins including the posterior tibial, peroneal and gastrocnemius veins when visible. The superficial great saphenous vein was also interrogated. Spectral Doppler was utilized to evaluate flow at rest and with distal augmentation maneuvers in the common femoral, femoral and popliteal veins. COMPARISON:  None Available. FINDINGS: RIGHT LOWER EXTREMITY Common Femoral Vein: No evidence of thrombus. Normal compressibility, respiratory phasicity and response to augmentation. Saphenofemoral Junction: No evidence of thrombus. Normal compressibility and flow on color Doppler imaging. Profunda Femoral Vein: No evidence of thrombus. Normal compressibility and flow on color Doppler imaging. Femoral Vein: No evidence of thrombus. Normal compressibility, respiratory phasicity and response to augmentation. Popliteal Vein: No evidence of thrombus. Normal compressibility, respiratory phasicity and response to augmentation. Calf Veins: No evidence of  thrombus. Normal compressibility and flow on color Doppler imaging. LEFT LOWER EXTREMITY Common Femoral Vein: No evidence of thrombus. Normal compressibility, respiratory phasicity and response to augmentation. Saphenofemoral Junction: No evidence of thrombus. Normal compressibility and flow on color Doppler imaging. Profunda Femoral Vein: No evidence of thrombus. Normal compressibility and flow on color Doppler imaging.  Femoral Vein: No evidence of thrombus. Normal compressibility, respiratory phasicity and response to augmentation. Popliteal Vein: No evidence of thrombus. Normal compressibility, respiratory phasicity and response to augmentation. Calf Veins: No evidence of thrombus. Normal compressibility and flow on color Doppler imaging. IMPRESSION: No evidence of deep venous thrombosis in either lower extremity. Electronically Signed   By: Gilmore GORMAN Molt M.D.   On: 01/07/2024 02:30   DG Chest Port 1 View Result Date: 01/07/2024 CLINICAL DATA:  Chest pain x1 day. EXAM: PORTABLE CHEST 1 VIEW COMPARISON:  Nov 16, 2019 FINDINGS: The heart size and mediastinal contours are within normal limits. Both lungs are clear. Postoperative changes are seen within the lower thoracic spine. The visualized skeletal structures are otherwise unremarkable. IMPRESSION: No active disease. Electronically Signed   By: Suzen Dials M.D.   On: 01/07/2024 01:57     Assessment and Plan: Persistent ventricular tachycardia -Patient was started on amiodarone  infusion in the emergency department with bolus x 2 -Continue to have PVCs short bursts of NSVT and bigeminal PVCs noted on telemetry since admission -Electrolytes have remained stable -Maintaining keeping potassium greater than 4 less than 5 magnesium  at 2 -Potassium supplementations were given overnight -Echocardiogram ordered and pending with further recommendations to follow -Continue on telemetry monitoring -Dr. Inocencio from EP recommended discontinuing amiodarone  and starting on lidocaine  infusion, if no further episodes of NSVT or frequent PVCs, look to transition into mexiletine 250 mcg twice daily -EP to see on Monday -Consider cardiac MRI -Pending EP recommendations on Monday she may transfer for ablation to [facility]   Hypotension with a past medical history of hypertension -With MAP dropping below 65 -blood pressure dropping to 88/52 on rounds 98/39 -PTA  antihypertensive medications currently on hold -On phenylephrine  infusion at 35 mics per minute -Continue to titrate pressors and wean to off from MAP greater than 65 -Vital signs per unit protocol  Cervical stenosis status post C4 corpectomy and C5-6, C6/7 ACDF/chronic pain -Surgery was completed approximately 10 days ago -Continues to complain of surgical site pain -On several pain medications likely playing a role in her hypotension -Ongoing management per PCCM  Type 2 diabetes -Continued on sliding scale coverage -Hemoglobin A1c 6.4  Anxiety depression/bipolar disorder -Continued on outpatient Cymbalta  - Continued on as needed Ativan  - Ongoing management per PCCM   Risk Assessment/Risk Scores:              For questions or updates, please contact [facility] Please consult [email or web address] for contact info under    Signed, Mayrene Bastarache, NP  01/07/2024 7:41 AM

## 2024-01-07 NOTE — Plan of Care (Signed)
  Problem: Education: Goal: Knowledge of General Education information will improve Description: Including pain rating scale, medication(s)/side effects and non-pharmacologic comfort measures Outcome: Progressing   Problem: Health Behavior/Discharge Planning: Goal: Ability to manage health-related needs will improve Outcome: Progressing   Problem: Clinical Measurements: Goal: Will remain free from infection Outcome: Progressing Goal: Diagnostic test results will improve Outcome: Progressing Goal: Cardiovascular complication will be avoided Outcome: Progressing   Problem: Activity: Goal: Risk for activity intolerance will decrease Outcome: Progressing   Problem: Nutrition: Goal: Adequate nutrition will be maintained Outcome: Progressing   Problem: Elimination: Goal: Will not experience complications related to bowel motility Outcome: Progressing Goal: Will not experience complications related to urinary retention Outcome: Progressing   Problem: Pain Managment: Goal: General experience of comfort will improve and/or be controlled Outcome: Progressing   Problem: Skin Integrity: Goal: Risk for impaired skin integrity will decrease Outcome: Progressing

## 2024-01-08 ENCOUNTER — Encounter: Payer: Self-pay | Admitting: Pulmonary Disease

## 2024-01-08 DIAGNOSIS — I959 Hypotension, unspecified: Secondary | ICD-10-CM

## 2024-01-08 DIAGNOSIS — I472 Ventricular tachycardia, unspecified: Secondary | ICD-10-CM | POA: Diagnosis not present

## 2024-01-08 DIAGNOSIS — I493 Ventricular premature depolarization: Secondary | ICD-10-CM

## 2024-01-08 DIAGNOSIS — E119 Type 2 diabetes mellitus without complications: Secondary | ICD-10-CM | POA: Diagnosis not present

## 2024-01-08 DIAGNOSIS — M4802 Spinal stenosis, cervical region: Secondary | ICD-10-CM | POA: Diagnosis not present

## 2024-01-08 LAB — MAGNESIUM: Magnesium: 1.9 mg/dL (ref 1.7–2.4)

## 2024-01-08 LAB — GLUCOSE, CAPILLARY
Glucose-Capillary: 166 mg/dL — ABNORMAL HIGH (ref 70–99)
Glucose-Capillary: 112 mg/dL — ABNORMAL HIGH (ref 70–99)
Glucose-Capillary: 101 mg/dL — ABNORMAL HIGH (ref 70–99)
Glucose-Capillary: 102 mg/dL — ABNORMAL HIGH (ref 70–99)
Glucose-Capillary: 131 mg/dL — ABNORMAL HIGH (ref 70–99)
Glucose-Capillary: 112 mg/dL — ABNORMAL HIGH (ref 70–99)
Glucose-Capillary: 110 mg/dL — ABNORMAL HIGH (ref 70–99)

## 2024-01-08 LAB — RENAL FUNCTION PANEL
Albumin: 2.9 g/dL — ABNORMAL LOW (ref 3.5–5.0)
Anion gap: 7 (ref 5–15)
BUN: 13 mg/dL (ref 6–20)
CO2: 26 mmol/L (ref 22–32)
Calcium: 8.2 mg/dL — ABNORMAL LOW (ref 8.9–10.3)
Chloride: 103 mmol/L (ref 98–111)
Creatinine, Ser: 0.74 mg/dL (ref 0.44–1.00)
GFR, Estimated: >60 mL/min (ref >60)
Glucose, Bld: 98 mg/dL (ref 70–99)
Phosphorus: 3.2 mg/dL (ref 2.5–4.6)
Potassium: 4.1 mmol/L (ref 3.5–5.1)
Sodium: 136 mmol/L (ref 135–145)

## 2024-01-08 MED ORDER — BUPRENORPHINE HCL 2 MG SL SUBL
8.0000 mg | SUBLINGUAL_TABLET | Freq: Three times a day (TID) | SUBLINGUAL | Status: DC
  Administered 2024-01-08 – 2024-01-10 (×6): 8 mg via SUBLINGUAL
  Filled 2024-01-08 (×3): qty 1
  Filled 2024-01-08 (×2): qty 4
  Filled 2024-01-08: qty 1

## 2024-01-08 MED ORDER — INSULIN ASPART 100 UNIT/ML IJ SOLN
0.0000 [IU] | Freq: Three times a day (TID) | INTRAMUSCULAR | Status: DC
  Administered 2024-01-09: 2 [IU] via SUBCUTANEOUS
  Administered 2024-01-10: 3 [IU] via SUBCUTANEOUS
  Filled 2024-01-08 (×2): qty 1

## 2024-01-08 MED ORDER — MEXILETINE HCL 150 MG PO CAPS
150.0000 mg | ORAL_CAPSULE | Freq: Three times a day (TID) | ORAL | Status: DC
  Administered 2024-01-08 – 2024-01-10 (×6): 150 mg via ORAL
  Filled 2024-01-08 (×7): qty 1

## 2024-01-08 MED ORDER — ENSURE PLUS HIGH PROTEIN PO LIQD
237.0000 mL | Freq: Two times a day (BID) | ORAL | Status: DC
  Administered 2024-01-09 (×2): 237 mL via ORAL

## 2024-01-08 MED ORDER — INSULIN ASPART 100 UNIT/ML IJ SOLN: 0.0000 [IU] | Freq: Three times a day (TID) | INTRAMUSCULAR | Status: DC

## 2024-01-08 MED ORDER — MAGNESIUM SULFATE IN D5W 1-5 GM/100ML-% IV SOLN
1.0000 g | Freq: Once | INTRAVENOUS | Status: AC
  Administered 2024-01-08: 1 g via INTRAVENOUS
  Filled 2024-01-08: qty 100

## 2024-01-08 MED ORDER — BUPROPION HCL ER (XL) 150 MG PO TB24
150.0000 mg | ORAL_TABLET | Freq: Every day | ORAL | Status: DC
  Administered 2024-01-08 – 2024-01-10 (×3): 150 mg via ORAL
  Filled 2024-01-08 (×3): qty 1

## 2024-01-08 NOTE — Plan of Care (Signed)
 Continuing with plan of care.

## 2024-01-08 NOTE — Plan of Care (Signed)
  Problem: Education: Goal: Knowledge of General Education information will improve Description: Including pain rating scale, medication(s)/side effects and non-pharmacologic comfort measures Outcome: Progressing   Problem: Health Behavior/Discharge Planning: Goal: Ability to manage health-related needs will improve Outcome: Progressing   Problem: Clinical Measurements: Goal: Respiratory complications will improve Outcome: Progressing   Problem: Activity: Goal: Risk for activity intolerance will decrease Outcome: Progressing   Problem: Nutrition: Goal: Adequate nutrition will be maintained Outcome: Progressing   Problem: Elimination: Goal: Will not experience complications related to urinary retention Outcome: Progressing   Problem: Nutritional: Goal: Maintenance of adequate nutrition will improve Outcome: Progressing   Problem: Pain Managment: Goal: General experience of comfort will improve and/or be controlled Outcome: Not Progressing

## 2024-01-08 NOTE — Plan of Care (Signed)
  Problem: Education: Goal: Knowledge of General Education information will improve Description: Including pain rating scale, medication(s)/side effects and non-pharmacologic comfort measures Outcome: Progressing   Problem: Clinical Measurements: Goal: Ability to maintain clinical measurements within normal limits will improve Outcome: Progressing Goal: Will remain free from infection Outcome: Progressing Goal: Diagnostic test results will improve Outcome: Progressing Goal: Respiratory complications will improve Outcome: Progressing   Problem: Activity: Goal: Risk for activity intolerance will decrease Outcome: Progressing   Problem: Nutrition: Goal: Adequate nutrition will be maintained Outcome: Progressing   Problem: Coping: Goal: Level of anxiety will decrease Outcome: Progressing   Problem: Elimination: Goal: Will not experience complications related to bowel motility Outcome: Progressing Goal: Will not experience complications related to urinary retention Outcome: Progressing   Problem: Pain Managment: Goal: General experience of comfort will improve and/or be controlled Outcome: Not Progressing Pain management discussed w/ provider - no new orders received.

## 2024-01-08 NOTE — Progress Notes (Signed)
 Rounding Note    Patient Name: Breanna Mathews Date of Encounter: 01/08/2024  [facility] Cardiologist: Timothy Gollan, MD   Subjective   Main concern is neck pain since her surgery. She is asking about when she can go home, was hoping to be discharged today. Discussed her VT, the IV lidocaine , plan for EP consult on Monday.  Inpatient Medications    Scheduled Meds:  amoxicillin   1,000 mg Oral Q12H   buprenorphine   8 mg Sublingual TID   buPROPion   150 mg Oral Daily   Chlorhexidine  Gluconate Cloth  6 each Topical Daily   DULoxetine   60 mg Oral Daily   enoxaparin  (LOVENOX ) injection  0.5 mg/kg Subcutaneous Q24H   insulin  aspart  0-15 Units Subcutaneous Q4H   methocarbamol   500 mg Oral Q6H   mexiletine  150 mg Oral TID   Continuous Infusions:  lidocaine  1 mg/min (01/08/24 0900)   phenylephrine  (NEO-SYNEPHRINE) Adult infusion 15 mcg/min (01/08/24 0900)   PRN Meds: buprenorphine -naloxone , docusate sodium , LORazepam , polyethylene glycol   Vital Signs    Vitals:   01/08/24 0451 01/08/24 0600 01/08/24 0700 01/08/24 0800  BP: 121/61 131/66 134/80 132/70  Pulse:   64 68  Resp:   14 11  Temp:      TempSrc:      SpO2:   96% 96%  Weight:      Height:        Intake/Output Summary (Last 24 hours) at 01/08/2024 1154 Last data filed at 01/08/2024 0900 Gross per 24 hour  Intake 2615.04 ml  Output --  Net 2615.04 ml      01/08/2024    4:45 AM 01/07/2024    1:24 AM 07/23/2022    9:17 AM  Last 3 Weights  Weight (lbs) 223 lb 1.7 oz 210 lb 224 lb 12.8 oz  Weight (kg) 101.2 kg 95.255 kg 101.969 kg      Telemetry    SR with very rare PVC - Personally Reviewed  Physical Exam   GEN: No acute distress.   Neck: No JVD Cardiac: RRR, no murmurs, rubs, or gallops.  Respiratory: Clear to auscultation bilaterally. GI: Soft, nontender, non-distended  MS: No edema; No deformity. Neuro:  Nonfocal  Psych: Normal affect   New pertinent results (labs, ECG, imaging, cardiac  studies)    Echo 01/07/24 personally reviewed: normal EF, no significant valve disease Assessment & Plan    Ventricular tachycardia Frequent PVCs -acute symptoms of palpitations and chest discomfort 7/3, but per report had been happening more intermittently several days prior to this -presenting ECG with ventricular bigeminy, Qtc normal -echo unremarkable, hsTn normal (3, 4) -procalcitonin, lactate normal -keep K >4, Mg >2 -EP will formally see 7/7, but per Dr. Tresia note 7/4, on discussion with them recommended stopping amiodarone  and continuing lidocaine  drip. I reached out to EP on call at [facility]. After discussion, recommendation is to start mexelitine 150 mg TID, stop lidocaine  drip with start of oral. Recommended to monitor in ICU for first 24 hours in case IV lidocaine  needs to be restarted. Just came off phenylephrine  for BP, will add beta blocker once BP remains stable. Keep admitted until 7/7 for formal EP consult  Hypotension -unclear etiology; phenylephrine  recently stopped  Recent spinal surgery Anxiety, depression, bipolar disorder Type II diabetes -per primary team  CRITICAL CARE Critical care was necessary to treat or prevent imminent or life-threatening deterioration. Treatment requires high complexity decision making. Total critical care time: 45 minutes.   This time includes  extensive review of vitals, labs, and testing, evaluation of patient's response to treatment, examination of patient, discussion with family, and discussing with team members including nursing and other physician consultants. Critical care time was exclusive of separately billable procedures and treating other patients.       Signed, Shelda Bruckner, MD  01/08/2024, 11:54 AM

## 2024-01-08 NOTE — Consult Note (Signed)
 PHARMACY CONSULT NOTE - FOLLOW UP  Pharmacy Consult for Electrolyte Monitoring and Replacement   Recent Labs: Potassium (mmol/L)  Date Value  01/08/2024 4.1   Magnesium  (mg/dL)  Date Value  92/94/7974 1.9   Calcium  (mg/dL)  Date Value  92/94/7974 8.2 (L)   Albumin (g/dL)  Date Value  92/94/7974 2.9 (L)   Phosphorus (mg/dL)  Date Value  92/94/7974 3.2   Sodium (mmol/L)  Date Value  01/08/2024 136     Assessment: 38 yo F presenting to [facility] ED from home via EMS for evaluation of chest pain. She has had some re-occurring RLE edema. Pt had persistent ventricular tachycardia. Pharmacy is asked to follow and replace electrolytes while in CCU  Goal of Therapy:  Potassium 4.0 - 5.1 mmol/L Magnesium  2.0 - 2.4 mg/dL All Other Electrolytes WNL   Plan:  ---1 gram IV magnesium  sulfate x 1 ---F/u with AM labs.    Breanna Mathews ,PharmD Clinical Pharmacist 01/08/2024 7:06 AM

## 2024-01-08 NOTE — Progress Notes (Signed)
 NAME:  Summit Borchardt, MRN:  [id number], DOB:  July 24, 1985, LOS: 1 ADMISSION DATE:  01/07/2024  History of Present Illness:  38 yo F presenting to [facility] ED from home via EMS for evaluation of chest pain.   History provided per chart review and patient bedside report. Patient describes being in her normal state of health until a few days ago when her heart started to feel weird. She describes this as intermittent 6/10 chest pain that aches with pressure on the right-medial side of her chest. She also describes palpitations with correlating vision changes where she sees black spots. No rhyme or reason to these events that she has noticed. At her post-op appt last week, someone noted that her HR was bouncing around between 20 and 120, but her BP was stable. Around 22:00 on 7/3 she describes persistent chest pain 6/10 unable to get comfortable, pressure/ squeezing internally, constant ache. She does endorse a headache and some dyspnea earlier when laying on her back- but otherwise no shortness of breath. Only other pain is at her surgical site, site is clean dry and intact and she has not noticed any drainage or redness. She does have recent RLE edema and redness with discomfort- unclear because initially reported pain, then denied pain in leg. She has had some re-occurring RLE edema which she is on lasix for. She denies falls, LOC, cough, fever/chills, abdominal pain/ nausea/ vomiting/ diarrhea or dizziness. She has been taking all her medication as prescribed including a significant chronic pain medication regimen.    ED course: Upon arrival patient alert and responsive with self-sustained runs of Vtach, transitioning in and out of bigeminy/ trigeminy with underlying NSR. Amiodarone  drip started. Las largely unremarkable with mild leukocytosis. Cardiology consulted due to difficulty controlling Vtach and concern for increased frequency since arrival. Recommendation for lidocaine  bolus and drip in addition  to amiodarone  drip.  Medications given: amiodarone  drip & 1 L IVF bolus Initial Vitals: 98.2, 16, 50, 100/49 & 94% on RA Significant labs: (Labs/ Imaging personally reviewed) I, Jenita Ruth Rust-Chester, AGACNP-BC, personally viewed and interpreted this ECG. EKG Interpretation: Date: 01/07/24, EKG Time: 01:19, Rate: 108, Rhythm: ST, QRS Axis:  normal, Intervals: borderline prolonged PR, ST/T Wave abnormalities: none, Narrative Interpretation: ST with bigeminy Chemistry: Na+: 136, K+: 3.7, BUN/Cr.: 18/ 0.88, Serum CO2/ AG: 27/ 11, Mg: 2.1 Hematology: WBC: 11.1, Hgb: 13.3,  Troponin: 3, BNP: 8, Lactic/ PCT: 1.8/ <0.10,    CXR 01/07/24: no active disease BLE Venous US  01/07/24: negative for DVT   PCCM consulted for admission due to persistent Ventricular Tachycardia on amiodarone  drip.    Pertinent  Medical History  Cervical Stenosis s/p C4 corpectomy & C5-6, C6-7 ACDF (12/2023) Bipolar 1 disorder Anxiety & depression Chronic Migraine Seizure disorder (not on AED- remote history) HTN Lupus T2DM insulin  dependent Every day smoker (30 pack history) Chronic pain    Significant Hospital Events: Including procedures, antibiotic start and stop dates in addition to other pertinent events   01/07/24: Admit to ICU with persistent Ventricular Tachycardia on amiodarone . Switched to Lidocaine  drip.   Interim History / Subjective:  Patient feels fine today. Has no major complaint.  Remains on Lidocaine  drip at 1mg /min and Neo at 25 (MAP 100). Minimal PVCs on the monitor.   Objective    Blood pressure 131/66, pulse 88, temperature 98.2 F (36.8 C), temperature source Oral, resp. rate (!) 23, height 5' 8 (1.727 m), weight 101.2 kg, SpO2 98%, unknown if currently breastfeeding.  Intake/Output Summary (Last 24 hours) at 01/08/2024 0837 Last data filed at 01/08/2024 0001 Gross per 24 hour  Intake 1996.62 ml  Output --  Net 1996.62 ml   Filed Weights   01/07/24 0124 01/08/24 0445  Weight:  95.3 kg 101.2 kg    Examination: GEN Coherent and cooperative. No acute distress.  HEENT supple neck, reactive pupils, EOMI CVS normal S1, normal S2, tachycardia at 100 with multiple PVCs noted Lungs clear bilateral air entry Abdomen soft nontender nondistended positive bowel sounds Extremities right lower extremity erythema and swelling nontender   Labs and imaging were reviewed  Assessment and Plan  # Shock likely cardiogenic versus distributive in the setting of... # Sustained VT now on lidocaine  drip unclear reason possible polypharmacy, sequela of spinal surgery.  EKG with prolonged PR interval, normal QTc and normal QRS intervals. #RLE cellulitis #Type II DM Insulin  dependent  #Chronic pain on Subutex   #Hypertension    Neuro: c/w home buprenorphine .  Suboxone  as needed.  C/w duloxetine  for anxiety.  Continue home Robaxin  for muscle spasm.  Resume home Wellbutrin . CVS: Neo for MAP > 65. Wean down as tolerated. C/w Lidocaine  at 1mg /min. Pending EP eval.  GI: Ok for diet advance as tolerated.  Renal: Moniotr UOP. Avoid nephrotox agents. = ID: Amoxicillin  1g Q12H for 7 days.  Heme: Lovenox  for DVT prophylaxis  Endo: POC goal 140-180    Best Practice (right click and Reselect all SmartList Selections daily)   Diet/type: Regular consistency (see orders) DVT prophylaxis LMWH Pressure ulcer(s): N/A GI prophylaxis: N/A Lines: N/A Foley:  N/A Code Status:  full code  Last date of multidisciplinary goals of care discussion [01/08/2024]  Critical care time: 38 minutes    Darrin Barn, MD [place] 01/08/2024 8:51 AM

## 2024-01-09 DIAGNOSIS — I472 Ventricular tachycardia, unspecified: Secondary | ICD-10-CM | POA: Diagnosis not present

## 2024-01-09 DIAGNOSIS — M4802 Spinal stenosis, cervical region: Secondary | ICD-10-CM | POA: Diagnosis not present

## 2024-01-09 DIAGNOSIS — E119 Type 2 diabetes mellitus without complications: Secondary | ICD-10-CM | POA: Diagnosis not present

## 2024-01-09 LAB — CBC
HCT: 32.1 % — ABNORMAL LOW (ref 36.0–46.0)
Hemoglobin: 10.8 g/dL — ABNORMAL LOW (ref 12.0–15.0)
MCH: 29.9 pg (ref 26.0–34.0)
MCHC: 33.6 g/dL (ref 30.0–36.0)
MCV: 88.9 fL (ref 80.0–100.0)
Platelets: 240 K/uL (ref 150–400)
RBC: 3.61 MIL/uL — ABNORMAL LOW (ref 3.87–5.11)
RDW: 12.2 % (ref 11.5–15.5)
WBC: 5.7 K/uL (ref 4.0–10.5)
nRBC: 0 % (ref 0.0–0.2)

## 2024-01-09 LAB — GLUCOSE, CAPILLARY
Glucose-Capillary: 116 mg/dL — ABNORMAL HIGH (ref 70–99)
Glucose-Capillary: 107 mg/dL — ABNORMAL HIGH (ref 70–99)
Glucose-Capillary: 114 mg/dL — ABNORMAL HIGH (ref 70–99)
Glucose-Capillary: 125 mg/dL — ABNORMAL HIGH (ref 70–99)

## 2024-01-09 LAB — RENAL FUNCTION PANEL
Albumin: 3.1 g/dL — ABNORMAL LOW (ref 3.5–5.0)
Anion gap: 7 (ref 5–15)
BUN: 10 mg/dL (ref 6–20)
CO2: 26 mmol/L (ref 22–32)
Calcium: 8.7 mg/dL — ABNORMAL LOW (ref 8.9–10.3)
Chloride: 105 mmol/L (ref 98–111)
Creatinine, Ser: 0.71 mg/dL (ref 0.44–1.00)
GFR, Estimated: >60 mL/min (ref >60)
Glucose, Bld: 122 mg/dL — ABNORMAL HIGH (ref 70–99)
Phosphorus: 3.5 mg/dL (ref 2.5–4.6)
Potassium: 4.3 mmol/L (ref 3.5–5.1)
Sodium: 138 mmol/L (ref 135–145)

## 2024-01-09 LAB — MAGNESIUM: Magnesium: 1.9 mg/dL (ref 1.7–2.4)

## 2024-01-09 MED ORDER — NICOTINE 21 MG/24HR TD PT24
21.0000 mg | MEDICATED_PATCH | Freq: Every day | TRANSDERMAL | Status: DC
  Administered 2024-01-09 – 2024-01-10 (×2): 21 mg via TRANSDERMAL
  Filled 2024-01-09 (×2): qty 1

## 2024-01-09 MED ORDER — MAGNESIUM SULFATE IN D5W 1-5 GM/100ML-% IV SOLN
1.0000 g | Freq: Once | INTRAVENOUS | Status: AC
  Administered 2024-01-09: 1 g via INTRAVENOUS
  Filled 2024-01-09: qty 100

## 2024-01-09 NOTE — Progress Notes (Signed)
 NAME:  Breanna Mathews, MRN:  [id number], DOB:  [birth year], LOS: 2 ADMISSION DATE:  01/07/2024  History of Present Illness:  38 yo F presenting to [facility] ED from home via EMS for evaluation of chest pain.   History provided per chart review and patient bedside report. Patient describes being in her normal state of health until a few days ago when her heart started to feel weird. She describes this as intermittent 6/10 chest pain that aches with pressure on the right-medial side of her chest. She also describes palpitations with correlating vision changes where she sees black spots. No rhyme or reason to these events that she has noticed. At her post-op appt last week, someone noted that her HR was bouncing around between 20 and 120, but her BP was stable. Around 22:00 on 7/3 she describes persistent chest pain 6/10 unable to get comfortable, pressure/ squeezing internally, constant ache. She does endorse a headache and some dyspnea earlier when laying on her back- but otherwise no shortness of breath. Only other pain is at her surgical site, site is clean dry and intact and she has not noticed any drainage or redness. She does have recent RLE edema and redness with discomfort- unclear because initially reported pain, then denied pain in leg. She has had some re-occurring RLE edema which she is on lasix for. She denies falls, LOC, cough, fever/chills, abdominal pain/ nausea/ vomiting/ diarrhea or dizziness. She has been taking all her medication as prescribed including a significant chronic pain medication regimen.    ED course: Upon arrival patient alert and responsive with self-sustained runs of Vtach, transitioning in and out of bigeminy/ trigeminy with underlying NSR. Amiodarone  drip started. Las largely unremarkable with mild leukocytosis. Cardiology consulted due to difficulty controlling Vtach and concern for increased frequency since arrival. Recommendation for lidocaine  bolus and drip in addition  to amiodarone  drip.  Medications given: amiodarone  drip & 1 L IVF bolus Initial Vitals: 98.2, 16, 50, 100/49 & 94% on RA Significant labs: (Labs/ Imaging personally reviewed) I, Jenita Ruth Rust-Chester, AGACNP-BC, personally viewed and interpreted this ECG. EKG Interpretation: Date: 01/07/24, EKG Time: 01:19, Rate: 108, Rhythm: ST, QRS Axis:  normal, Intervals: borderline prolonged PR, ST/T Wave abnormalities: none, Narrative Interpretation: ST with bigeminy Chemistry: Na+: 136, K+: 3.7, BUN/Cr.: 18/ 0.88, Serum CO2/ AG: 27/ 11, Mg: 2.1 Hematology: WBC: 11.1, Hgb: 13.3,  Troponin: 3, BNP: 8, Lactic/ PCT: 1.8/ <0.10,    CXR 01/07/24: no active disease BLE Venous US  01/07/24: negative for DVT   PCCM consulted for admission due to persistent Ventricular Tachycardia on amiodarone  drip.    Pertinent  Medical History  Cervical Stenosis s/p C4 corpectomy & C5-6, C6-7 ACDF (12/2023) Bipolar 1 disorder Anxiety & depression Chronic Migraine Seizure disorder (not on AED- remote history) HTN Lupus T2DM insulin  dependent Every day smoker (30 pack history) Chronic pain    Significant Hospital Events: Including procedures, antibiotic start and stop dates in addition to other pertinent events   01/07/24: Admit to ICU with persistent Ventricular Tachycardia on amiodarone . Switched to Lidocaine  drip.  01/08/2024: Patient transitioned to Mexiletine, remained stable.  Interim History / Subjective:  No major events overnight.  Transitioned to Mexiletine yesterday and rhythm remained stable.  No major complaints this am.   Objective    Blood pressure 108/65, pulse 77, temperature 97.8 F (36.6 C), temperature source Oral, resp. rate 12, height 5' 8 (1.727 m), weight 96.5 kg, SpO2 94%, unknown if currently breastfeeding.  Intake/Output Summary (Last 24 hours) at 01/09/2024 0747 Last data filed at 01/09/2024 0558 Gross per 24 hour  Intake 1724.92 ml  Output 3325 ml  Net -1600.08 ml   Filed  Weights   01/07/24 0124 01/08/24 0445 01/09/24 0405  Weight: 95.3 kg 101.2 kg 96.5 kg    Examination: GEN Coherent and cooperative. No acute distress.  HEENT supple neck, reactive pupils, EOMI CVS normal S1, normal S2, tachycardia at 100 with multiple PVCs noted Lungs clear bilateral air entry Abdomen soft nontender nondistended positive bowel sounds Extremities right lower extremity erythema and swelling nontender   Labs and imaging were reviewed  Assessment and Plan  # Shock likely cardiogenic versus distributive in the setting of... # Sustained VT now on lidocaine  drip unclear reason possible polypharmacy, sequela of spinal surgery.  EKG with prolonged PR interval, normal QTc and normal QRS intervals. #RLE cellulitis #Type II DM Insulin  dependent  #Chronic pain on Subutex   #Hypertension    Neuro: c/w home buprenorphine .  Suboxone  as needed.  C/w duloxetine  for anxiety.  Continue home Robaxin  for muscle spasm.  Resume home Wellbutrin . CVS: Off Neo. MAP > 65. Off lidocaine  drip. C/w Mexiletine 150mg  TID. Pending EP eval on Monday.  GI: Ok for diet advance as tolerated.  Renal: Moniotr UOP. Avoid nephrotox agents. = ID: Amoxicillin  1g Q12H for 5 days.  Heme: Lovenox  for DVT prophylaxis  Endo: POC goal 140-180    Best Practice (right click and Reselect all SmartList Selections daily)   Diet/type: Regular consistency (see orders) DVT prophylaxis LMWH Pressure ulcer(s): N/A GI prophylaxis: N/A Lines: N/A Foley:  N/A Code Status:  full code  Last date of multidisciplinary goals of care discussion [01/08/2024]  Critical care time: 35 minutes    Darrin Barn, MD [place] [facility] 01/09/2024 7:47 AM

## 2024-01-09 NOTE — Plan of Care (Signed)
 Continuing with plan of care.

## 2024-01-09 NOTE — Progress Notes (Signed)
 Report given to receiving nurse for 260.

## 2024-01-09 NOTE — Consult Note (Signed)
 PHARMACY CONSULT NOTE - FOLLOW UP  Pharmacy Consult for Electrolyte Monitoring and Replacement   Recent Labs: Potassium (mmol/L)  Date Value  01/09/2024 4.3   Magnesium  (mg/dL)  Date Value  92/93/7974 1.9   Calcium  (mg/dL)  Date Value  92/93/7974 8.7 (L)   Albumin (g/dL)  Date Value  92/93/7974 3.1 (L)   Phosphorus (mg/dL)  Date Value  92/93/7974 3.5   Sodium (mmol/L)  Date Value  01/09/2024 138     Assessment: 38 yo F presenting to [facility] ED from home via EMS for evaluation of chest pain. She has had some re-occurring RLE edema. Pt had persistent ventricular tachycardia. Pharmacy is asked to follow and replace electrolytes while in CCU  Goal of Therapy:  Potassium 4.0 - 5.1 mmol/L Magnesium  2.0 - 2.4 mg/dL All Other Electrolytes WNL   Plan:  ---1 gram IV magnesium  sulfate x 1 ---F/u with AM labs.    Adriana JONETTA Bolster ,PharmD Clinical Pharmacist 01/09/2024 7:09 AM

## 2024-01-09 NOTE — Progress Notes (Signed)
 Patient transferred to room 260 on cardiac monitoring and with all belongings in stable condition.

## 2024-01-09 NOTE — Progress Notes (Signed)
 Rounding Note    Patient Name: Breanna Mathews Date of Encounter: 01/09/2024  [facility] Cardiologist: Timothy Gollan, MD   Subjective   Transitioned to oral mexelitine yesterday, rhythm has remained stable. Has been off of phenylephrine  but BP still borderline. Discussed recommendations to remain in hospital until EP can see tomorrow. She is amenable. Asking if when she goes home someone can fill the ativan  at the 1 mg dose until she can see her primary provider, as this has helped her symptoms.  Inpatient Medications    Scheduled Meds:  amoxicillin   1,000 mg Oral Q12H   buprenorphine   8 mg Sublingual TID   buPROPion   150 mg Oral Daily   Chlorhexidine  Gluconate Cloth  6 each Topical Daily   DULoxetine   60 mg Oral Daily   enoxaparin  (LOVENOX ) injection  0.5 mg/kg Subcutaneous Q24H   feeding supplement  237 mL Oral BID BM   insulin  aspart  0-15 Units Subcutaneous TID AC & HS   methocarbamol   500 mg Oral Q6H   mexiletine  150 mg Oral TID   Continuous Infusions:   PRN Meds: buprenorphine -naloxone , docusate sodium , LORazepam , polyethylene glycol   Vital Signs    Vitals:   01/09/24 0850 01/09/24 0900 01/09/24 1000 01/09/24 1100  BP:  118/76 112/63 122/78  Pulse: 73 89 85 69  Resp: 13 19 15 13   Temp:      TempSrc:      SpO2: 96% 95% 96% 94%  Weight:      Height:        Intake/Output Summary (Last 24 hours) at 01/09/2024 1158 Last data filed at 01/09/2024 0558 Gross per 24 hour  Intake 1641.72 ml  Output 3325 ml  Net -1683.28 ml      01/09/2024    4:05 AM 01/08/2024    4:45 AM 01/07/2024    1:24 AM  Last 3 Weights  Weight (lbs) 212 lb 11.9 oz 223 lb 1.7 oz 210 lb  Weight (kg) 96.5 kg 101.2 kg 95.255 kg      Telemetry    SR with very rare PVC - Personally Reviewed  Physical Exam   GEN: Well nourished, well developed in no acute distress NECK: No JVD CARDIAC: regular rhythm, normal S1 and S2, no rubs or gallops. No murmur. VASCULAR: Radial pulses 2+  bilaterally.  RESPIRATORY:  Clear to auscultation without rales, wheezing or rhonchi  ABDOMEN: Soft, non-tender, non-distended MUSCULOSKELETAL:  Moves all 4 limbs independently SKIN: Warm and dry, no edema NEUROLOGIC:  No focal neuro deficits noted. PSYCHIATRIC:  Normal affect     New pertinent results (labs, ECG, imaging, cardiac studies)    Echo 01/07/24 personally reviewed: normal EF, no significant valve disease Assessment & Plan    Ventricular tachycardia Frequent PVCs -acute symptoms of palpitations and chest discomfort 7/3, but per report had been happening more intermittently several days prior to this -presenting ECG with ventricular bigeminy, Qtc normal -echo unremarkable, hsTn normal (3, 4) -procalcitonin, lactate normal -keep K >4, Mg >2 -EP will formally see 7/7, but per Dr. Tresia note 7/4, on discussion with them recommended stopping amiodarone  and continuing lidocaine  drip. I discussed with EP on 7/5, started mexelitine 150 mg TID. No BP room for beta blocker at this time but add when able. Keep admitted until 7/7 for formal EP consult.   Hypotension -unclear etiology; phenylephrine  stopped 7/5.  Recent spinal surgery Anxiety, depression, bipolar disorder Type II diabetes -per primary team  Ok to move to floor from  cardiac perspective.    Signed, Shelda Bruckner, MD  01/09/2024, 11:58 AM

## 2024-01-10 ENCOUNTER — Other Ambulatory Visit: Payer: Self-pay

## 2024-01-10 LAB — BASIC METABOLIC PANEL WITH GFR
Anion gap: 9 (ref 5–15)
BUN: 9 mg/dL (ref 6–20)
CO2: 25 mmol/L (ref 22–32)
Calcium: 9.2 mg/dL (ref 8.9–10.3)
Chloride: 104 mmol/L (ref 98–111)
Creatinine, Ser: 0.79 mg/dL (ref 0.44–1.00)
GFR, Estimated: >60 mL/min (ref >60)
Glucose, Bld: 83 mg/dL (ref 70–99)
Potassium: 4.8 mmol/L (ref 3.5–5.1)
Sodium: 138 mmol/L (ref 135–145)

## 2024-01-10 LAB — GLUCOSE, CAPILLARY
Glucose-Capillary: 190 mg/dL — ABNORMAL HIGH (ref 70–99)
Glucose-Capillary: 103 mg/dL — ABNORMAL HIGH (ref 70–99)

## 2024-01-10 LAB — CBC
HCT: 35.7 % — ABNORMAL LOW (ref 36.0–46.0)
Hemoglobin: 11.5 g/dL — ABNORMAL LOW (ref 12.0–15.0)
MCH: 29.2 pg (ref 26.0–34.0)
MCHC: 32.2 g/dL (ref 30.0–36.0)
MCV: 90.6 fL (ref 80.0–100.0)
Platelets: 304 K/uL (ref 150–400)
RBC: 3.94 MIL/uL (ref 3.87–5.11)
RDW: 12.3 % (ref 11.5–15.5)
WBC: 7.6 K/uL (ref 4.0–10.5)
nRBC: 0 % (ref 0.0–0.2)

## 2024-01-10 LAB — MAGNESIUM: Magnesium: 2.2 mg/dL (ref 1.7–2.4)

## 2024-01-10 MED ORDER — METHOCARBAMOL 500 MG PO TABS: 500.0000 mg | ORAL_TABLET | Freq: Four times a day (QID) | ORAL | Status: DC | PRN

## 2024-01-10 MED ORDER — MEXILETINE HCL 150 MG PO CAPS
150.0000 mg | ORAL_CAPSULE | Freq: Three times a day (TID) | ORAL | 0 refills | Status: DC
Start: 2024-01-10 — End: 2024-01-10
  Filled 2024-01-10: qty 90, 30d supply, fill #0

## 2024-01-10 MED ORDER — BUPROPION HCL ER (XL) 150 MG PO TB24
150.0000 mg | ORAL_TABLET | Freq: Every day | ORAL | 0 refills | Status: AC
Start: 2024-01-11 — End: ?
  Filled 2024-01-10: qty 20, 20d supply, fill #0

## 2024-01-10 MED ORDER — DULOXETINE HCL 60 MG PO CPEP
60.0000 mg | ORAL_CAPSULE | Freq: Every day | ORAL | 0 refills | Status: DC
  Filled 2024-01-10: qty 20, 20d supply, fill #0

## 2024-01-10 MED ORDER — LORAZEPAM 1 MG PO TABS: 1.0000 mg | ORAL_TABLET | Freq: Two times a day (BID) | ORAL | Status: DC | PRN

## 2024-01-10 MED ORDER — MEXILETINE HCL 150 MG PO CAPS: 150.0000 mg | ORAL_CAPSULE | Freq: Three times a day (TID) | ORAL | 0 refills | Status: AC

## 2024-01-10 NOTE — Consult Note (Signed)
 Cardiology Consultation   Patient ID: Breanna Mathews MRN: [id number]; DOB: 04/15/1986  Admit date: 01/07/2024 Date of Consult: 01/10/2024  PCP:  Amil Sovereign, MD   [facility] Providers Cardiologist:  Evalene Lunger, MD        Patient Profile: Breanna Mathews is a 38 y.o. female with a hx of cervical stenosis, bipolar disorder, anxiety, depression, seizures, hypertension, lupus, diabetes, tobacco abuse who is being seen 01/10/2024 for the evaluation of ventricular tachycardia at the request of Dawna Bruckner.  History of Present Illness: Breanna Mathews presented to the hospital with chest pain.  Prior to the episode, she was doing well, but began having heart racing.  This heart racing was accompanied by chest pain which was 6 out of 10.  Pressure was in the right side of her chest.  She had palpitations correlating with visual changes and black spots.  She is postop day 10 from neck surgery.  On 01/06/2024, she noted a persistent chest discomfort.  She had headache and dyspnea while lying flat.  On presentation to the emergency room, she was found to be in and out of ventricular tachycardia.  She was started on amiodarone  and lidocaine .  She has since been switched to mexiletine.   Past Medical History:  Diagnosis Date   Angina of effort (HCC)    Anxiety    Bipolar disorder with depression (HCC)    Chronic back pain    GERD (gastroesophageal reflux disease)    Hypertension    Mitral valve prolapse     Past Surgical History:  Procedure Laterality Date   APPENDECTOMY     BACK SURGERY     CESAREAN SECTION     KNEE ARTHROPLASTY Left    x 2   POLYPECTOMY     TONSILLECTOMY       Home Medications:  Prior to Admission medications   Medication Sig Start Date End Date Taking? Authorizing Provider  buprenorphine  (SUBUTEX ) 8 MG SUBL SL tablet Place 8 mg under the tongue 2 (two) times daily. 11/02/16   [provider]  Buprenorphine  HCl-Naloxone  HCl 8-2 MG  FILM Place 1 Film under the tongue daily. Takes at lunch 05/10/21   [provider]  carisoprodol  (SOMA ) 350 MG tablet Take 1 tablet by mouth 3 (three) times daily. 04/21/21   [provider]  diphenhydrAMINE  (BENADRYL ) 50 MG capsule Take 50 mg by mouth 3 (three) times daily.    [provider]  DULoxetine  (CYMBALTA ) 60 MG capsule Take 60 mg by mouth daily. 12/13/12   [provider]  LEVEMIR FLEXPEN 100 UNIT/ML FlexPen Inject into the skin. 05/30/22 05/30/23  [provider]  LORazepam  (ATIVAN ) 0.5 MG tablet Take 0.5 mg by mouth every 6 (six) hours as needed.    [provider]  metFORMIN (GLUCOPHAGE-XR) 500 MG 24 hr tablet Take 500 mg by mouth 2 (two) times daily. 04/29/21   [provider]  nicotine  (NICODERM CQ  - DOSED IN MG/24 HOURS) 21 mg/24hr patch Place 1 patch (21 mg total) onto the skin daily. 05/12/21   Jhonny Calvin NOVAK, MD  olmesartan -hydrochlorothiazide  (BENICAR  HCT) 40-25 MG tablet Take 1 tablet by mouth daily.    [provider]  sucralfate (CARAFATE) 1 g tablet Take 1 g by mouth 4 (four) times daily.    [provider]  SUMAtriptan (IMITREX) 100 MG tablet Take 100 mg by mouth once. 03/19/22 09/15/22  [provider]  topiramate (TOPAMAX) 25 MG tablet Take 25 mg by mouth daily. 03/19/22  [provider]    Scheduled Meds:  amoxicillin   1,000 mg Oral Q12H   buprenorphine   8 mg Sublingual TID   buPROPion   150 mg Oral Daily   Chlorhexidine  Gluconate Cloth  6 each Topical Daily   DULoxetine   60 mg Oral Daily   enoxaparin  (LOVENOX ) injection  0.5 mg/kg Subcutaneous Q24H   feeding supplement  237 mL Oral BID BM   insulin  aspart  0-15 Units Subcutaneous TID AC & HS   mexiletine  150 mg Oral TID   nicotine   21 mg Transdermal Daily   Continuous Infusions:  PRN Meds: buprenorphine -naloxone , docusate sodium , LORazepam , methocarbamol , polyethylene glycol  Allergies:    Allergies   Allergen Reactions   Ondansetron  Other (See Comments)    Prolonged QT syndrome - must monitor ECG if patient is taking this medication   Buspirone Other (See Comments)   Gabapentin  Other (See Comments)    Pt states it makes seizures worse per pt   Keflex [Cephalexin] Rash   Lithium Palpitations    Seizures    Opana [Oxymorphone Hcl] Palpitations   Oxymorphone Other (See Comments)    Social History:   Social History   Socioeconomic History   Marital status: Married    Spouse name: Not on file   Number of children: Not on file   Years of education: Not on file   Highest education level: Not on file  Occupational History   Not on file  Tobacco Use   Smoking status: Every Day    Current packs/day: 0.50    Types: Cigarettes   Smokeless tobacco: Never   Tobacco comments:    2 cigarettes/day  Vaping Use   Vaping status: Never Used  Substance and Sexual Activity   Alcohol use: No   Drug use: No   Sexual activity: Yes  Other Topics Concern   Not on file  Social History Narrative   Not on file   Social Drivers of Health   Financial Resource Strain: Low Risk  (12/28/2023)   Received [facility]   Overall Financial Resource Strain (CARDIA)    Difficulty of Paying Living Expenses: Not hard at all  Food Insecurity: No Food Insecurity (01/08/2024)   Hunger Vital Sign    Worried About Running Out of Food in the Last Year: Never true    Ran Out of Food in the Last Year: Never true  Transportation Needs: No Transportation Needs (01/08/2024)   PRAPARE - Administrator, Civil Service (Medical): No    Lack of Transportation (Non-Medical): No  Physical Activity: Insufficiently Active (08/27/2023)   Received from [facility]   Exercise Vital Sign    On average, how many days per week do you engage in moderate to strenuous exercise (like a brisk walk)?: 3 days    On average, how many minutes do you engage in exercise at this level?:  30 min  Stress: Stress Concern Present (08/27/2023)   Received from [facility] - Occupational Stress Questionnaire    Feeling of Stress : Rather much  Social Connections: Moderately Isolated (08/27/2023)   Received from [facility]   Social Connection and Isolation Panel    In a typical week, how many times do you talk on the phone with family, friends, or neighbors?: More than three times a week    How often do you get together with friends or relatives?: Never  How often do you attend church or religious services?: Never    Do you belong to any clubs or organizations such as church groups, unions, fraternal or athletic groups, or school groups?: No    How often do you attend meetings of the clubs or organizations you belong to?: Never    Are you married, widowed, divorced, separated, never married, or living with a partner?: Married  Intimate Partner Violence: Not At Risk (01/08/2024)   Humiliation, Afraid, Rape, and Kick questionnaire    Fear of Current or Ex-Partner: No    Emotionally Abused: No    Physically Abused: No    Sexually Abused: No    Family History:   History reviewed. No pertinent family history.   ROS:  Please see the history of present illness.   All other ROS reviewed and negative.     Physical Exam/Data: Vitals:   01/10/24 0037 01/10/24 0354 01/10/24 0516 01/10/24 0927  BP: 126/84  126/74 (!) 114/57  Pulse: 77  80 84  Resp: 18  18   Temp: 97.7 F (36.5 C)  98 F (36.7 C) (!) 97.4 F (36.3 C)  TempSrc:      SpO2: 97%  99% 99%  Weight:  112.5 kg    Height:       No intake or output data in the 24 hours ending 01/10/24 1103    01/10/2024    3:54 AM 01/09/2024    4:05 AM 01/08/2024    4:45 AM  Last 3 Weights  Weight (lbs) 248 lb 0.3 oz 212 lb 11.9 oz 223 lb 1.7 oz  Weight (kg) 112.5 kg 96.5 kg 101.2 kg     Body mass index is 37.71 kg/m.  General:  Well nourished, well  developed, in no acute distress HEENT: normal Neck: no JVD Vascular: No carotid bruits; Distal pulses 2+ bilaterally Cardiac:  normal S1, S2; RRR; no murmur  Lungs:  clear to auscultation bilaterally, no wheezing, rhonchi or rales  Abd: soft, nontender, no hepatomegaly  Ext: no edema Musculoskeletal:  No deformities, BUE and BLE strength normal and equal Skin: warm and dry  Neuro:  CNs 2-12 intact, no focal abnormalities noted Psych:  Normal affect   EKG:  The EKG was personally reviewed and demonstrates: Sinus rhythm Telemetry:  Telemetry was personally reviewed and demonstrates: Sinus rhythm  Relevant CV Studies: TTE  1. Left ventricular ejection fraction, by estimation, is 60 to 65%. The  left ventricle has normal function. The left ventricle has no regional  wall motion abnormalities. Left ventricular diastolic parameters were  normal.   2. Right ventricular systolic function is normal. The right ventricular  size is normal.   3. The mitral valve is normal in structure. No evidence of mitral valve  regurgitation. No evidence of mitral stenosis.   4. The aortic valve is normal in structure. Aortic valve regurgitation is  not visualized. No aortic stenosis is present.   5. The inferior vena cava is normal in size with greater than 50%  respiratory variability, suggesting right atrial pressure of 3 mmHg.   Laboratory Data: High Sensitivity Troponin:   Recent Labs  Lab 01/07/24 0128 01/07/24 0505  TROPONINIHS 3 4     Chemistry Recent Labs  Lab 01/08/24 0509 01/09/24 0427 01/10/24 0431  NA 136 138 138  K 4.1 4.3 4.8  CL 103 105 104  CO2 26 26 25   GLUCOSE 98 122* 83  BUN 13 10 9   CREATININE 0.74 0.71 0.79  CALCIUM   8.2* 8.7* 9.2  MG 1.9 1.9 2.2  GFRNONAA >60 >60 >60  ANIONGAP 7 7 9     Recent Labs  Lab 01/07/24 0128 01/08/24 0509 01/09/24 0427  PROT 7.5  --   --   ALBUMIN 3.8 2.9* 3.1*  AST 22  --   --   ALT 25  --   --   ALKPHOS 101  --   --   BILITOT  0.7  --   --    Lipids  Recent Labs  Lab 01/07/24 0127  CHOL 166  TRIG 272*  HDL 24*  LDLCALC 88  CHOLHDL 6.9    Hematology Recent Labs  Lab 01/07/24 0505 01/09/24 0427 01/10/24 0431  WBC 9.6 5.7 7.6  RBC 3.80* 3.61* 3.94  HGB 11.4* 10.8* 11.5*  HCT 33.8* 32.1* 35.7*  MCV 88.9 88.9 90.6  MCH 30.0 29.9 29.2  MCHC 33.7 33.6 32.2  RDW 12.7 12.2 12.3  PLT 310 240 304   Thyroid No results for input(s): TSH, FREET4 in the last 168 hours.  BNP Recent Labs  Lab 01/07/24 0127  BNP 8.0    DDimer No results for input(s): DDIMER in the last 168 hours.  Radiology/Studies:  ECHOCARDIOGRAM COMPLETE Result Date: 01/07/2024    ECHOCARDIOGRAM REPORT   Patient Name:   Breanna Mathews Date of Exam: 01/07/2024 Medical Rec #:  [id number]        Height:       68.0 in Accession #:    [id number]       Weight:       210.0 lb Date of Birth:  1986/03/27         BSA:          2.087 m Patient Age:    37 years         BP:           109/43 mmHg Patient Gender: F                HR:           66 bpm. Exam Location:  [facility] Procedure: 2D Echo, Cardiac Doppler and Color Doppler (Both Spectral and Color            Flow Doppler were utilized during procedure). Indications:     Ventricular Tachycardia I47.2  History:         Patient has no prior history of Echocardiogram examinations.  Sonographer:     Thedora Louder RDCS, FASE Referring Phys:  [id number] BRITTON L [place] Diagnosing Phys: Evalene Lunger MD  Sonographer Comments: Technically challenging study due to limited acoustic windows, suboptimal apical window, suboptimal parasternal window and no subcostal window. Image acquisition challenging due to patient body habitus and Image acquisition challenging due to respiratory motion. Unable to use Definity IV ultrasound imaging agent at this time, per RN IV meds cannot be paused to be administered. IMPRESSIONS  1. Left ventricular ejection fraction, by estimation, is 60 to 65%. The left ventricle has normal  function. The left ventricle has no regional wall motion abnormalities. Left ventricular diastolic parameters were normal.  2. Right ventricular systolic function is normal. The right ventricular size is normal.  3. The mitral valve is normal in structure. No evidence of mitral valve regurgitation. No evidence of mitral stenosis.  4. The aortic valve is normal in structure. Aortic valve regurgitation is not visualized. No aortic stenosis is present.  5. The inferior vena cava is normal in size with greater than 50% respiratory variability,  suggesting right atrial pressure of 3 mmHg. FINDINGS  Left Ventricle: Left ventricular ejection fraction, by estimation, is 60 to 65%. The left ventricle has normal function. The left ventricle has no regional wall motion abnormalities. Strain was performed and the global longitudinal strain is indeterminate. The left ventricular internal cavity size was normal in size. There is no left ventricular hypertrophy. Left ventricular diastolic parameters were normal. Right Ventricle: The right ventricular size is normal. No increase in right ventricular wall thickness. Right ventricular systolic function is normal. Left Atrium: Left atrial size was normal in size. Right Atrium: Right atrial size was normal in size. Pericardium: There is no evidence of pericardial effusion. Mitral Valve: The mitral valve is normal in structure. No evidence of mitral valve regurgitation. No evidence of mitral valve stenosis. Tricuspid Valve: The tricuspid valve is normal in structure. Tricuspid valve regurgitation is mild . No evidence of tricuspid stenosis. Aortic Valve: The aortic valve is normal in structure. Aortic valve regurgitation is not visualized. No aortic stenosis is present. Aortic valve peak gradient measures 6.5 mmHg. Pulmonic Valve: The pulmonic valve was normal in structure. Pulmonic valve regurgitation is not visualized. No evidence of pulmonic stenosis. Aorta: The aortic root is normal  in size and structure. Venous: The inferior vena cava is normal in size with greater than 50% respiratory variability, suggesting right atrial pressure of 3 mmHg. IAS/Shunts: No atrial level shunt detected by color flow Doppler. Additional Comments: 3D was performed not requiring image post processing on an independent workstation and was indeterminate.  LEFT VENTRICLE PLAX 2D LVIDd:         5.00 cm   Diastology LVIDs:         3.30 cm   LV e' medial:    11.30 cm/s LV PW:         0.90 cm   LV E/e' medial:  8.2 LV IVS:        0.90 cm   LV e' lateral:   12.80 cm/s LVOT diam:     2.00 cm   LV E/e' lateral: 7.2 LV SV:         70 LV SV Index:   33 LVOT Area:     3.14 cm  RIGHT VENTRICLE RV Basal diam:  3.40 cm RV S prime:     13.40 cm/s TAPSE (M-mode): 2.2 cm LEFT ATRIUM             Index        RIGHT ATRIUM           Index LA diam:        3.90 cm 1.87 cm/m   RA Area:     12.10 cm LA Vol (A2C):   45.7 ml 21.90 ml/m  RA Volume:   30.50 ml  14.62 ml/m LA Vol (A4C):   31.5 ml 15.10 ml/m LA Biplane Vol: 38.2 ml 18.31 ml/m  AORTIC VALVE                 PULMONIC VALVE AV Area (Vmax): 2.43 cm     PV Vmax:        1.11 m/s AV Vmax:        127.00 cm/s  PV Peak grad:   4.9 mmHg AV Peak Grad:   6.5 mmHg     RVOT Peak grad: 3 mmHg LVOT Vmax:      98.20 cm/s LVOT Vmean:     71.200 cm/s LVOT VTI:       0.222 m  AORTA Ao Root diam: 3.00 cm Ao Asc diam:  2.80 cm MITRAL VALVE               TRICUSPID VALVE MV Area (PHT): 3.19 cm    TR Peak grad:   25.0 mmHg MV Decel Time: 238 msec    TR Vmax:        250.00 cm/s MV E velocity: 92.80 cm/s MV A velocity: 66.60 cm/s  SHUNTS MV E/A ratio:  1.39        Systemic VTI:  0.22 m                            Systemic Diam: 2.00 cm Evalene Lunger MD Electronically signed by Evalene Lunger MD Signature Date/Time: 01/07/2024/10:16:19 AM    Final    US  Venous Img Lower Bilateral Result Date: 01/07/2024 CLINICAL DATA:  redness, swelling, pain in RLE, hx of recent surgery and extended recovery EXAM:  BILATERAL LOWER EXTREMITY VENOUS DOPPLER ULTRASOUND TECHNIQUE: Gray-scale sonography with graded compression, as well as color Doppler and duplex ultrasound were performed to evaluate the lower extremity deep venous systems from the level of the common femoral vein and including the common femoral, femoral, profunda femoral, popliteal and calf veins including the posterior tibial, peroneal and gastrocnemius veins when visible. The superficial great saphenous vein was also interrogated. Spectral Doppler was utilized to evaluate flow at rest and with distal augmentation maneuvers in the common femoral, femoral and popliteal veins. COMPARISON:  None Available. FINDINGS: RIGHT LOWER EXTREMITY Common Femoral Vein: No evidence of thrombus. Normal compressibility, respiratory phasicity and response to augmentation. Saphenofemoral Junction: No evidence of thrombus. Normal compressibility and flow on color Doppler imaging. Profunda Femoral Vein: No evidence of thrombus. Normal compressibility and flow on color Doppler imaging. Femoral Vein: No evidence of thrombus. Normal compressibility, respiratory phasicity and response to augmentation. Popliteal Vein: No evidence of thrombus. Normal compressibility, respiratory phasicity and response to augmentation. Calf Veins: No evidence of thrombus. Normal compressibility and flow on color Doppler imaging. LEFT LOWER EXTREMITY Common Femoral Vein: No evidence of thrombus. Normal compressibility, respiratory phasicity and response to augmentation. Saphenofemoral Junction: No evidence of thrombus. Normal compressibility and flow on color Doppler imaging. Profunda Femoral Vein: No evidence of thrombus. Normal compressibility and flow on color Doppler imaging. Femoral Vein: No evidence of thrombus. Normal compressibility, respiratory phasicity and response to augmentation. Popliteal Vein: No evidence of thrombus. Normal compressibility, respiratory phasicity and response to augmentation.  Calf Veins: No evidence of thrombus. Normal compressibility and flow on color Doppler imaging. IMPRESSION: No evidence of deep venous thrombosis in either lower extremity. Electronically Signed   By: Gilmore GORMAN Molt M.D.   On: 01/07/2024 02:30   DG Chest Port 1 View Result Date: 01/07/2024 CLINICAL DATA:  Chest pain x1 day. EXAM: PORTABLE CHEST 1 VIEW COMPARISON:  Nov 16, 2019 FINDINGS: The heart size and mediastinal contours are within normal limits. Both lungs are clear. Postoperative changes are seen within the lower thoracic spine. The visualized skeletal structures are otherwise unremarkable. IMPRESSION: No active disease. Electronically Signed   By: Suzen Dials M.D.   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  Was initially on amiodarone  and lidocaine  which has been switched to mexiletine 150 mg twice daily.  In review of telemetry, her PVCs appear to be outflow tract.  She did have multiple runs of nonsustained VT with PVCs of similar morphology.  Since starting her mexiletine, she has not had these episodes.  She had neck surgery approximately 10 days ago and her palpitations started after surgery.  It is certainly possible that inflammation from surgery has exacerbated her ventricular arrhythmias.  As she has not had any further PVCs on her current medications, would plan for discharge on mexiletine 150 mg 3 times daily.  Bentzion Dauria arrange for follow-up in clinic.   Risk Assessment/Risk Scores:        [facility] Veva Grimley sign off.   The patient is ready for discharge today from a cardiac standpoint. Medication Recommendations: Mexiletine 150 mg 3 times daily Other recommendations (labs, testing, etc):   Follow up as an outpatient: Evie Croston be arranged in EP clinic  For questions or updates, please contact [facility] Please consult [email or web address] for contact info under    Signed, Berea Majkowski Gladis Norton, MD  01/10/2024 11:03 AM

## 2024-01-10 NOTE — Progress Notes (Signed)
 Patient being discharged home. Family to pick her up. Discharge instructions went over with patient. Appointment scheduled with Dr. Beecher. Patient will call PCP to follow up. At this time patient requesting prescription for Ativan . Discharging MD does not prescribe and recommended follow up with PCP. Patient has all belongings.

## 2024-01-10 NOTE — Discharge Summary (Signed)
 Physician Discharge Summary  Patient ID: Breanna Mathews MRN: [id number] DOB/AGE: 40-Feb-1987 40 y.o.  Admit date: 01/07/2024 Discharge date: 01/10/2024  Admission Diagnoses:  Discharge Diagnoses:  Principal Problem:   Ventricular tachycardia [facility])   Discharged Condition: stable  Hospital Course:   History of Present Illness:  38 yo F presenting to [facility] ED from home via EMS for evaluation of chest pain.   History provided per chart review and patient bedside report. Patient describes being in her normal state of health until a few days ago when her heart started to feel weird. She describes this as intermittent 6/10 chest pain that aches with pressure on the right-medial side of her chest. She also describes palpitations with correlating vision changes where she sees black spots. No rhyme or reason to these events that she has noticed. At her post-op appt last week, someone noted that her HR was bouncing around between 20 and 120, but her BP was stable. Around 22:00 on 7/3 she describes persistent chest pain 6/10 unable to get comfortable, pressure/ squeezing internally, constant ache. She does endorse a headache and some dyspnea earlier when laying on her back- but otherwise no shortness of breath. Only other pain is at her surgical site, site is clean dry and intact and she has not noticed any drainage or redness. She does have recent RLE edema and redness with discomfort- unclear because initially reported pain, then denied pain in leg. She has had some re-occurring RLE edema which she is on lasix for. She denies falls, LOC, cough, fever/chills, abdominal pain/ nausea/ vomiting/ diarrhea or dizziness. She has been taking all her medication as prescribed including a significant chronic pain medication regimen.    ED course: Upon arrival patient alert and responsive with self-sustained runs of Vtach, transitioning in and out of bigeminy/ trigeminy with underlying NSR. Amiodarone  drip  started. Las largely unremarkable with mild leukocytosis. Cardiology consulted due to difficulty controlling Vtach and concern for increased frequency since arrival. Recommendation for lidocaine  bolus and drip in addition to amiodarone  drip.  Medications given: amiodarone  drip & 1 L IVF bolus Initial Vitals: 98.2, 16, 50, 100/49 & 94% on RA EKG Interpretation: Date: 01/07/24, EKG Time: 01:19, Rate: 108, Rhythm: ST, QRS Axis:  normal, Intervals: borderline prolonged PR, ST/T Wave abnormalities: none, Narrative Interpretation: ST with bigeminy Chemistry: Na+: 136, K+: 3.7, BUN/Cr.: 18/ 0.88, Serum CO2/ AG: 27/ 11, Mg: 2.1 Hematology: WBC: 11.1, Hgb: 13.3,  Troponin: 3, BNP: 8, Lactic/ PCT: 1.8/ <0.10,    CXR 01/07/24: no active disease BLE Venous US  01/07/24: negative for DVT   PCCM consulted for admission due to persistent Ventricular Tachycardia on amiodarone  drip.     Pertinent  Medical History  Cervical Stenosis s/p C4 corpectomy & C5-6, C6-7 ACDF (12/2023) Bipolar 1 disorder Anxiety & depression Chronic Migraine Seizure disorder (not on AED- remote history) HTN Lupus T2DM insulin  dependent Every day smoker (30 pack history) Chronic pain     Significant Hospital Events: Including procedures, antibiotic start and stop dates in addition to other pertinent events   01/07/24: Admit to ICU with persistent Ventricular Tachycardia on amiodarone . Switched to Lidocaine  drip.  01/08/2024: Patient transitioned to Mexiletine, remained stable.  EP CONSULTATION PVCs: Appear to be outflow tract.  Was initially on amiodarone  and lidocaine  which has been switched to mexiletine 150 mg twice daily.  In review of telemetry, her PVCs appear to be outflow tract.  She did have multiple runs of nonsustained VT with PVCs of similar morphology.  Since  starting her mexiletine, she has not had these episodes.  She had neck surgery approximately 10 days ago and her palpitations started after surgery.  It is certainly  possible that inflammation from surgery has exacerbated her ventricular arrhythmias.  As she has not had any further PVCs on her current medications, would plan for discharge on mexiletine 150 mg 3 times daily.  Will arrange for follow-up in clinic.     Consults: cardiology and pulmonary/intensive care    Discharge Exam: Blood pressure 115/64, pulse 81, temperature 98.4 F (36.9 C), resp. rate 18, height 5' 8 (1.727 m), weight 112.5 kg, SpO2 98%, unknown if currently breastfeeding.     Allergies as of 01/10/2024       Reactions   Ondansetron  Other (See Comments)   Prolonged QT syndrome - must monitor ECG if patient is taking this medication   Buspirone Other (See Comments)   Gabapentin  Other (See Comments)   Pt states it makes seizures worse per pt   Keflex [cephalexin] Rash   Lithium Palpitations   Seizures    Opana [oxymorphone Hcl] Palpitations   Oxymorphone Other (See Comments)        Medication List     TAKE these medications    buprenorphine  8 MG Subl SL tablet Commonly known as: SUBUTEX  Place 8 mg under the tongue 2 (two) times daily.   Buprenorphine  HCl-Naloxone  HCl 8-2 MG Film Place 1 Film under the tongue daily. Takes at lunch   buPROPion  150 MG 24 hr tablet Commonly known as: WELLBUTRIN  XL Take 1 tablet (150 mg total) by mouth daily. Start taking on: January 11, 2024   carisoprodol  350 MG tablet Commonly known as: SOMA  Take 1 tablet by mouth 3 (three) times daily.   Cymbalta  60 MG capsule Generic drug: DULoxetine  Take 60 mg by mouth daily. What changed: Another medication with the same name was added. Make sure you understand how and when to take each.   DULoxetine  60 MG capsule Commonly known as: CYMBALTA  Take 1 capsule (60 mg total) by mouth daily. Start taking on: January 11, 2024 What changed: You were already taking a medication with the same name, and this prescription was added. Make sure you understand how and when to take each.    diphenhydrAMINE  50 MG capsule Commonly known as: BENADRYL  Take 50 mg by mouth 3 (three) times daily.   Levemir FlexPen 100 UNIT/ML FlexPen Generic drug: insulin  detemir Inject into the skin.   LORazepam  0.5 MG tablet Commonly known as: ATIVAN  Take 0.5 mg by mouth every 6 (six) hours as needed.   metFORMIN 500 MG 24 hr tablet Commonly known as: GLUCOPHAGE-XR Take 500 mg by mouth 2 (two) times daily.   mexiletine 150 MG capsule Commonly known as: MEXITIL  Take 1 capsule (150 mg total) by mouth 3 (three) times daily.   nicotine  21 mg/24hr patch Commonly known as: NICODERM CQ  - dosed in mg/24 hours Place 1 patch (21 mg total) onto the skin daily.   olmesartan -hydrochlorothiazide  40-25 MG tablet Commonly known as: BENICAR  HCT Take 1 tablet by mouth daily.   sucralfate 1 g tablet Commonly known as: CARAFATE Take 1 g by mouth 4 (four) times daily.   SUMAtriptan 100 MG tablet Commonly known as: IMITREX Take 100 mg by mouth once.   topiramate 25 MG tablet Commonly known as: TOPAMAX Take 25 mg by mouth daily.         Signed: Nickolas Cellar 01/10/2024, 12:17 PM

## 2024-01-10 NOTE — Consult Note (Signed)
 PHARMACY CONSULT NOTE - FOLLOW UP  Pharmacy Consult for Electrolyte Monitoring and Replacement   Recent Labs: Potassium (mmol/L)  Date Value  01/10/2024 4.8   Magnesium  (mg/dL)  Date Value  92/92/7974 2.2   Calcium  (mg/dL)  Date Value  92/92/7974 9.2   Albumin (g/dL)  Date Value  92/93/7974 3.1 (L)   Phosphorus (mg/dL)  Date Value  92/93/7974 3.5   Sodium (mmol/L)  Date Value  01/10/2024 138     Assessment: 38 yo F presenting to [facility] ED from home via EMS for evaluation of chest pain. She has had some re-occurring RLE edema. Pt had persistent ventricular tachycardia. Pharmacy is asked to follow and replace electrolytes while in CCU  Goal of Therapy:  Potassium 4.0 - 5.1 mmol/L Magnesium  2.0 - 2.4 mg/dL All Other Electrolytes WNL   Plan:  No replacement needed. Patient transferred from the ICU. Pharmacy will sign off. Please re-consult if needed.     Breanna Mathews ,PharmD Clinical Pharmacist 01/10/2024 8:34 AM

## 2024-01-26 NOTE — Progress Notes (Deleted)
 Electrophysiology Clinic Note    Date:  01/26/2024  Patient ID:  Breanna Mathews, DOB [birth year], MRN [id number] PCP:  Amil Sovereign, MD  Cardiologist:  Evalene Lunger, MD   Electrophysiologist:  Soyla Gladis Norton, MD  Electrophysiology APP:  Alayha Babineaux, NP    ***refresh  Discussed the use of AI scribe software for clinical note transcription with the patient, who gave verbal consent to proceed.   Patient Profile    Chief Complaint: hospital follow-up  History of Present Illness: Breanna Mathews is a 38 y.o. female with PMH notable for VT, PVCs, cervical stenosis, bipolar disorder, anxiety, depression, seizures, HTN, SLE< T2DM, tobacco use; seen today for Will Gladis Norton, MD for post hospital follow up.    She presented to [facility] 01/07/2024 with chest pain and felt her heart racing. She was in and out of VT with frequent PVCs. She was initially started on amiodarone  and lidocaine , switching to mexiletine with arrhythmia remaining quiescent. EP consulted and thought her ventricular arrhythmia possibly related to increased inflammation after recent cervical spine surgery.    ON follow-up today, *** palps, chest pain *** how taking mexiletine   Since discharge from hospital the patient reports doing ***.  she denies chest pain, palpitations, dyspnea, PND, orthopnea, nausea, vomiting, dizziness, syncope, edema, weight gain, or early satiety.      Arrhythmia/Device History No specialty comments available.    ROS:  Please see the history of present illness. All other systems are reviewed and otherwise negative.    Physical Exam    VS:  There were no vitals taken for this visit. BMI: There is no height or weight on file to calculate BMI.      Wt Readings from Last 3 Encounters:  01/10/24 248 lb 0.3 oz (112.5 kg)  07/23/22 224 lb 12.8 oz (102 kg)  02/28/22 236 lb (107 kg)     GEN- The patient is well appearing, alert and oriented x 3 today.   Lungs-  Clear to ausculation bilaterally, normal work of breathing.  Heart- {Blank single:19197::Regular,Irregularly irregular} rate and rhythm, no murmurs, rubs or gallops Extremities- {EDEMA LEVEL:28147::No} peripheral edema, warm, dry    Studies Reviewed   Previous EP, cardiology notes.    EKG is ordered. Personal review of EKG from today shows:  ***        TTE, 01/07/2024  1. Left ventricular ejection fraction, by estimation, is 60 to 65%. The left ventricle has normal function. The left ventricle has no regional wall motion abnormalities. Left ventricular diastolic parameters were normal.   2. Right ventricular systolic function is normal. The right ventricular size is normal.   3. The mitral valve is normal in structure. No evidence of mitral valve regurgitation. No evidence of mitral stenosis.   4. The aortic valve is normal in structure. Aortic valve regurgitation is not visualized. No aortic stenosis is present.   5. The inferior vena cava is normal in size with greater than 50% respiratory variability, suggesting right atrial pressure of 3 mmHg.      Assessment and Plan     #) PVCs #) VT #) NSVT ***   #) ***   {Are you ordering a CV Procedure (e.g. stress test, cath, DCCV, TEE, etc)?   Press F2        :[id number]}   Current medicines are reviewed at length with the patient today.   The patient {ACTIONS; HAS/DOES NOT HAVE:19233} concerns regarding her medicines.  The following changes were made today:  {  NONE DEFAULTED:18576}  Labs/ tests ordered today include: *** No orders of the defined types were placed in this encounter.    Disposition: Follow up with {EPMDS:28135::EP Team} or EP APP {EPFOLLOW UP:28173}   Signed, Chantal Needle, NP  01/26/24  4:22 PM  Electrophysiology [facility]

## 2024-01-27 ENCOUNTER — Ambulatory Visit: Attending: Cardiology | Admitting: Cardiology

## 2024-01-27 DIAGNOSIS — I493 Ventricular premature depolarization: Secondary | ICD-10-CM

## 2024-01-27 DIAGNOSIS — I472 Ventricular tachycardia, unspecified: Secondary | ICD-10-CM

## 2024-03-20 ENCOUNTER — Ambulatory Visit: Attending: Cardiology | Admitting: Cardiology

## 2024-03-20 NOTE — Progress Notes (Signed)
 [facility]  Care Management -  Follow-Up Engagement  Purpose: A [facility] completed a phone call to address follow-up care needs.  Breanna Mathews is being managed for the following: Care Coordination. Patient Reports: The patient states she is doing her best. She states she has had to cancel a visit due to not feeling well. Assessment and Intervention of Needs: [facility] CM reviewed upcoming visits.  CM will send another message to providers discussing her previously stated concerns.  Plan: Breanna Mathews has agreed to participate with the identified care plan that was discussed on 03/20/2024. Please see Plan of Care in the Notes section of Chart Review Next follow up appointment date: 03/27/24 MONICA T ELLIOTT    [address]; [place], [place] [address] l  [email or web address] l [phone]   For more information on [facility] services, click here.

## 2024-03-20 NOTE — Progress Notes (Deleted)
 Cardiology Office Note   Date:  03/20/2024  ID:  Breanna Mathews, DOB April 21, 1986, MRN [id number] PCP: Amil Sovereign, MD   HeartCare Providers Cardiologist:  Evalene Lunger, MD Electrophysiologist:  Soyla Gladis Norton, MD  Electrophysiology APP:  Riddle, Suzann, NP { Click to update primary MD,subspecialty MD or APP then REFRESH:1}    History of Present Illness Breanna Mathews is a 38 y.o. female with a past medical history of cervical stenosis status post C4 corpectomy and C5-6, C6-7 ACDF (12/2023), bipolar disorder, anxiety and depression, chronic migraine, seizure disorder, hypertension, lupus, insulin -dependent type 2 diabetes, current smoker, chronic pain, who was recently evaluated during hospitalization(7/25) for nonsustained V. tach, who is here today to follow-up.   She was previously hospitalized at [facility] from 7/4-7/72025 with complaints of chest pain.  And she stated that her heart started to feel like it was racing.  This is accompanied by intermittent chest pain rated 6 out of 10 on the pain scale.  She states she had pressure to the right medial side of her chest.  She also had palpitations with her vision changes where she was seeing black spots.  She stated she was 10 days postop from neck surgery at that time.  She said that her postoperative appointment the week prior someone noted her heart rate was bouncing between 20 and 120 but her blood pressure was stable.  Initial blood pressure 139/60, pulse 49, respiration of 13, temperature 98.2.  Pertinent labs revealed hemoglobin A1c of 6.4, WBCs of 11, platelets 403, blood glucose 140, triglycerides 272, HDL 24, LDL 54, hemoglobin of 11.4, potassium 3.4, blood glucose 108, calcium  8.5.  She was placed on the cardiac monitor and develop more PVCs developing the bigeminy and trigeminy admitting and then nonsustained V. tach.  She was started on amiodarone  after bolus, she was then switched to lidocaine  drip.  Echocardiogram  completed revealing LVEF 60 to 65%, no RWMA, and no valvular abnormalities were noted.  She was seen and evaluated by EP who opted to discontinue amlodipine and start her on mexiletine 150 mg twice daily.  Review of telemetry PVCs appear to be outflow track.  She did have multiple runs of nonsustained V. tach with PVCs and similar morphology.  Once that she started her mexiletine she had not had any recurrent episodes.  With the morphology of her PVCs mexiletine was increased to 150 mg 3 times daily and she was to have outpatient follow-up with EP.   She returns to clinic today   ROS: 10 point review of systems has been reviewed and considered negative the exception was been listed in the HPI  Studies Reviewed     2d echo 01/07/2024 1. Left ventricular ejection fraction, by estimation, is 60 to 65%. The  left ventricle has normal function. The left ventricle has no regional  wall motion abnormalities. Left ventricular diastolic parameters were  normal.   2. Right ventricular systolic function is normal. The right ventricular  size is normal.   3. The mitral valve is normal in structure. No evidence of mitral valve  regurgitation. No evidence of mitral stenosis.   4. The aortic valve is normal in structure. Aortic valve regurgitation is  not visualized. No aortic stenosis is present.   5. The inferior vena cava is normal in size with greater than 50%  respiratory variability, suggesting right atrial pressure of 3 mmHg.   Risk Assessment/Calculations   No BP recorded.  {Refresh Note OR Click here to enter BP  :  1}***       Physical Exam VS:  There were no vitals taken for this visit.       Wt Readings from Last 3 Encounters:  01/10/24 248 lb 0.3 oz (112.5 kg)  07/23/22 224 lb 12.8 oz (102 kg)  02/28/22 236 lb (107 kg)    GEN: Well nourished, well developed in no acute distress NECK: No JVD; No carotid bruits CARDIAC: ***RRR, no murmurs, rubs, gallops RESPIRATORY:  Clear to  auscultation without rales, wheezing or rhonchi  ABDOMEN: Soft, non-tender, non-distended EXTREMITIES:  No edema; No deformity   ASSESSMENT AND PLAN PVCs/nonsustained VT/VT Hypertension Current everyday smoker with no cardiovascular history    {Are you ordering a CV Procedure (e.g. stress test, cath, DCCV, TEE, etc)?   Press F2        :[id number]}  Dispo: ***  Signed, Damarea Merkel, NP
